# Patient Record
Sex: Male | Born: 1950
Health system: Southern US, Community
[De-identification: ages and names within clinical notes are randomized; demographics above are authoritative.]

## PROBLEM LIST (undated history)

## (undated) ENCOUNTER — Emergency Department (HOSPITAL_COMMUNITY): Admission: EM | Discharge status: Absent without leave | Payer: Medicare HMO

## (undated) DIAGNOSIS — N183 Chronic kidney disease, stage 3 unspecified: Secondary | ICD-10-CM

## (undated) DIAGNOSIS — K852 Alcohol induced acute pancreatitis without necrosis or infection: Secondary | ICD-10-CM

## (undated) DIAGNOSIS — K219 Gastro-esophageal reflux disease without esophagitis: Secondary | ICD-10-CM

## (undated) DIAGNOSIS — I10 Essential (primary) hypertension: Secondary | ICD-10-CM

## (undated) DIAGNOSIS — F17200 Nicotine dependence, unspecified, uncomplicated: Secondary | ICD-10-CM

## (undated) DIAGNOSIS — E118 Type 2 diabetes mellitus with unspecified complications: Secondary | ICD-10-CM

## (undated) DIAGNOSIS — K118 Other diseases of salivary glands: Secondary | ICD-10-CM

## (undated) DIAGNOSIS — K859 Acute pancreatitis without necrosis or infection, unspecified: Secondary | ICD-10-CM

## (undated) HISTORY — PX: LAPAROSCOPIC GASTROTOMY W/ REPAIR OF ULCER: SUR772

---

## 1999-05-31 ENCOUNTER — Ambulatory Visit (HOSPITAL_BASED_OUTPATIENT_CLINIC_OR_DEPARTMENT_OTHER): Admission: RE | Admit: 1999-05-31 | Discharge: 1999-05-31 | Payer: Self-pay | Admitting: Urology

## 2004-09-11 ENCOUNTER — Emergency Department (HOSPITAL_COMMUNITY): Admission: EM | Admit: 2004-09-11 | Discharge: 2004-09-11 | Payer: Self-pay | Admitting: Emergency Medicine

## 2004-09-29 ENCOUNTER — Ambulatory Visit (HOSPITAL_COMMUNITY): Admission: RE | Admit: 2004-09-29 | Discharge: 2004-09-29 | Payer: Self-pay | Admitting: Internal Medicine

## 2004-10-07 ENCOUNTER — Ambulatory Visit: Payer: Self-pay | Admitting: Internal Medicine

## 2004-10-11 ENCOUNTER — Ambulatory Visit: Payer: Self-pay | Admitting: Internal Medicine

## 2004-10-11 ENCOUNTER — Inpatient Hospital Stay (HOSPITAL_COMMUNITY): Admission: EM | Admit: 2004-10-11 | Discharge: 2004-10-25 | Payer: Self-pay | Admitting: Emergency Medicine

## 2004-10-12 HISTORY — PX: ESOPHAGOGASTRODUODENOSCOPY: SHX1529

## 2004-10-16 ENCOUNTER — Ambulatory Visit: Payer: Self-pay | Admitting: Internal Medicine

## 2004-10-24 ENCOUNTER — Ambulatory Visit: Payer: Self-pay | Admitting: Internal Medicine

## 2004-11-03 ENCOUNTER — Inpatient Hospital Stay (HOSPITAL_COMMUNITY): Admission: EM | Admit: 2004-11-03 | Discharge: 2004-11-05 | Payer: Self-pay | Admitting: *Deleted

## 2004-11-10 ENCOUNTER — Ambulatory Visit: Payer: Self-pay | Admitting: Internal Medicine

## 2005-01-05 ENCOUNTER — Ambulatory Visit: Payer: Self-pay | Admitting: Internal Medicine

## 2005-01-27 ENCOUNTER — Ambulatory Visit: Payer: Self-pay | Admitting: Internal Medicine

## 2005-01-27 ENCOUNTER — Ambulatory Visit (HOSPITAL_COMMUNITY): Admission: RE | Admit: 2005-01-27 | Discharge: 2005-01-27 | Payer: Self-pay | Admitting: Internal Medicine

## 2005-01-27 HISTORY — PX: COLONOSCOPY: SHX174

## 2005-02-24 ENCOUNTER — Ambulatory Visit (HOSPITAL_COMMUNITY): Admission: RE | Admit: 2005-02-24 | Discharge: 2005-02-24 | Payer: Self-pay | Admitting: Internal Medicine

## 2006-04-17 ENCOUNTER — Ambulatory Visit (HOSPITAL_COMMUNITY): Admission: RE | Admit: 2006-04-17 | Discharge: 2006-04-17 | Payer: Self-pay | Admitting: Internal Medicine

## 2007-04-01 ENCOUNTER — Emergency Department (HOSPITAL_COMMUNITY): Admission: EM | Admit: 2007-04-01 | Discharge: 2007-04-01 | Payer: Self-pay | Admitting: Emergency Medicine

## 2007-08-15 ENCOUNTER — Ambulatory Visit (HOSPITAL_COMMUNITY): Admission: RE | Admit: 2007-08-15 | Discharge: 2007-08-15 | Payer: Self-pay | Admitting: Orthopaedic Surgery

## 2011-02-17 NOTE — Consult Note (Signed)
NAMEEILEEN, GAGEN                ACCOUNT NO.:  [id number]   MEDICAL RECORD NO.:  [id number]           PATIENT TYPE:   LOCATION:                                 FACILITY:   PHYSICIAN:  R. Garfield Cornea, M.D. DATE OF BIRTH:  [birth year]   DATE OF CONSULTATION:  10/07/2004  DATE OF DISCHARGE:                                   CONSULTATION   CONSULTING PHYSICIAN:  R. Garfield Cornea, M.D.   REFERRING PHYSICIAN:  Tesfaye D. Legrand Rams, M.D.   REASON FOR CONSULTATION:  Abdominal pain, history of peptic ulcer disease.   HISTORY OF PRESENT ILLNESS:  The patient is a 60 year old black male who  presents today for further evaluation of the above stated symptoms.  He has  a long history of intermittent epigastric pain.  For the last month or so,  he has had worsening pain that has become daily.  He denies any modifying or  alleviating factors.  He recalls being on high dose Tagamet in the past  which seemed to help.  He was diagnosed with a peptic ulcer back in the  1990s, by Dr. Glory Buff, in [place], who has since retired, per the patient.  He denies any vomiting.  He does have occasional nausea.  He denies any  heartburn, indigestion, dysphagia, odynophagia.  His bowels move fairly  regularly.  The last couple of days they have been on the loose side.  His  stools were black recently when he took Pepto-Bismol.  They have since been  brown in color.  He occasionally sees bright red blood per rectum.  He has  never had a colonoscopy.  He has lost over 10 pounds in the last three  weeks.  He also complains of urinary frequency and nocturia.  He denies any  dysuria or gross hematuria.  Recently he was seen in the ED.  He had normal  CBC, LFTs, and lipase.  His urinalysis revealed trace blood.  His random  glucose was 138.  He has had an abdominal ultrasound which was negative.   CURRENT MEDICATIONS:  1.  Hyoscyamine 0.125 mg t.i.d.  2.  Prilosec OTC p.r.n.  3.  Tagamet p.r.n.  4.  Promethazine  25 mg p.r.n.  5.  Hydrocodone/APAP 5/500 p.r.n.  6.  Excedrin 2-3 times weekly p.r.n. migraine headaches.   ALLERGIES:  No known drug allergies.   PAST MEDICAL HISTORY:  1.  Remote history of peptic ulcer disease.  2.  Migraine headaches.   PAST SURGICAL HISTORY:  He states he had a cyst removed from his left  lower quadrant region.  On examination, he has a vertical incision in the  midline.   FAMILY HISTORY:  Negative for chronic GI illnesses, colorectal cancer.  Mother has diabetes mellitus.   SOCIAL HISTORY:  He is married, has four children.  He is employed with [employer].  He smokes one and a half packs of cigarettes daily.  He drinks  alcohol daily.  He is drinking 2 pints of wine each day.  He denies any  current illicit drug use but has smoked  marijuana in the past.   REVIEW OF SYSTEMS:  See HPI for GI.  CARDIOPULMONARY:  Denies any chest pain  or shortness of breath.  See HPI for GU.   PHYSICAL EXAMINATION:  VITAL SIGNS:  Weight 192.5, temp 98.1, blood pressure  138/90, pulse 84.  GENERAL:  A pleasant, well nourished, well developed, black male in no acute  distress.  SKIN:  Warm and dry, no jaundice.  HEENT:  Pupils equal, round, reactive to light.  Conjunctivae are pink.  Sclerae nonicteric.  Oropharyngeal mucosa moist and pink, no lesions,  erythema, or exudate.  He has palpable soft tissue mass in the left neck  region just underneath the jaw line, near the left ear.  He has similar  findings on the right, however, definitely enlarged on the left, is tender  to palpation, questionable lymph node versus parotid gland, or other.  CHEST:  Lungs are clear to auscultation.  CARDIAC:  Reveals a regular rate and rhythm.  Normal S1 S2.  No murmurs,  rubs or gallops.  ABDOMEN:  Positive bowel sounds.  Soft, nondistended.  He has mild  epigastric tenderness to deep palpation.  He has a well healed midline  incisional scar.  No organomegaly or masses, although liver  edge is palpable  just below the right costal margin.  The midclavicular line is smooth and  nontender.  No abdominal hernias.  EXTREMITIES:  No edema.   IMPRESSION:  1.  The patient is a 60 year old gentleman with a history of peptic ulcer      disease who presents with a 1-2 month history of worsening epigastric      pain with intermittent nausea and weight loss.  He consumes a moderate      amount of Excedrin as well as a significant amount of alcohol daily.      Would be concerned about recurrent peptic ulcer disease.  In addition,      he has intermittent hematochezia which may be due to a benign anorectal      source.  He needs to have a colonoscopy for further evaluation.  2.  He also complains of polyuria, nocturia.  It was noted that he had trace      blood on recent urinalysis as well as elevated random blood glucose of      138.  Given family history, need to consider diabetes mellitus as well.   PLAN:  1.  EGD and colonoscopy in the near future.  I discussed the risks,      alternatives, and benefits with regards to possibilities of reactions to      medications, bleeding, infection, or perforation with the patient, and      he is agreeable to proceed.  2.  Urinalysis.  3.  Fasting blood glucose.     Lesl  LL/MEDQ  D:  10/07/2004  T:  10/07/2004  Job:  [id number]   cc:   Tesfaye D. Legrand Rams, [address]  [place]  [place] [address]  Fax: [phone]

## 2011-02-17 NOTE — Group Therapy Note (Signed)
NAMESTEDMAN, TORI                ACCOUNT NO.:  [id number]   MEDICAL RECORD NO.:  [id number]          PATIENT TYPE:  INP   LOCATION:  A308                          FACILITY:  [facility]   PHYSICIAN:  Estill Bamberg. Karie Kirks, M.D.DATE OF BIRTH:  [birth year]   DATE OF PROCEDURE:  DATE OF DISCHARGE:                                   PROGRESS NOTE   SUBJECTIVE:  He really says he is doing pretty well.  He really has not had  much pain.  He did take some IV Dilaudid today, but it is really more to  prevent pain later on, because he is not having any real pain now.  He has  just started the clear liquids and is tolerating them.   OBJECTIVE:  VITAL SIGNS:  Temperature 98.2, pulse 82, respiratory rate 18,  blood pressure 132/74.  GENERAL APPEARANCE:  He is semirecumbent in bed, in no acute distress, well  developed, well nourished, oriented and alert.  HEART:  Regular rhythm without murmur, rate of 80.  LUNGS:  Clear throughout, moving air well.  ABDOMEN:  Soft without hepatosplenomegaly or mass.  Just minimal epigastric  discomfort.  EXTREMITIES:  There is no edema in the ankles.   Today the white cell count is 11,600, H&H 15.2 and 43.9.  Met-7 shows a  sodium of 129, glucose 103.  Amylase was 628, up from 578 yesterday.  Lipase  127, down from 142 yesterday.   ASSESSMENT:  Pancreatitis.   PLAN:  Reviewed the note from Dr. Melony Overly, gastroenterologist.  He is just  being started on clear liquids today and off the ice chips.  If he continues  to do well, he will be able to be advanced on his diet and discharged.  Otherwise Dr. Melony Overly makes note that he may need to be transferred to a  tertiary care center.      SDK/MEDQ  D:  10/16/2004  T:  10/16/2004  Job:  [id number]

## 2011-02-17 NOTE — Discharge Summary (Signed)
Dylan Cline, Dylan Cline                ACCOUNT NO.:  [id number]   MEDICAL RECORD NO.:  [id number]          PATIENT TYPE:  INP   LOCATION:  A321                          FACILITY:  [facility]   PHYSICIAN:  Tesfaye D. Legrand Rams, MD   DATE OF BIRTH:  04-02-1951   DATE OF ADMISSION:  11/02/2004  DATE OF DISCHARGE:  02/04/2006LH                                 DISCHARGE SUMMARY   DISCHARGE DIAGNOSES:  1.  Chronic pancreatitis.  2.  History of peptic ulcer disease.  3.  Hypertension.   DISCHARGE MEDICATIONS:  1.  Protonix 40 mg p.o. daily.  2.  Pancrease 3 tablets p.o. t.i.d. with meals.  3.  Lisinopril 10 mg p.o. daily.   DISPOSITION:  The patient was discharged to home in stable condition.   HOSPITAL COURSE:  This is a 60 year old male patient who came to the  emergency room due to recurrent abdominal pain and nausea and vomiting.  The  patient was recently seen by a gastroenterologist and was planned for a CT  scan of the abdomen.  Following the CT scan test the patient started having  more symptoms of nausea and vomiting.  He had severe abdominal pain.  His CT  scan as an outpatient showed an ill-defined pancreatic mass.  He was  admitted for further evaluation.  During the hospital stay his liver  function, amylase, and lipase was slightly elevated.  He had a persistent  pancreatic mass.  A repeat CT scan was done and it showed some increase in  the size of the mass.  He was then referred to [facility] for an  endoscopic ultrasound.  He was evaluated at [facility] and a  pancreatic cyst was aspirated.  The patient improved after the procedure.  His pain was much better.  His enzymes came into normal limits.  The patient  was then discharged home to continue his regular treatment.      TDF/MEDQ  D:  11/23/2004  T:  11/23/2004  Job:  [id number]

## 2011-02-17 NOTE — H&P (Signed)
NAMEGERMAN, CHAKRABORTY                ACCOUNT NO.:  [id number]   MEDICAL RECORD NO.:  [id number]          PATIENT TYPE:  INP   LOCATION:  A321                          FACILITY:  [facility]   PHYSICIAN:  Tesfaye D. Legrand Rams, MD   DATE OF BIRTH:  Jul 10, 1951   DATE OF ADMISSION:  11/02/2004  DATE OF DISCHARGE:  LH                                HISTORY & PHYSICAL   CHIEF COMPLAINT:  Abdominal pain, nausea and vomiting.   HISTORY OF PRESENT ILLNESS:  This is a 60 year old male patient, known case  of chronic pancreatitis and hypertension, who came to emergency room with  above complaints.  The patient was discharged from this hospital about a  week ago after he was treated for exacerbation of pancreatitis.  During the  hospital stay, the patient had recurrent nausea and vomiting and abdominal  pain.  His CT scan showed pancreatic mass.  The patient was referred to  [facility], where EUS was done and the mass was aspirated.  His  amylase and lipase improved and he was clinically better.  The patient was  discharged to home to continue his treatment on follow-up.  The patient was  advised to stop alcohol.  Since the discharge, the patient felt better for  the first three days.  He then started having intermittent abdominal pain.  Yesterday the patient had a unbearable pain the whole day.  He was vomiting  several times.  The patient was then brought back to emergency room and was  started on IV fluid and IV pain medications.  The patient was admitted for  further treatment.  The patient denies any alcohol since he was discharged  from the hospital.   PAST MEDICAL HISTORY:  1.  Pancreatitis.  2.  Hypertension.  3.  Migraine headache.  4.  History of peptic ulcer disease.   CURRENT MEDICATIONS:  1.  Lisinopril 10 mg daily.  2.  Lortab 5/500 mg one tablet p.o. q.6h. p.r.n.  3.  Levsin 0.125 mg one tablet p.o. q.6h. p.r.n.   SOCIAL HISTORY:  The patient is married.  He has a history of  alcohol and  tobacco use.  No history of substance abuse.   PHYSICAL EXAMINATION:  GENERAL:  The patient is alert, awake and sick-  looking.  VITAL SIGNS:  Blood pressure 144/86, pulse 112, respiratory rate 18,  temperature 98.4 degrees Fahrenheit.  HEENT:  Pupils are equal, reactive.  NECK:  Supple.  CHEST:  Decreased air entry, few rhonchi.  CARDIOVASCULAR:  First and second heart sound heard.  No murmur, no gallop.  ABDOMEN:  Soft and lax.  There is a right, left upper quadrant and  epigastric area tenderness.  EXTREMITIES:  No leg edema.   LABORATORY DATA:  WBC 9.7, hemoglobin 14.6, hematocrit 42.7, and platelets  423.  Sodium 135, potassium 3.0, chloride 102, carbon dioxide 27, glucose  112, creatinine 1.2, bilirubin 0.4, AST 18, ALT 18, total protein 6.4,  albumin 3.4, calcium 8.4.  Magnesium 1.8.  Amylase 26, lipase 78.  Urinalysis is essentially negative.   ASSESSMENT:  This is  a 60 year old male patient with a history of chronic  pancreatitis, who was recently discharged after he was treated for  exacerbation of pancreatitis.  Came with complaint of nausea and vomiting  and abdominal pain.  He had severe abdominal pain with recurrent vomiting.  The patient's labs, including his lipase and amylase, are within normal  limits except for low potassium.  His clinical picture seems he is having an  exacerbation of the pancreatitis.   PLAN:  Will keep the patient n.p.o.  Will continue on IV fluid.  Continue  pain medications.  We will continue to monitor his electrolytes and amylase  and lipase.  Will do GI consult and continue his regular treatment.      TDF/MEDQ  D:  11/03/2004  T:  11/03/2004  Job:  [id number]

## 2011-02-17 NOTE — Op Note (Signed)
Dylan Cline, Dylan Cline                ACCOUNT NO.:  [id number]   MEDICAL RECORD NO.:  [id number]          PATIENT TYPE:  INP   LOCATION:  A308                          FACILITY:  [facility]   PHYSICIAN:  R. Garfield Cornea, M.D. DATE OF BIRTH:  1951-09-18   DATE OF PROCEDURE:  10/12/2004  DATE OF DISCHARGE:                                 OPERATIVE REPORT   PROCEDURE:  Esophagogastroduodenoscopy, diagnostic.   INDICATION FOR PROCEDURE:  The patient is a 60 year old gentleman who  consumes alcohol daily, with recent epigastric pain and nausea and vomiting.  CT of the abdomen last evening demonstrated a cystic-appearing mass in his  pancreas.  He takes Excedrin on a regular basis.  EGD is now being done to  further evaluate his symptoms.  His amylase and lipase were normal.  This  approach has been discussed with the patient at length, the potential risks,  benefits, and alternatives have been reviewed, questions answered.  Please  see documentation in the medical record.   PROCEDURE NOTE:  O2 saturation, blood pressure, pulse, and respiration were  monitored throughout the entire procedure.   CONSCIOUS SEDATION:  Versed 2 mg IV, Demerol 50 mg IV in a single dose,  Cetacaine spray for topical oropharyngeal anesthesia.   INSTRUMENT USED:  Olympus video chip system.   FINDINGS:  Examination of the tubular esophagus revealed no mucosal  abnormality.  EG junction easily traversed.   Stomach:  The gastric cavity was empty and insufflated well with air.  A  thorough examination of the gastric mucosa, including retroflexed view of  the proximal stomach and esophagogastric junction, was undertaken.  The  patient had approximately a 3 x 3 cm area of focal intense submucosal  hemorrhage involving the fundal mucosa.  There was no erosion, ulcer, or  infiltrating process seen.  The pylorus was patent and easily traversed.  Examination of the bulb and second portion revealed no abnormalities.   Therapeutic/diagnostic maneuvers performed:  None.   The patient tolerated the procedure well, was reacted in endoscopy.   IMPRESSION:  1.  Normal esophagus.  2.  Focal area of submucosal petechial hemorrhage in the fundus (likely a      trauma-induced phenomenon secondary to vomiting), otherwise normal      stomach, normal D1, D2.   These findings do not explain his abdominal pain.   I reviewed the CT scan with Dr. Mariea Clonts.  He does indeed appear to have a  lesion in his pancreas, which may represent focal pancreatitis or  neoplasm.   RECOMMENDATIONS:  Check a CA19-9 tumor marker.  If it is elevated, would  favor proceeding with an FNA CT-guided biopsy.  If not, he will still need  further evaluation with perhaps endoscopic ultrasound as an outpatient in  the near future.  Further recommendations to follow.     RJerilynn Mages   RMR/MEDQ  D:  10/12/2004  T:  10/12/2004  Job:  [id number]   cc:   Tesfaye D. Legrand Rams, [address]  [place]  [place] [address]  Fax: [phone]

## 2011-02-17 NOTE — Op Note (Signed)
Dylan Cline, Dylan Cline                ACCOUNT NO.:  [id number]   MEDICAL RECORD NO.:  [id number]          PATIENT TYPE:  AMB   LOCATION:  DAY                           FACILITY:  [facility]   PHYSICIAN:  R. Garfield Cornea, M.D. DATE OF BIRTH:  05-Nov-1950   DATE OF PROCEDURE:  01/27/2005  DATE OF DISCHARGE:                                 OPERATIVE REPORT   PROCEDURE:  Diagnostic colonoscopy.   INDICATION FOR PROCEDURE:  The patient is a 60 year old gentleman with  intermittent blood per rectum.  He also has some blood in his semen and a  history of chronic calcific pancreatitis with pseudocyst formation.  He is  due for a CT of the pancreas next month.  Colonoscopy is now being done to  further evaluate hematochezia.  This approach has been discussed with the  patient at length, the potential risks, benefits, and alternatives have been  reviewed, questions answered.  Please see my dictation for more information.   PROCEDURE NOTE:  O2 saturation, blood pressure, pulse, and respiration were  monitored throughout the entire procedure.   CONSCIOUS SEDATION:  IV Versed and Demerol in incremental doses.   INSTRUMENT USED:  Olympus video chip system.   FINDINGS:  Digital rectal exam revealed no abnormalities.  Endoscopic  findings:  The prep was marginal.   Rectum:  Examination of the rectum including retroflexed view of the anal  verge revealed no abnormalities aside from some internal hemorrhoids.   Colon:  The colonic mucosa was surveyed from the rectosigmoid junction  through the left, transverse and right colon to the area of the appendiceal  orifice and the ileocecal valve and cecum.  These structures were well-seen  and photographed for the record.  From this level the scope was slowly  withdrawn and all previously-mentioned mucosal surfaces were again seen.  There was liquid, pasty stool lining the colonic mucosa intermittently  throughout the entirety of its length that had to be  washed away and  suctioned to get adequate visualization of colonic mucosa.  From the level  of the cecum and ileocecal valve, the scope was slowly withdrawn and all  previously-mentioned mucosal surfaces were again seen.  The colonic mucosa  appeared normal.  The patient tolerated the procedure well and was reacted  in endoscopy.   IMPRESSION:  1.  Internal hemorrhoids, otherwise normal rectum.  2.  Normal colon, marginal prep made the exam more difficult.  3.  I suspect the patient has bled from hemorrhoids.   RECOMMENDATIONS:  1.  Hemorrhoid literature provided to Dylan Cline.  2.  A 10-day course of Anusol-HC suppositories one per rectum at bedtime.      He is to let me know if he has any persisting rectal bleeding.  He has      reported also blood in his semen recently.  I have urged him to follow      up with Dr. Legrand Rams in regard to further workup.  3.  He is to return for a CT scan of the pancreas with pancreatic protocol      next month  as scheduled.      RMR/MEDQ  D:  01/27/2005  T:  01/27/2005  Job:  [id number]   cc:   Tesfaye D. Legrand Rams, [place]  [place]  [place] [address]  Fax: [phone]

## 2011-02-17 NOTE — H&P (Signed)
Cline, Dylan                ACCOUNT NO.:  [id number]   MEDICAL RECORD NO.:  [id number]          PATIENT TYPE:  INP   LOCATION:  A308                          FACILITY:  [facility]   PHYSICIAN:  Tesfaye D. Legrand Rams, MD   DATE OF BIRTH:  25-Jun-1951   DATE OF ADMISSION:  10/11/2004  DATE OF DISCHARGE:  LH                                HISTORY & PHYSICAL   CHIEF COMPLAINT:  Abdominal pain, nausea and vomiting.   HISTORY OF PRESENT ILLNESS:  This is a 60 year old male patient with a  history of peptic ulcer disease who came to the emergency room due to nausea  and vomiting of two days' duration.  The patient had worsening epigastric  pain and nausea and vomiting with weight loss for the last two months.  He  was seen in GI office recently.  He called and got an order for a CT scan of  the abdomen yesterday.  After he had the CT scan, the patient was taken to  the emergency room due to abdominal pain.  His CT scan showed ill-defined  pancreatic mass; however, his blood tests, including amylase and lipase,  were within normal limits.  The patient was admitted for his symptoms.   REVIEW OF SYSTEMS:  Patient has no fever, chills, chest pain, cough,  shortness of breath, dysuria, urgency, or frequency of urination.   PAST MEDICAL HISTORY:  1.  Peptic ulcer disease.  2.  Migraine.   CURRENT MEDICATIONS:  1.  Nexium 40 mg p.o. q.d.  2.  Levsin 0.125 mg p.o. t.i.d.  3.  Lortab 5/500 1 tablet q.6h. p.r.n.  4.  Excedrin 2-3 tablets weekly p.r.n. for migraine headache.   SOCIAL HISTORY:  The patient is married.  He has four children.  Patient is  employed full time.  He smokes about a half-pack of cigarettes per day.  The  patient drinks alcohol almost every day.   PHYSICAL EXAMINATION:  VITAL SIGNS:  Blood pressure 152/81, pulse 75,  respiratory rate 20, temperature 97.8 degrees Fahrenheit.  GENERAL:  Patient is awake and alert and sick-looking.  HEENT:  Pupils are equal and reactive.  NECK:  Supple.  CHEST:  Clear lung fields.  Good air entry.  CARDIOVASCULAR:  First and second heart sounds heard.  No murmur or gallop.  ABDOMEN:  Soft and lax.  Bowel sounds are positive.  No mass.  No  organomegaly.  EXTREMITIES:  No leg edema.   LABS:  CBC:  WBC 11.2, hemoglobin 16, hematocrit 45.5, platelets 302.  Sodium 136, potassium 3.6, chloride 101, carbon dioxide 29, glucose 115, BUN  8, creatinine 1.3, bilirubin 0.6, alkaline phosphatase 59, AST 17, ALT 20,  total protein 6.3, albumin 3.8, calcium 6.9, amylase 119, lipase 39.   CT scan of the abdomen showed 1.5 cm cystic area in the pancreatic body.   ASSESSMENT:  This is a 60 year old male patient with a history of peptic  ulcer disease and migraine admitted due to nausea, vomiting, and abdominal  pain.  He has some cystic mass on his pancreatic body.  His liver  functions  as well as amylase and lipase are within normal limits.  The cause of his  symptoms are not clear at this time.   PLAN:  Will do a GI consult.  Will continue the patient on pain management.  Will give him Phenergan as needed.  Will continue the patient on proton pump  inhibitor.      TDF/MEDQ  D:  10/12/2004  T:  10/12/2004  Job:  [id number]

## 2011-02-17 NOTE — H&P (Signed)
NAMEBRANDEN, Cline                ACCOUNT NO.:  [id number]   MEDICAL RECORD NO.:  [id number]          PATIENT TYPE:  AMB   LOCATION:  DAY                           FACILITY:  [facility]   PHYSICIAN:  R. Garfield Cornea, M.D. DATE OF BIRTH:  May 19, 1951   DATE OF ADMISSION:  DATE OF DISCHARGE:  LH                                HISTORY & PHYSICAL   CHIEF COMPLAINT:  Hematochezia.   [name] is a pleasant 60 year old gentleman who we have been seeing  recently for recurrent pancreatitis with pseudocyst formation.  He was last  seen in the office on November 10, 2004.  He was doing considerably better on  pancreatic enzyme supplements.  The pseudocyst had decreased in size but was  still present.  The plan was to get another CT in May of this year.  He has  also had intermittent low volume hematochezia and reports blood in his semen  as well.   Prior workup included an EUS at [facility], which was done by Dr. Jerene Pitch.  Cytology came back with only fat necrosis.  He has really done well since he  was last seen.  He is not having any diarrhea.  The abdominal pain has  improved significantly.  He has gained 7-1/2 pounds.  He is taking Pancrease  3 tablets with meals and 2 with snacks, but he stopped taking Prilosec some  time ago.  He has a distant history of peptic ulcer disease.  He is a  longstanding, daily consumer of alcohol.  He has cut back recently but  continues to consume alcohol.  He smokes a half pack of cigarettes per day.   PAST MEDICAL HISTORY:  Also significant for migraine headaches.  Remote  history of peptic ulcer disease, for which he has seen Dr. Glory Buff previously.   PAST SURGICAL HISTORY:  Included cyst removal from his left lower abdomen.   CURRENT MEDICATIONS:  1.  Hyoscyamine 0.125 mg daily.  2.  Tagamet p.r.n.  3.  Hydrocodone/APAP 5/500 p.r.n.  4.  Pancrease 3 tablets with meals, 2 with snacks.  5.  Excedrin 2-3 times weekly.   ALLERGIES:  No known drug  allergies.   FAMILY HISTORY:  Negative for chronic GI or liver illness.  Mother has  diabetes.   REVIEW OF SYSTEMS:  As in the history of present illness.  He has not had  any hematemesis, odynophagia, dysphagia, or early satiety, reflux symptoms,  nausea or vomiting.   PHYSICAL EXAMINATION:  VITAL SIGNS:  Today, he looks well. Weight of 187.5  pounds.  Height 5 foot 9-1/2 inches.  Temp 98.2, BP 118/84, pulse 80.  SKIN:  Warm and dry.  HEENT:  No scleral icterus.  NECK:  JVD is not prominent.  CHEST:  Lungs are clear to auscultation.  CARDIAC:  Regular rate and rhythm without murmur, rub or gallop.  ABDOMEN:  Nondistended.  Positive bowel sounds.  Soft, nontender.  No  appreciable mass or hepatosplenomegaly.  EXTREMITIES:  No edema.  RECTAL:  Deferred at the time of colonoscopy.   IMPRESSION:  [name]  is a pleasant 60 year old gentleman with  chronic calcific pancreatitis with recent complication of pseudocyst which  radiographically has been improving.  Clinically, he is doing very well on  pancreatic enzymes; however, he is not taking any acid suppression.  He has  small-volume hematochezia, which needs further evaluation.  He has reported  blood in his semen, which is a separate issue.   RECOMMENDATIONS:  1.  Will proceed with a colonoscopy in the very near future.  The potential      risks, benefits and alternatives have been reviewed with this gentleman.      We will stay on course and plan for a follow-up CT in May, 2006.  2.  I have urged him to follow up with Dr. Legrand Rams in regards to blood in his      semen.      RMR/MEDQ  D:  01/05/2005  T:  01/05/2005  Job:  [id number]   cc:   Tesfaye D. Legrand Rams, [place]  [place]  [place] [address]  Fax: [phone]

## 2011-02-17 NOTE — Group Therapy Note (Signed)
Dylan Cline, Dylan Cline                ACCOUNT NO.:  [id number]   MEDICAL RECORD NO.:  [id number]          PATIENT TYPE:  INP   LOCATION:  A308                          FACILITY:  [facility]   PHYSICIAN:  Estill Bamberg. Karie Kirks, M.D.DATE OF BIRTH:  09/08/1951   DATE OF PROCEDURE:  10/15/2004  DATE OF DISCHARGE:                                   PROGRESS NOTE   SUBJECTIVE:  The patient does feel somewhat better today compared to  yesterday.   OBJECTIVE:  Temperature 99.2, pulse 84, respiratory rate 18, blood pressure  103/59.  Supine bed, oriented, alert, in no acute distress at present, well-  developed, well-nourished.  His lungs are clear throughout.  The heart is a  regular rhythm, rate of about 80.  The abdomen is soft without  hepatosplenomegaly or mass, but there is some mild tenderness in the  epigastrium.  There is no edema of the ankles.  Skin turgor is normal.  Mucous membranes moist.   Today his amylase is 578 up from 576 yesterday and 230 the day before, his  lipase is 142 down from 201 yesterday but at that time up from 78 the day  before.  His abdominal CT did show what appeared to be either focal  pancreatitis or cystic pancreatic neoplasm.   ASSESSMENT:  Presumptive pancreatitis.   PLAN:  Continue IV fluids and just ice chips.  Talked with patient and  family at length.  Recheck tomorrow, maybe he will go on liquids at that  time perhaps with Pancrease.      SDK/MEDQ  D:  10/15/2004  T:  10/15/2004  Job:  [id number]

## 2011-10-28 ENCOUNTER — Emergency Department (HOSPITAL_COMMUNITY)
Admission: EM | Admit: 2011-10-28 | Discharge: 2011-10-28 | Disposition: A | Discharge status: Home / Self Care | Payer: PRIVATE HEALTH INSURANCE | Attending: Emergency Medicine | Admitting: Emergency Medicine

## 2011-10-28 ENCOUNTER — Emergency Department (HOSPITAL_COMMUNITY): Payer: PRIVATE HEALTH INSURANCE

## 2011-10-28 ENCOUNTER — Encounter (HOSPITAL_COMMUNITY): Payer: Self-pay

## 2011-10-28 DIAGNOSIS — J111 Influenza due to unidentified influenza virus with other respiratory manifestations: Secondary | ICD-10-CM

## 2011-10-28 DIAGNOSIS — R509 Fever, unspecified: Secondary | ICD-10-CM | POA: Insufficient documentation

## 2011-10-28 DIAGNOSIS — R05 Cough: Secondary | ICD-10-CM | POA: Insufficient documentation

## 2011-10-28 DIAGNOSIS — F17200 Nicotine dependence, unspecified, uncomplicated: Secondary | ICD-10-CM | POA: Insufficient documentation

## 2011-10-28 DIAGNOSIS — R059 Cough, unspecified: Secondary | ICD-10-CM | POA: Insufficient documentation

## 2011-10-28 DIAGNOSIS — R110 Nausea: Secondary | ICD-10-CM | POA: Insufficient documentation

## 2011-10-28 DIAGNOSIS — R070 Pain in throat: Secondary | ICD-10-CM | POA: Insufficient documentation

## 2011-10-28 DIAGNOSIS — IMO0001 Reserved for inherently not codable concepts without codable children: Secondary | ICD-10-CM | POA: Insufficient documentation

## 2011-10-28 DIAGNOSIS — M549 Dorsalgia, unspecified: Secondary | ICD-10-CM | POA: Insufficient documentation

## 2011-10-28 DIAGNOSIS — I10 Essential (primary) hypertension: Secondary | ICD-10-CM | POA: Insufficient documentation

## 2011-10-28 DIAGNOSIS — R51 Headache: Secondary | ICD-10-CM | POA: Insufficient documentation

## 2011-10-28 HISTORY — DX: Essential (primary) hypertension: I10

## 2011-10-28 LAB — BASIC METABOLIC PANEL
BUN: 8 mg/dL (ref 6–23)
CO2: 20 mEq/L (ref 19–32)
Calcium: 9.2 mg/dL (ref 8.4–10.5)
Chloride: 102 mEq/L (ref 96–112)
Creatinine, Ser: 1.14 mg/dL (ref 0.50–1.35)
GFR calc Af Amer: 78 mL/min — ABNORMAL LOW (ref >90)
GFR calc non Af Amer: 68 mL/min — ABNORMAL LOW (ref >90)
Glucose, Bld: 157 mg/dL — ABNORMAL HIGH (ref 70–99)
Potassium: 3.7 mEq/L (ref 3.5–5.1)

## 2011-10-28 LAB — URINALYSIS, ROUTINE W REFLEX MICROSCOPIC
Bilirubin Urine: NEGATIVE
Glucose, UA: NEGATIVE mg/dL
Leukocytes, UA: NEGATIVE
Nitrite: NEGATIVE
Protein, ur: 100 mg/dL — AB
Specific Gravity, Urine: 1.025 (ref 1.005–1.030)
Urobilinogen, UA: 0.2 mg/dL (ref 0.0–1.0)
pH: 6 (ref 5.0–8.0)

## 2011-10-28 LAB — URINE MICROSCOPIC-ADD ON

## 2011-10-28 LAB — CBC
HCT: 46.4 % (ref 39.0–52.0)
Hemoglobin: 16.2 g/dL (ref 13.0–17.0)
MCH: 32.8 pg (ref 26.0–34.0)
MCHC: 34.9 g/dL (ref 30.0–36.0)
MCV: 93.9 fL (ref 78.0–100.0)
Platelets: 218 10*3/uL (ref 150–400)
RBC: 4.94 MIL/uL (ref 4.22–5.81)
RDW: 13.4 % (ref 11.5–15.5)
WBC: 7.9 10*3/uL (ref 4.0–10.5)

## 2011-10-28 LAB — DIFFERENTIAL
Basophils Relative: 1 % (ref 0–1)
Eosinophils Absolute: 0 10*3/uL (ref 0.0–0.7)
Eosinophils Relative: 0 % (ref 0–5)
Lymphocytes Relative: 23 % (ref 12–46)
Lymphs Abs: 1.8 10*3/uL (ref 0.7–4.0)
Monocytes Relative: 7 % (ref 3–12)
Neutro Abs: 5.4 10*3/uL (ref 1.7–7.7)
Neutrophils Relative %: 68 % (ref 43–77)

## 2011-10-28 MED ORDER — OSELTAMIVIR PHOSPHATE 75 MG PO CAPS: 75.0000 mg | ORAL_CAPSULE | Freq: Two times a day (BID) | ORAL | Status: AC

## 2011-10-28 MED ORDER — KETOROLAC TROMETHAMINE 30 MG/ML IJ SOLN
30.0000 mg | Freq: Once | INTRAMUSCULAR | Status: AC
  Administered 2011-10-28: 30 mg via INTRAVENOUS
  Filled 2011-10-28: qty 1

## 2011-10-28 MED ORDER — IBUPROFEN 800 MG PO TABS: 800.0000 mg | ORAL_TABLET | Freq: Three times a day (TID) | ORAL | Status: AC | PRN

## 2011-10-28 MED ORDER — ACETAMINOPHEN 500 MG PO TABS
1000.0000 mg | ORAL_TABLET | Freq: Once | ORAL | Status: AC
  Administered 2011-10-28: 1000 mg via ORAL
  Filled 2011-10-28: qty 2

## 2011-10-28 MED ORDER — OSELTAMIVIR PHOSPHATE 75 MG PO CAPS
75.0000 mg | ORAL_CAPSULE | Freq: Once | ORAL | Status: AC
  Administered 2011-10-28: 75 mg via ORAL
  Filled 2011-10-28: qty 1

## 2011-10-28 MED ORDER — SODIUM CHLORIDE 0.9 % IV SOLN
INTRAVENOUS | Status: DC
  Administered 2011-10-28: 19:00:00 via INTRAVENOUS

## 2011-10-28 MED ORDER — SODIUM CHLORIDE 0.9 % IV BOLUS (SEPSIS)
1000.0000 mL | Freq: Once | INTRAVENOUS | Status: AC
  Administered 2011-10-28: 1000 mL via INTRAVENOUS

## 2011-10-28 NOTE — ED Provider Notes (Signed)
Scribed for Trisha Mangle, MD, the patient was seen in room APA03/APA03 . This chart was scribed by Glory Buff.   CSN: [id number]  Arrival date & time 10/28/11  1515   First MD Initiated Contact with Patient 10/28/11 1552      Chief Complaint  Patient presents with  . Headache  . Cough  . Sore Throat  . Fever    (Consider location/radiation/quality/duration/timing/severity/associated sxs/prior treatment) The history is provided by the patient. No language interpreter was used.   Dylan Cline is a 61 y.o. male who presents to the Emergency Department complaining of 2 days of sudden onset fever with associated sore throat, HA, dry cough, myalgias and nausea. Pt has treated with Nightquil with little improvement. Pt denies receiving flu shot. Pt denies sick contacts.  Denies cp, sob, abd pain.  States that his sx have been getting worse and that he has been unable to control fever at home with nighquil   Past Medical History  Diagnosis Date  . Hypertension     Past Surgical History  Procedure Date  . Laparoscopic gastrotomy w/ repair of ulcer     No family history on file.  History  Substance Use Topics  . Smoking status: Current Everyday Smoker -- 1.0 packs/day  . Smokeless tobacco: Not on file  . Alcohol Use: 0.6 oz/week    1 Glasses of wine per week    Review of Systems  Constitutional: Positive for fever and chills. Negative for activity change, appetite change and fatigue.  HENT: Positive for sore throat. Negative for congestion, rhinorrhea, neck pain and neck stiffness.   Respiratory: Positive for cough. Negative for wheezing.   Cardiovascular: Negative for chest pain and palpitations.  Gastrointestinal: Positive for nausea. Negative for vomiting, abdominal pain and diarrhea.  Genitourinary: Negative for dysuria, urgency, frequency and flank pain.  Musculoskeletal: Positive for myalgias and back pain.  Skin: Negative for rash.  Neurological: Positive for  headaches. Negative for dizziness, weakness, light-headedness and numbness.  All other systems reviewed and are negative.   Allergies  Review of patient's allergies indicates no known allergies.  Home Medications   Current Outpatient Rx  Name Route Sig Dispense Refill  . IBUPROFEN 800 MG PO TABS Oral Take 1 tablet (800 mg total) by mouth every 8 (eight) hours as needed for pain or fever. 30 tablet 0  . OSELTAMIVIR PHOSPHATE 75 MG PO CAPS Oral Take 1 capsule (75 mg total) by mouth every 12 (twelve) hours. 10 capsule 0    BP 166/129  Pulse 133  Temp(Src) 102.7 F (39.3 C) (Oral)  Resp 20  Ht 5\' 9"  (1.753 m)  Wt 189 lb (85.73 kg)  BMI 27.91 kg/m2  SpO2 98%  Physical Exam  Nursing note and vitals reviewed. Constitutional: He is oriented to person, place, and time. He appears well-developed and well-nourished. No distress.  HENT:  Head: Normocephalic and atraumatic.  Eyes: EOM are normal. No scleral icterus.  Neck: Neck supple.  Cardiovascular: Normal rate and regular rhythm.        tachycardic  Pulmonary/Chest: Effort normal and breath sounds normal. No respiratory distress.  Abdominal: Soft. There is no tenderness.  Neurological: He is alert and oriented to person, place, and time.  Skin: Skin is warm and dry.  Psychiatric: He has a normal mood and affect.    ED Course  Procedures (including critical care time) DIAGNOSTIC STUDIES: Oxygen Saturation is 98% on room air, normal by my interpretation.    COORDINATION OF  CARE:   Labs Reviewed  BASIC METABOLIC PANEL - Abnormal; Notable for the following:    Glucose, Bld 157 (*)    GFR calc non Af Amer 68 (*)    GFR calc Af Amer 78 (*)    All other components within normal limits  URINALYSIS, ROUTINE W REFLEX MICROSCOPIC - Abnormal; Notable for the following:    Hgb urine dipstick MODERATE (*)    Ketones, ur TRACE (*)    Protein, ur 100 (*)    All other components within normal limits  CBC  DIFFERENTIAL  URINE  MICROSCOPIC-ADD ON   Dg Chest 2 View  10/28/2011  *RADIOLOGY REPORT*  Clinical Data: Fever and congestion  CHEST - 2 VIEW  Comparison: None  Findings: The heart size and mediastinal contours are within normal limits.  Both lungs are clear.  The visualized skeletal structures are unremarkable.  IMPRESSION: Negative exam.  Original Report Authenticated By: Angelita Ingles, M.D.    ED MEDICATIONS Medications  sodium chloride 0.9 % bolus 1,000 mL (1000 mL Intravenous Given 10/28/11 1659)  oseltamivir (TAMIFLU) capsule 75 mg   acetaminophen (TYLENOL) tablet 1,000 mg (1000 mg Oral Given 10/28/11 1604)  ketorolac (TORADOL) 30 MG/ML injection 30 mg (30 mg Intravenous Given 10/28/11 1659)   7:20 PM Pt recheck. Pt reports feeling improved. Pt vital recheck shows temp improved to 100. Pt is ready to go home. Plan to discharge.   1. Influenza-like illness       MDM  Fever control with Tylenol and Toradol. He applied some ice packs to expedite his fever control. Laboratory studies and chest x-ray unremarkable. He'll be discharged home with Tamiflu where he received the first dose in the emergency department. I feel this is secondary to influenza. He is encouraged to administer Tylenol and ibuprofen every 6 hours by alternating the 2 medications. On discharge from the hospital he is with normal heart rate.   I personally performed the services described in this documentation, which was scribed in my presence. The recorded information has been reviewed and considered.       Trisha Mangle, MD 10/28/11 [phone]

## 2011-10-28 NOTE — ED Notes (Signed)
Pt presents with cough, headache, sore throat, and fever since Friday.

## 2011-10-28 NOTE — ED Notes (Signed)
Pt made a level 2 due to BP of 166/129 and HR 133. Temp of 102.7.

## 2011-10-28 NOTE — ED Notes (Signed)
Pt alert & oriented x4, stable gait. Pt given discharge instructions, paperwork & prescription(s), pt verbalized understanding. Pt left department w/ no further questions.  

## 2013-05-24 ENCOUNTER — Emergency Department (HOSPITAL_COMMUNITY): Payer: PRIVATE HEALTH INSURANCE

## 2013-05-24 ENCOUNTER — Encounter (HOSPITAL_COMMUNITY): Payer: Self-pay | Admitting: *Deleted

## 2013-05-24 ENCOUNTER — Emergency Department (HOSPITAL_COMMUNITY)
Admission: EM | Admit: 2013-05-24 | Discharge: 2013-05-25 | Disposition: A | Discharge status: Home / Self Care | Payer: PRIVATE HEALTH INSURANCE | Attending: Emergency Medicine | Admitting: Emergency Medicine

## 2013-05-24 DIAGNOSIS — R7309 Other abnormal glucose: Secondary | ICD-10-CM | POA: Insufficient documentation

## 2013-05-24 DIAGNOSIS — F17200 Nicotine dependence, unspecified, uncomplicated: Secondary | ICD-10-CM | POA: Insufficient documentation

## 2013-05-24 DIAGNOSIS — R110 Nausea: Secondary | ICD-10-CM | POA: Insufficient documentation

## 2013-05-24 DIAGNOSIS — Z79899 Other long term (current) drug therapy: Secondary | ICD-10-CM | POA: Insufficient documentation

## 2013-05-24 DIAGNOSIS — R739 Hyperglycemia, unspecified: Secondary | ICD-10-CM

## 2013-05-24 DIAGNOSIS — I10 Essential (primary) hypertension: Secondary | ICD-10-CM | POA: Insufficient documentation

## 2013-05-24 DIAGNOSIS — K5900 Constipation, unspecified: Secondary | ICD-10-CM | POA: Insufficient documentation

## 2013-05-24 DIAGNOSIS — K859 Acute pancreatitis without necrosis or infection, unspecified: Secondary | ICD-10-CM | POA: Insufficient documentation

## 2013-05-24 LAB — CBC WITH DIFFERENTIAL/PLATELET
Basophils Absolute: 0.1 10*3/uL (ref 0.0–0.1)
Basophils Relative: 1 % (ref 0–1)
Eosinophils Relative: 3 % (ref 0–5)
HCT: 45.5 % (ref 39.0–52.0)
Hemoglobin: 16.1 g/dL (ref 13.0–17.0)
Lymphocytes Relative: 52 % — ABNORMAL HIGH (ref 12–46)
MCHC: 35.4 g/dL (ref 30.0–36.0)
MCV: 95.8 fL (ref 78.0–100.0)
Monocytes Absolute: 0.7 10*3/uL (ref 0.1–1.0)
Monocytes Relative: 7 % (ref 3–12)
Neutro Abs: 3.6 10*3/uL (ref 1.7–7.7)
RDW: 12.6 % (ref 11.5–15.5)

## 2013-05-24 LAB — URINALYSIS, ROUTINE W REFLEX MICROSCOPIC
Bilirubin Urine: NEGATIVE
Ketones, ur: NEGATIVE mg/dL
Leukocytes, UA: NEGATIVE
Nitrite: NEGATIVE
Protein, ur: NEGATIVE mg/dL
Urobilinogen, UA: 0.2 mg/dL (ref 0.0–1.0)

## 2013-05-24 LAB — LIPASE, BLOOD: Lipase: 164 U/L — ABNORMAL HIGH (ref 11–59)

## 2013-05-24 LAB — COMPREHENSIVE METABOLIC PANEL
BUN: 18 mg/dL (ref 6–23)
CO2: 25 mEq/L (ref 19–32)
Calcium: 9.4 mg/dL (ref 8.4–10.5)
Chloride: 96 mEq/L (ref 96–112)
Creatinine, Ser: 1.2 mg/dL (ref 0.50–1.35)
GFR calc non Af Amer: 63 mL/min — ABNORMAL LOW (ref >90)
Total Bilirubin: 0.2 mg/dL — ABNORMAL LOW (ref 0.3–1.2)

## 2013-05-24 LAB — TROPONIN I: Troponin I: <0.3 ng/mL (ref <0.30)

## 2013-05-24 MED ORDER — SODIUM CHLORIDE 0.9 % IV BOLUS (SEPSIS)
1000.0000 mL | Freq: Once | INTRAVENOUS | Status: AC
  Administered 2013-05-24: 1000 mL via INTRAVENOUS

## 2013-05-24 MED ORDER — PANTOPRAZOLE SODIUM 40 MG IV SOLR
40.0000 mg | Freq: Once | INTRAVENOUS | Status: AC
  Administered 2013-05-24: 40 mg via INTRAVENOUS
  Filled 2013-05-24: qty 40

## 2013-05-24 MED ORDER — HYDROMORPHONE HCL PF 1 MG/ML IJ SOLN
1.0000 mg | Freq: Once | INTRAMUSCULAR | Status: AC
  Administered 2013-05-24: 1 mg via INTRAVENOUS
  Filled 2013-05-24: qty 1

## 2013-05-24 MED ORDER — FAMOTIDINE IN NACL 20-0.9 MG/50ML-% IV SOLN
20.0000 mg | Freq: Once | INTRAVENOUS | Status: AC
  Administered 2013-05-24: 20 mg via INTRAVENOUS
  Filled 2013-05-24: qty 50

## 2013-05-24 MED ORDER — ONDANSETRON HCL 4 MG/2ML IJ SOLN
4.0000 mg | Freq: Once | INTRAMUSCULAR | Status: AC
  Administered 2013-05-24: 4 mg via INTRAVENOUS
  Filled 2013-05-24: qty 2

## 2013-05-24 MED ORDER — IOHEXOL 300 MG/ML  SOLN
50.0000 mL | Freq: Once | INTRAMUSCULAR | Status: AC | PRN
  Administered 2013-05-24: 50 mL via ORAL

## 2013-05-24 MED ORDER — IOHEXOL 300 MG/ML  SOLN
100.0000 mL | Freq: Once | INTRAMUSCULAR | Status: AC | PRN
  Administered 2013-05-24: 100 mL via INTRAVENOUS

## 2013-05-24 NOTE — ED Provider Notes (Signed)
CSN: [id number]     Arrival date & time 05/24/13  2049 History  This chart was scribed for Babette Relic, MD, by Neta Ehlers, ED Scribe. This patient was seen in room APA03/APA03 and the patient's care was started at 9:01 PM.   First MD Initiated Contact with Patient 05/24/13 2058     Chief Complaint  Patient presents with  . Abdominal Pain  . Constipation   The history is provided by the patient. No language interpreter was used.   HPI Comments: Dylan Cline is a 62 y.o. male, with a h/o pancreatitis, who presents to the Emergency Department complaining of gradual-onset, constant, and diffuse upper abdominal pain which has been occurring for three weeks with associated nausea, decreased appetite, and constipation. He reports that this current pain is similar to previous episodes of pancreatitis. He reports losing approximately 20 pounds in the past few weeks due to a diminished appetite. The pt experiences small, hard stool every couple of days, and he has attempted to treat his constipation with an OTC laxative. He reports that movement, palpation, and eating increases the abdominal pain. The pt denies any emesis, diarrhea, or hematochezia. He also denies SOB, chest pain, or syncope. The pt states that he has not had abdominal pain for the previous few years until this current episode began. The pt states he still drinks alcohol.   Past Medical History  Diagnosis Date  . Hypertension    Past Surgical History  Procedure Laterality Date  . Laparoscopic gastrotomy w/ repair of ulcer     History reviewed. No pertinent family history. History  Substance Use Topics  . Smoking status: Current Every Day Smoker -- 1.00 packs/day  . Smokeless tobacco: Not on file  . Alcohol Use: No    Review of Systems  A complete 10 system review of systems was obtained, and all systems were negative except where indicated in the HPI and PE.   Allergies  Review of patient's allergies indicates no  known allergies.  Home Medications   Current Outpatient Rx  Name  Route  Sig  Dispense  Refill  . omeprazole (PRILOSEC OTC) 20 MG tablet   Oral   Take 20 mg by mouth daily.         . metoCLOPramide (REGLAN) 10 MG tablet   Oral   Take 1 tablet (10 mg total) by mouth every 6 (six) hours as needed (nausea/headache).   6 tablet   0   . ondansetron (ZOFRAN ODT) 8 MG disintegrating tablet      8mg  ODT q4 hours prn nausea   4 tablet   0   . oxyCODONE-acetaminophen (PERCOCET) 5-325 MG per tablet   Oral   Take 2 tablets by mouth every 6 (six) hours as needed for pain. Dispense to go.   6 tablet   0   . oxyCODONE-acetaminophen (PERCOCET) 5-325 MG per tablet   Oral   Take 2 tablets by mouth every 6 (six) hours as needed for pain.   15 tablet   0     Triage Vitals: BP 160/105  Pulse 83  Temp(Src) 98.8 F (37.1 C) (Oral)  Resp 20  Ht 5\' 9"  (1.753 m)  Wt 196 lb (88.905 kg)  BMI 28.93 kg/m2  SpO2 100%  Physical Exam  Nursing note and vitals reviewed. Constitutional:  Awake, alert, nontoxic appearance.  HENT:  Head: Atraumatic.  Eyes: Right eye exhibits no discharge. Left eye exhibits no discharge.  Neck: Neck supple.  Cardiovascular:  Normal rate, regular rhythm and normal heart sounds.   No murmur heard. Pulmonary/Chest: Effort normal and breath sounds normal. No respiratory distress. He exhibits no tenderness.  Abdominal: Soft. Bowel sounds are normal. There is tenderness. There is no rebound.  Entire upper abdomen mild to moderate tenderness with no rebound. No tenderness to lower abdomen. Bowel sounds present.  Genitourinary: No penile tenderness.  Testicles non-tender. No palpable hernias.  Rectal exam was non-tender. Light brown stool. No apparent fecal impaction. Heme negative.   Musculoskeletal: He exhibits no tenderness.  Baseline ROM, no obvious new focal weakness.  Neurological: He is alert.  Mental status and motor strength appears baseline for  patient and situation.  Skin: No rash noted.  Psychiatric: He has a normal mood and affect.    ED Course   DIAGNOSTIC STUDIES:  Oxygen Saturation is 100% on room air, normal by my interpretation.    COORDINATION OF CARE:  9:09 PM- Patient understands and agrees with initial ED impression and plan with expectations set for ED visit.  Pt stable in ED with no significant deterioration in condition.Patient / Family / Caregiver informed of clinical course, understand medical decision-making process, and agree with plan.  Procedures (including critical care time) ECG:  Normal sinus rhythm, ventricular rate 73, normal axis left ventricular hypertrophy, no acute ischemic changes noted, no significant change noted compared to June 2008  Labs Reviewed  CBC WITH DIFFERENTIAL - Abnormal; Notable for the following:    Neutrophils Relative % 37 (*)    Lymphocytes Relative 52 (*)    Lymphs Abs 5.1 (*)    All other components within normal limits  COMPREHENSIVE METABOLIC PANEL - Abnormal; Notable for the following:    Sodium 130 (*)    Glucose, Bld 227 (*)    AST 41 (*)    Total Bilirubin 0.2 (*)    GFR calc non Af Amer 63 (*)    GFR calc Af Amer 73 (*)    All other components within normal limits  LIPASE, BLOOD - Abnormal; Notable for the following:    Lipase 164 (*)    All other components within normal limits  URINALYSIS, ROUTINE W REFLEX MICROSCOPIC - Abnormal; Notable for the following:    Hgb urine dipstick TRACE (*)    All other components within normal limits  TROPONIN I  URINE MICROSCOPIC-ADD ON   Ct Abdomen Pelvis W Contrast  05/24/2013   CLINICAL DATA:  Upper abdominal pain. Previous pancreatitis and ulcer surgery.  EXAM: CT ABDOMEN AND PELVIS WITH CONTRAST  TECHNIQUE: Multidetector CT imaging of the abdomen and pelvis was performed using the standard protocol following bolus administration of intravenous contrast.  CONTRAST:  14mL OMNIPAQUE IOHEXOL 300 MG/ML SOLN, 16mL  OMNIPAQUE IOHEXOL 300 MG/ML SOLN  COMPARISON:  Previous examinations, the most recent dated 02/24/2005.  FINDINGS: Interval mild diffuse enlargement of the pancreatic head with mild surrounding edema. No discrete fluid collections. Further decrease in size and density of the previously demonstrated area of low density in the distal body and proximal tail of the pancreas. This previously measured 2.6 x 1.0 cm and currently measures 2.8 x 0.4 cm and corresponding dimensions on image number 23.  Mild diffuse low density of the liver relative to the spleen. Normal appearing appendix. Stable small medial segment left lobe liver cyst. Scattered colonic diverticula without evidence of diverticulitis. Bilateral proximal inguinal hernias containing fat. Retroperitoneal surgical clips. On image number 37, there are bilateral ventral hernias containing fat. There is also a  ventral hernia containing fat to the left of midline on image number 43. These are all above the umbilicus.  Unremarkable spleen, gallbladder, adrenal glands, kidneys and urinary bladder. Small coarse prostatic calcification. No enlarged lymph nodes. Clear lung bases. Lumbar and lower thoracic spine degenerative changes.  IMPRESSION: 1. Changes of acute pancreatitis involving the head of the pancreas. 2. Further decrease in size of a small post pancreatitis fluid collection in the distal body and proximal tail of the pancreas. 3. Stable ventral hernias containing fat. 4. Proximal bilateral inguinal hernias containing fat. 5. Mild diffuse hepatic steatosis. 6. Colonic diverticulosis.   Electronically Signed   By: Enrique Sack   On: 05/24/2013 23:35   1. Pancreatitis   2. Hyperglycemia     MDM  I doubt any other EMC precluding discharge at this time including, but not necessarily limited to the following:peritonitis, SBI.  I personally performed the services described in this documentation, which was scribed in my presence. The recorded information has  been reviewed and is accurate.    Babette Relic, MD 05/25/13 1249

## 2013-05-24 NOTE — ED Notes (Addendum)
Pt c/o abdominal pain x 2 wks. Denies n/v or urinary symptoms but does state that he is having a hard time having a bowel movement without a laxative. Last bowel movement was a "few hours ago."  Pt thinks he has lost 25lbs in 2 wks.

## 2013-05-25 MED ORDER — ONDANSETRON 8 MG PO TBDP: ORAL_TABLET | ORAL | Status: DC

## 2013-05-25 MED ORDER — OXYCODONE-ACETAMINOPHEN 5-325 MG PO TABS: 2.0000 | ORAL_TABLET | Freq: Four times a day (QID) | ORAL | Status: DC | PRN

## 2013-05-25 MED ORDER — METOCLOPRAMIDE HCL 10 MG PO TABS: 10.0000 mg | ORAL_TABLET | Freq: Four times a day (QID) | ORAL | Status: DC | PRN

## 2013-06-10 ENCOUNTER — Encounter: Payer: Self-pay | Admitting: Internal Medicine

## 2013-06-11 ENCOUNTER — Ambulatory Visit: Payer: PRIVATE HEALTH INSURANCE | Admitting: Gastroenterology

## 2013-06-11 ENCOUNTER — Telehealth: Payer: Self-pay | Admitting: Gastroenterology

## 2013-06-11 NOTE — Telephone Encounter (Signed)
Pt was a no show

## 2014-07-24 ENCOUNTER — Emergency Department (HOSPITAL_COMMUNITY)
Admission: EM | Admit: 2014-07-24 | Discharge: 2014-07-24 | Disposition: A | Discharge status: Home / Self Care | Payer: PRIVATE HEALTH INSURANCE | Attending: Emergency Medicine | Admitting: Emergency Medicine

## 2014-07-24 ENCOUNTER — Encounter (HOSPITAL_COMMUNITY): Payer: Self-pay | Admitting: Emergency Medicine

## 2014-07-24 DIAGNOSIS — Z79899 Other long term (current) drug therapy: Secondary | ICD-10-CM | POA: Insufficient documentation

## 2014-07-24 DIAGNOSIS — L237 Allergic contact dermatitis due to plants, except food: Secondary | ICD-10-CM | POA: Insufficient documentation

## 2014-07-24 DIAGNOSIS — Z720 Tobacco use: Secondary | ICD-10-CM | POA: Insufficient documentation

## 2014-07-24 DIAGNOSIS — I10 Essential (primary) hypertension: Secondary | ICD-10-CM | POA: Insufficient documentation

## 2014-07-24 DIAGNOSIS — L259 Unspecified contact dermatitis, unspecified cause: Secondary | ICD-10-CM

## 2014-07-24 MED ORDER — HYDROXYZINE HCL 25 MG PO TABS
25.0000 mg | ORAL_TABLET | Freq: Once | ORAL | Status: AC
  Administered 2014-07-24: 25 mg via ORAL
  Filled 2014-07-24: qty 1

## 2014-07-24 MED ORDER — HYDROXYZINE HCL 25 MG PO TABS: 25.0000 mg | ORAL_TABLET | Freq: Four times a day (QID) | ORAL | Status: DC

## 2014-07-24 MED ORDER — DEXAMETHASONE SODIUM PHOSPHATE 10 MG/ML IJ SOLN
10.0000 mg | Freq: Once | INTRAMUSCULAR | Status: AC
  Administered 2014-07-24: 10 mg via INTRAMUSCULAR
  Filled 2014-07-24: qty 1

## 2014-07-24 MED ORDER — FAMOTIDINE 20 MG PO TABS
20.0000 mg | ORAL_TABLET | Freq: Once | ORAL | Status: AC
  Administered 2014-07-24: 20 mg via ORAL
  Filled 2014-07-24: qty 1

## 2014-07-24 MED ORDER — DEXAMETHASONE 4 MG PO TABS: ORAL_TABLET | ORAL | Status: DC

## 2014-07-24 NOTE — Discharge Instructions (Signed)
He treated in the emergency department with a steroid injection, as well as Vistaril for itching. Please use the Vistaril every 6 hours if needed for itching. Vistaril may cause drowsiness, please use with caution. Please use the Decadron 2 times daily with food until all taken. Please return to the emergency department immediately if any increase in facial swelling, difficulty with swallowing, or difficulty with breathing. Contact Dermatitis Contact dermatitis is a reaction to certain substances that touch the skin. Contact dermatitis can be either irritant contact dermatitis or allergic contact dermatitis. Irritant contact dermatitis does not require previous exposure to the substance for a reaction to occur.Allergic contact dermatitis only occurs if you have been exposed to the substance before. Upon a repeat exposure, your body reacts to the substance.  CAUSES  Many substances can cause contact dermatitis. Irritant dermatitis is most commonly caused by repeated exposure to mildly irritating substances, such as:  Makeup.  Soaps.  Detergents.  Bleaches.  Acids.  Metal salts, such as nickel. Allergic contact dermatitis is most commonly caused by exposure to:  Poisonous plants.  Chemicals (deodorants, shampoos).  Jewelry.  Latex.  Neomycin in triple antibiotic cream.  Preservatives in products, including clothing. SYMPTOMS  The area of skin that is exposed may develop:  Dryness or flaking.  Redness.  Cracks.  Itching.  Pain or a burning sensation.  Blisters. With allergic contact dermatitis, there may also be swelling in areas such as the eyelids, mouth, or genitals.  DIAGNOSIS  Your caregiver can usually tell what the problem is by doing a physical exam. In cases where the cause is uncertain and an allergic contact dermatitis is suspected, a patch skin test may be performed to help determine the cause of your dermatitis. TREATMENT Treatment includes protecting the  skin from further contact with the irritating substance by avoiding that substance if possible. Barrier creams, powders, and gloves may be helpful. Your caregiver may also recommend:  Steroid creams or ointments applied 2 times daily. For best results, soak the rash area in cool water for 20 minutes. Then apply the medicine. Cover the area with a plastic wrap. You can store the steroid cream in the refrigerator for a "chilly" effect on your rash. That may decrease itching. Oral steroid medicines may be needed in more severe cases.  Antibiotics or antibacterial ointments if a skin infection is present.  Antihistamine lotion or an antihistamine taken by mouth to ease itching.  Lubricants to keep moisture in your skin.  Burow's solution to reduce redness and soreness or to dry a weeping rash. Mix one packet or tablet of solution in 2 cups cool water. Dip a clean washcloth in the mixture, wring it out a bit, and put it on the affected area. Leave the cloth in place for 30 minutes. Do this as often as possible throughout the day.  Taking several cornstarch or baking soda baths daily if the area is too large to cover with a washcloth. Harsh chemicals, such as alkalis or acids, can cause skin damage that is like a burn. You should flush your skin for 15 to 20 minutes with cold water after such an exposure. You should also seek immediate medical care after exposure. Bandages (dressings), antibiotics, and pain medicine may be needed for severely irritated skin.  HOME CARE INSTRUCTIONS  Avoid the substance that caused your reaction.  Keep the area of skin that is affected away from hot water, soap, sunlight, chemicals, acidic substances, or anything else that would irritate your skin.  Do not scratch the rash. Scratching may cause the rash to become infected.  You may take cool baths to help stop the itching.  Only take over-the-counter or prescription medicines as directed by your caregiver.  See  your caregiver for follow-up care as directed to make sure your skin is healing properly. SEEK MEDICAL CARE IF:   Your condition is not better after 3 days of treatment.  You seem to be getting worse.  You see signs of infection such as swelling, tenderness, redness, soreness, or warmth in the affected area.  You have any problems related to your medicines. Document Released: 09/15/2000 Document Revised: 12/11/2011 Document Reviewed: 02/21/2011 [facility] Patient Information 2015 [place], [place]. This information is not intended to replace advice given to you by your health care provider. Make sure you discuss any questions you have with your health care provider.

## 2014-07-24 NOTE — ED Notes (Signed)
Pt states he was exposed to poison oak last Saturday and has a rash to face and arms, also spreading to legs per pt.

## 2014-07-24 NOTE — ED Notes (Signed)
Itching to face, arms, neck  Exposed to poison ivy last week when cutting up a tree .

## 2014-07-24 NOTE — ED Provider Notes (Signed)
CSN: [id number]     Arrival date & time 07/24/14  1754 History   None    Chief Complaint  Patient presents with  . Poison Oak     (Consider location/radiation/quality/duration/timing/severity/associated sxs/prior Treatment) Patient is a 63 y.o. male presenting with rash. The history is provided by the patient.  Rash Location:  Face, shoulder/arm and leg Facial rash location:  Face Shoulder/arm rash location:  L arm and R arm Leg rash location:  L lower leg and R lower leg Quality: itchiness and redness   Quality: not draining, not painful and not weeping   Severity:  Moderate Onset quality:  Gradual Duration:  5 days Timing:  Constant Progression:  Worsening Chronicity:  New Context: plant contact   Relieved by:  Nothing Ineffective treatments:  Antihistamines Associated symptoms: no hoarse voice, no shortness of breath, no sore throat, no throat swelling, no tongue swelling and not wheezing     Past Medical History  Diagnosis Date  . Hypertension    Past Surgical History  Procedure Laterality Date  . Laparoscopic gastrotomy w/ repair of ulcer    . Colonoscopy   01/27/2005    [id number] hemorrhoids, otherwise normal rectum/Normal colon, marginal prep made the exam more difficult  . Esophagogastroduodenoscopy   10/12/2004    [id number] esophagus/Focal area of submucosal petechial hemorrhage in the fundus (likely a trauma-induced phenomenon secondary to vomiting), otherwise normal   History reviewed. No pertinent family history. History  Substance Use Topics  . Smoking status: Current Every Day Smoker -- 1.00 packs/day  . Smokeless tobacco: Not on file  . Alcohol Use: No    Review of Systems  HENT: Negative for hoarse voice and sore throat.   Respiratory: Negative for shortness of breath and wheezing.   Skin: Positive for rash.      Allergies  Review of patient's allergies indicates no known allergies.  Home Medications   Prior to Admission medications    Medication Sig Start Date End Date Taking? Authorizing Provider  metoCLOPramide (REGLAN) 10 MG tablet Take 1 tablet (10 mg total) by mouth every 6 (six) hours as needed (nausea/headache). 05/25/13   Babette Relic, MD  omeprazole (PRILOSEC OTC) 20 MG tablet Take 20 mg by mouth daily.    Historical Provider, MD  ondansetron (ZOFRAN ODT) 8 MG disintegrating tablet 8mg  ODT q4 hours prn nausea 05/25/13   Babette Relic, MD  oxyCODONE-acetaminophen (PERCOCET) 5-325 MG per tablet Take 2 tablets by mouth every 6 (six) hours as needed for pain. Dispense to go. 05/25/13   Babette Relic, MD  oxyCODONE-acetaminophen (PERCOCET) 5-325 MG per tablet Take 2 tablets by mouth every 6 (six) hours as needed for pain. 05/25/13   Babette Relic, MD   BP 180/96  Pulse 81  Temp(Src) 98.8 F (37.1 C) (Oral)  Resp 18  Ht 5\' 9"  (1.753 m)  Wt 195 lb (88.451 kg)  BMI 28.78 kg/m2  SpO2 98% Physical Exam  Nursing note and vitals reviewed. Constitutional: He is oriented to person, place, and time. He appears well-developed and well-nourished.  Non-toxic appearance.  HENT:  Head: Normocephalic.  Right Ear: Tympanic membrane and external ear normal.  Left Ear: Tympanic membrane and external ear normal.  Red macular rash on the forehead and cheek of the face. Non in the eyes.  Eyes: EOM and lids are normal. Pupils are equal, round, and reactive to light.  Conjunctiva and anterior chamber clear.  Neck: Normal range of motion. Neck supple. Carotid bruit is  not present.  Cardiovascular: Normal rate, regular rhythm, normal heart sounds, intact distal pulses and normal pulses.   Pulmonary/Chest: Breath sounds normal. No respiratory distress.  Abdominal: Soft. Bowel sounds are normal. There is no tenderness. There is no guarding.  Musculoskeletal: Normal range of motion.  Lymphadenopathy:       Head (right side): No submandibular adenopathy present.       Head (left side): No submandibular adenopathy present.    He has no  cervical adenopathy.  Neurological: He is alert and oriented to person, place, and time. He has normal strength. No cranial nerve deficit or sensory deficit.  Skin: Skin is warm and dry.  Red macular rash on the forearms and the lower legs. No red streaking noted.  Psychiatric: He has a normal mood and affect. His speech is normal.    ED Course  Procedures (including critical care time) Labs Review Labs Reviewed - No data to display  Imaging Review No results found.   EKG Interpretation None      MDM  Blood pressure is 180/96, otherwise vital signs are within normal limits. Patient treated in the emergency department with intramuscular Decadron and oral Vistaril and oral Pepcid. Patient states there is some improvement in the itching.  Explained to the  Patient that it  will take a few days for the facial swelling to improve. Given instructions to return to the emergency department immediately if the facial swelling is increasing, or there is difficulty with swallowing or breathing. Prescription for Decadron 4 mg and Vistaril 25 mg given to the patient. Patient acknowledges understanding of these discharge instructions, questions answered.    Final diagnoses:  Contact dermatitis    *I have reviewed nursing notes, vital signs, and all appropriate lab and imaging results for this patient.Lenox Ahr, PA-C 07/30/14 1019

## 2014-07-30 NOTE — ED Provider Notes (Signed)
Medical screening examination/treatment/procedure(s) were performed by non-physician practitioner and as supervising physician I was immediately available for consultation/collaboration.   EKG Interpretation None       Nat Christen, MD 07/30/14 1318

## 2014-08-10 ENCOUNTER — Encounter (HOSPITAL_COMMUNITY): Payer: Self-pay | Admitting: *Deleted

## 2014-08-10 ENCOUNTER — Inpatient Hospital Stay (HOSPITAL_COMMUNITY)
Admission: EM | Admit: 2014-08-10 | Discharge: 2014-08-12 | DRG: 637 | Disposition: A | Discharge status: Home / Self Care | Payer: PRIVATE HEALTH INSURANCE | Attending: Internal Medicine | Admitting: Internal Medicine

## 2014-08-10 DIAGNOSIS — E870 Hyperosmolality and hypernatremia: Secondary | ICD-10-CM | POA: Diagnosis present

## 2014-08-10 DIAGNOSIS — E1100 Type 2 diabetes mellitus with hyperosmolarity without nonketotic hyperglycemic-hyperosmolar coma (NKHHC): Principal | ICD-10-CM | POA: Diagnosis present

## 2014-08-10 DIAGNOSIS — E1165 Type 2 diabetes mellitus with hyperglycemia: Secondary | ICD-10-CM | POA: Diagnosis present

## 2014-08-10 DIAGNOSIS — E119 Type 2 diabetes mellitus without complications: Secondary | ICD-10-CM

## 2014-08-10 DIAGNOSIS — I10 Essential (primary) hypertension: Secondary | ICD-10-CM | POA: Diagnosis present

## 2014-08-10 DIAGNOSIS — Z79891 Long term (current) use of opiate analgesic: Secondary | ICD-10-CM

## 2014-08-10 DIAGNOSIS — E118 Type 2 diabetes mellitus with unspecified complications: Secondary | ICD-10-CM

## 2014-08-10 DIAGNOSIS — K852 Alcohol induced acute pancreatitis without necrosis or infection: Secondary | ICD-10-CM

## 2014-08-10 DIAGNOSIS — F1020 Alcohol dependence, uncomplicated: Secondary | ICD-10-CM | POA: Diagnosis present

## 2014-08-10 DIAGNOSIS — F17210 Nicotine dependence, cigarettes, uncomplicated: Secondary | ICD-10-CM | POA: Diagnosis present

## 2014-08-10 HISTORY — DX: Acute pancreatitis without necrosis or infection, unspecified: K85.90

## 2014-08-10 HISTORY — DX: Type 2 diabetes mellitus with unspecified complications: E11.8

## 2014-08-10 HISTORY — DX: Alcohol induced acute pancreatitis without necrosis or infection: K85.20

## 2014-08-10 LAB — COMPREHENSIVE METABOLIC PANEL
ALBUMIN: 3.1 g/dL — AB (ref 3.5–5.2)
ALT: 55 U/L — AB (ref 0–53)
AST: 28 U/L (ref 0–37)
Alkaline Phosphatase: 133 U/L — ABNORMAL HIGH (ref 39–117)
Anion gap: 12 (ref 5–15)
BUN: 36 mg/dL — ABNORMAL HIGH (ref 6–23)
CALCIUM: 9.2 mg/dL (ref 8.4–10.5)
CHLORIDE: 88 meq/L — AB (ref 96–112)
CO2: 25 meq/L (ref 19–32)
Creatinine, Ser: 0.97 mg/dL (ref 0.50–1.35)
GFR calc Af Amer: >90 mL/min (ref >90)
GFR, EST NON AFRICAN AMERICAN: 85 mL/min — AB (ref >90)
Glucose, Bld: 713 mg/dL (ref 70–99)
Potassium: 4.5 mEq/L (ref 3.7–5.3)
SODIUM: 125 meq/L — AB (ref 137–147)
Total Bilirubin: 0.4 mg/dL (ref 0.3–1.2)
Total Protein: 6.6 g/dL (ref 6.0–8.3)

## 2014-08-10 LAB — CBC WITH DIFFERENTIAL/PLATELET
BASOS ABS: 0 10*3/uL (ref 0.0–0.1)
BASOS PCT: 0 % (ref 0–1)
Eosinophils Absolute: 0.1 10*3/uL (ref 0.0–0.7)
Eosinophils Relative: 1 % (ref 0–5)
HCT: 44.6 % (ref 39.0–52.0)
Hemoglobin: 16.6 g/dL (ref 13.0–17.0)
LYMPHS PCT: 35 % (ref 12–46)
Lymphs Abs: 3.6 10*3/uL (ref 0.7–4.0)
MCH: 33.7 pg (ref 26.0–34.0)
MCHC: 37.2 g/dL — ABNORMAL HIGH (ref 30.0–36.0)
MCV: 90.5 fL (ref 78.0–100.0)
Monocytes Absolute: 0.5 10*3/uL (ref 0.1–1.0)
Monocytes Relative: 5 % (ref 3–12)
NEUTROS ABS: 6 10*3/uL (ref 1.7–7.7)
Neutrophils Relative %: 59 % (ref 43–77)
PLATELETS: 218 10*3/uL (ref 150–400)
RBC: 4.93 MIL/uL (ref 4.22–5.81)
RDW: 11.7 % (ref 11.5–15.5)
WBC: 10.2 10*3/uL (ref 4.0–10.5)

## 2014-08-10 LAB — CBC
HEMATOCRIT: 45 % (ref 39.0–52.0)
HEMOGLOBIN: 16.9 g/dL (ref 13.0–17.0)
MCH: 34.1 pg — ABNORMAL HIGH (ref 26.0–34.0)
MCHC: 37.6 g/dL — ABNORMAL HIGH (ref 30.0–36.0)
MCV: 90.7 fL (ref 78.0–100.0)
Platelets: 221 10*3/uL (ref 150–400)
RBC: 4.96 MIL/uL (ref 4.22–5.81)
RDW: 11.7 % (ref 11.5–15.5)
WBC: 10.2 10*3/uL (ref 4.0–10.5)

## 2014-08-10 LAB — BASIC METABOLIC PANEL
ANION GAP: 13 (ref 5–15)
BUN: 32 mg/dL — ABNORMAL HIGH (ref 6–23)
CO2: 24 mEq/L (ref 19–32)
Calcium: 9.1 mg/dL (ref 8.4–10.5)
Chloride: 91 mEq/L — ABNORMAL LOW (ref 96–112)
Creatinine, Ser: 0.87 mg/dL (ref 0.50–1.35)
GFR calc Af Amer: >90 mL/min (ref >90)
GFR calc non Af Amer: 89 mL/min — ABNORMAL LOW (ref >90)
GLUCOSE: 552 mg/dL — AB (ref 70–99)
Potassium: 4.5 mEq/L (ref 3.7–5.3)
Sodium: 128 mEq/L — ABNORMAL LOW (ref 137–147)

## 2014-08-10 LAB — LIPASE, BLOOD: Lipase: 262 U/L — ABNORMAL HIGH (ref 11–59)

## 2014-08-10 LAB — CBG MONITORING, ED: Glucose-Capillary: 541 mg/dL — ABNORMAL HIGH (ref 70–99)

## 2014-08-10 LAB — GLUCOSE, CAPILLARY: GLUCOSE-CAPILLARY: 477 mg/dL — AB (ref 70–99)

## 2014-08-10 MED ORDER — HEPARIN SODIUM (PORCINE) 5000 UNIT/ML IJ SOLN
5000.0000 [IU] | Freq: Three times a day (TID) | INTRAMUSCULAR | Status: DC
  Administered 2014-08-11 – 2014-08-12 (×6): 5000 [IU] via SUBCUTANEOUS
  Filled 2014-08-10 (×6): qty 1

## 2014-08-10 MED ORDER — SODIUM CHLORIDE 0.9 % IV SOLN
INTRAVENOUS | Status: DC
  Filled 2014-08-10: qty 2.5

## 2014-08-10 MED ORDER — DEXTROSE 50 % IV SOLN: 25.0000 mL | INTRAVENOUS | Status: DC | PRN

## 2014-08-10 MED ORDER — ONDANSETRON HCL 4 MG/2ML IJ SOLN
4.0000 mg | Freq: Four times a day (QID) | INTRAMUSCULAR | Status: DC | PRN
Start: 2014-08-10 — End: 2014-08-12

## 2014-08-10 MED ORDER — ONDANSETRON HCL 4 MG PO TABS: 4.0000 mg | ORAL_TABLET | Freq: Four times a day (QID) | ORAL | Status: DC | PRN

## 2014-08-10 MED ORDER — ONDANSETRON HCL 4 MG/2ML IJ SOLN
4.0000 mg | Freq: Once | INTRAMUSCULAR | Status: AC
  Administered 2014-08-10: 4 mg via INTRAVENOUS
  Filled 2014-08-10: qty 2

## 2014-08-10 MED ORDER — SODIUM CHLORIDE 0.9 % IV SOLN
INTRAVENOUS | Status: DC
  Administered 2014-08-10 – 2014-08-12 (×4): via INTRAVENOUS

## 2014-08-10 MED ORDER — SODIUM CHLORIDE 0.9 % IJ SOLN
3.0000 mL | Freq: Two times a day (BID) | INTRAMUSCULAR | Status: DC
  Administered 2014-08-11 – 2014-08-12 (×2): 3 mL via INTRAVENOUS

## 2014-08-10 MED ORDER — LORAZEPAM 2 MG/ML IJ SOLN: 1.0000 mg | Freq: Four times a day (QID) | INTRAMUSCULAR | Status: DC | PRN

## 2014-08-10 MED ORDER — DEXTROSE-NACL 5-0.45 % IV SOLN: INTRAVENOUS | Status: DC

## 2014-08-10 MED ORDER — SODIUM CHLORIDE 0.9 % IV SOLN
INTRAVENOUS | Status: DC
  Administered 2014-08-10: 4.8 [IU]/h via INTRAVENOUS
  Filled 2014-08-10: qty 2.5

## 2014-08-10 MED ORDER — POTASSIUM CHLORIDE 10 MEQ/100ML IV SOLN
10.0000 meq | INTRAVENOUS | Status: AC
  Administered 2014-08-11 (×4): 10 meq via INTRAVENOUS
  Filled 2014-08-10 (×2): qty 100

## 2014-08-10 MED ORDER — SODIUM CHLORIDE 0.9 % IV SOLN: INTRAVENOUS | Status: DC

## 2014-08-10 MED ORDER — SODIUM CHLORIDE 0.9 % IV BOLUS (SEPSIS)
1000.0000 mL | Freq: Once | INTRAVENOUS | Status: AC
  Administered 2014-08-10: 1000 mL via INTRAVENOUS

## 2014-08-10 MED ORDER — HEPARIN SODIUM (PORCINE) 5000 UNIT/ML IJ SOLN
5000.0000 [IU] | Freq: Three times a day (TID) | INTRAMUSCULAR | Status: DC
Start: 2014-08-10 — End: 2014-08-10

## 2014-08-10 MED ORDER — HYDROMORPHONE HCL 1 MG/ML IJ SOLN
0.5000 mg | Freq: Once | INTRAMUSCULAR | Status: AC
  Administered 2014-08-10: 0.5 mg via INTRAVENOUS
  Filled 2014-08-10: qty 1

## 2014-08-10 MED ORDER — SODIUM CHLORIDE 0.9 % IV SOLN: INTRAVENOUS | Status: AC

## 2014-08-10 NOTE — H&P (Signed)
[facility] History and Physical  Dylan Cline [id number] DOB: [birth year] DOA: 08/10/2014  Referring physician: ER PCP: Robert Bellow, MD   Chief Complaint: dizziness, weakness.  HPI: Dylan Cline is a 63 y.o. male  This is a 63 year old man, alcoholic, history of pancreatitis, who presents with weakness, polyuria, polydipsia for the last 2 weeks. He has also had abdominal pain consistent with his pancreatitis. He has had severe nausea but not significant vomiting. Evaluation in the emergency room shows him to be a newly diagnosed diabetic with also evidence of pancreatitis. He is now being admitted for further management. He tells me that he drinks 2 bottles of wine every night and has been doing so for several weeks.   Review of Systems:  Constitutional:  No weight loss, night sweats, Fevers, chills, fatigue.  HEENT:  No headaches, Difficulty swallowing,Tooth/dental problems,Sore throat,  No sneezing, itching, ear ache, nasal congestion, post nasal drip,  Cardio-vascular:  No chest pain, Orthopnea, PND, swelling in lower extremities, anasarca, dizziness, palpitations   Resp:  No shortness of breath with exertion or at rest. No excess mucus, no productive cough, No non-productive cough, No coughing up of blood.No change in color of mucus.No wheezing.No chest wall deformity  Skin:  no rash or lesions.  GU:  no dysuria, change in color of urine, no urgency or frequency. No flank pain.  Musculoskeletal:  No joint pain or swelling. No decreased range of motion. No back pain.  Psych:  No change in mood or affect. No depression or anxiety. No memory loss.   Past Medical History  Diagnosis Date  . Hypertension   . Pancreatitis    Past Surgical History  Procedure Laterality Date  . Laparoscopic gastrotomy w/ repair of ulcer    . Colonoscopy   01/27/2005    [id number] hemorrhoids, otherwise normal rectum/Normal colon, marginal prep made the exam more difficult    . Esophagogastroduodenoscopy   10/12/2004    [id number] esophagus/Focal area of submucosal petechial hemorrhage in the fundus (likely a trauma-induced phenomenon secondary to vomiting), otherwise normal   Social History:  reports that he has been smoking.  He does not have any smokeless tobacco history on file. He reports that he does not drink alcohol or use illicit drugs.  No Known Allergies  No family history on file.   Prior to Admission medications   Medication Sig Start Date End Date Taking? Authorizing Provider  hydrOXYzine (ATARAX/VISTARIL) 25 MG tablet Take 1 tablet (25 mg total) by mouth every 6 (six) hours. 07/24/14  Yes Lenox Ahr, PA-C  dexamethasone (DECADRON) 4 MG tablet 1 po bid with food 07/24/14   Lenox Ahr, PA-C  metoCLOPramide (REGLAN) 10 MG tablet Take 1 tablet (10 mg total) by mouth every 6 (six) hours as needed (nausea/headache). Patient not taking: Reported on 08/10/2014 05/25/13   Babette Relic, MD  omeprazole (PRILOSEC OTC) 20 MG tablet Take 20 mg by mouth daily.    Historical Provider, MD  ondansetron (ZOFRAN ODT) 8 MG disintegrating tablet 8mg  ODT q4 hours prn nausea Patient not taking: Reported on 08/10/2014 05/25/13   Babette Relic, MD  oxyCODONE-acetaminophen (PERCOCET) 5-325 MG per tablet Take 2 tablets by mouth every 6 (six) hours as needed for pain. Dispense to go. Patient not taking: Reported on 08/10/2014 05/25/13   Babette Relic, MD  oxyCODONE-acetaminophen (PERCOCET) 5-325 MG per tablet Take 2 tablets by mouth every 6 (six) hours as needed for pain. Patient not taking: Reported on 08/10/2014  05/25/13   Babette Relic, MD   Physical Exam: Filed Vitals:   08/10/14 2012 08/10/14 2100 08/10/14 2130 08/10/14 2200  BP: 174/110 141/97 147/95 152/98  Pulse: 92 45 87 86  Temp:      TempSrc:      Resp: 18     Height:      SpO2: 97% 100% 94% 97%    Wt Readings from Last 3 Encounters:  07/24/14 88.451 kg (195 lb)  05/24/13 88.905 kg (196 lb)   10/28/11 85.73 kg (189 lb)    General:  Appears clinically dehydrated. He is slightly drowsy. Eyes: PERRL, normal lids, irises & conjunctiva ENT: grossly normal hearing, lips & tongue Neck: no LAD, masses or thyromegaly Cardiovascular: RRR, no m/r/g. No LE edema. Telemetry: SR, no arrhythmias  Respiratory: CTA bilaterally, no w/r/r. Normal respiratory effort. Abdomen: soft, ntnd Skin: no rash or induration seen on limited exam Musculoskeletal: grossly normal tone BUE/BLE  Neurologic: grossly non-focal.          Labs on Admission:  Basic Metabolic Panel:  Recent Labs Lab 08/10/14 2050  NA 125*  K 4.5  CL 88*  CO2 25  GLUCOSE 713*  BUN 36*  CREATININE 0.97  CALCIUM 9.2   Liver Function Tests:  Recent Labs Lab 08/10/14 2050  AST 28  ALT 55*  ALKPHOS 133*  BILITOT 0.4  PROT 6.6  ALBUMIN 3.1*    Recent Labs Lab 08/10/14 2050  LIPASE 262*   No results for input(s): AMMONIA in the last 168 hours. CBC:  Recent Labs Lab 08/10/14 2050  WBC 10.2  NEUTROABS 6.0  HGB 16.6  HCT 44.6  MCV 90.5  PLT 218   Cardiac Enzymes: No results for input(s): CKTOTAL, CKMB, CKMBINDEX, TROPONINI in the last 168 hours.  BNP (last 3 results) No results for input(s): PROBNP in the last 8760 hours. CBG: No results for input(s): GLUCAP in the last 168 hours.  Radiological Exams on Admission: No results found.    Assessment/Plan   1. New diagnosed uncontrolled diabetes with probable hyperosmolar state. 2. Acute pancreatitis, alcoholic. 3. Alcoholism.  Plan: 1. Admit to stepdown unit. 2. Intravenous fluids.nothing by mouth. 3. Intravenous insulin with glucose monitor control. 4. Watch renal function and look as closely for adjustments and IV fluids and insulin. 5. Watch for alcohol withdrawal.  Further recommendations will depend on patient's hospital progress.   Code Status: full code.   DVT Prophylaxis:heparin.  Family Communication: I discussed the  plan with the patient at the bedside.   Disposition Plan: home when medically stable.   Time spent: 60 minutes.  Doree Albee [facility] Pager [phone].

## 2014-08-10 NOTE — ED Provider Notes (Signed)
CSN: [id number]     Arrival date & time 08/10/14  1737 History  This chart was scribe for Maudry Diego, MD by Judithann Sauger, ED Scribe. The patient was seen in room APA10/APA10 and the patient's care was started at 7:43 PM.    Chief Complaint  Patient presents with  . Dizziness   Patient is a 63 y.o. male presenting with dizziness. The history is provided by the patient (Pt complains of dizziness). No language interpreter was used.  Dizziness Duration:  2 weeks Timing:  Constant Relieved by:  None tried Worsened by:  Nothing tried Ineffective treatments:  None tried Associated symptoms: no chest pain, no diarrhea and no headaches    HPI Comments: Dylan Cline is a 63 y.o. male with a history of pancreatitis who presents to the Emergency Department complaining of weakness onset 2 weeks ago. He reports associated abdominal pain and mouth dryness. He denies vomiting. He states that he drinks milk when he has abdominal pain. He reports a poison oak rash which he was treated for on 07/24/14.   PCP: Dr. Karie Kirks.     Past Medical History  Diagnosis Date  . Hypertension   . Pancreatitis    Past Surgical History  Procedure Laterality Date  . Laparoscopic gastrotomy w/ repair of ulcer    . Colonoscopy   01/27/2005    [id number] hemorrhoids, otherwise normal rectum/Normal colon, marginal prep made the exam more difficult  . Esophagogastroduodenoscopy   10/12/2004    [id number] esophagus/Focal area of submucosal petechial hemorrhage in the fundus (likely a trauma-induced phenomenon secondary to vomiting), otherwise normal   No family history on file. History  Substance Use Topics  . Smoking status: Current Every Day Smoker -- 1.00 packs/day  . Smokeless tobacco: Not on file  . Alcohol Use: No    Review of Systems  Constitutional: Negative for appetite change and fatigue.  HENT: Negative for congestion, ear discharge and sinus pressure.   Eyes: Negative for discharge.   Respiratory: Negative for cough.   Cardiovascular: Negative for chest pain.  Gastrointestinal: Negative for abdominal pain and diarrhea.  Genitourinary: Negative for frequency and hematuria.  Musculoskeletal: Negative for back pain.  Skin: Negative for rash.  Neurological: Positive for dizziness. Negative for seizures and headaches.  Psychiatric/Behavioral: Negative for hallucinations.      Allergies  Review of patient's allergies indicates no known allergies.  Home Medications   Prior to Admission medications   Medication Sig Start Date End Date Taking? Authorizing Provider  dexamethasone (DECADRON) 4 MG tablet 1 po bid with food 07/24/14   Lenox Ahr, PA-C  hydrOXYzine (ATARAX/VISTARIL) 25 MG tablet Take 1 tablet (25 mg total) by mouth every 6 (six) hours. 07/24/14   Lenox Ahr, PA-C  metoCLOPramide (REGLAN) 10 MG tablet Take 1 tablet (10 mg total) by mouth every 6 (six) hours as needed (nausea/headache). 05/25/13   Babette Relic, MD  omeprazole (PRILOSEC OTC) 20 MG tablet Take 20 mg by mouth daily.    Historical Provider, MD  ondansetron (ZOFRAN ODT) 8 MG disintegrating tablet 8mg  ODT q4 hours prn nausea 05/25/13   Babette Relic, MD  oxyCODONE-acetaminophen (PERCOCET) 5-325 MG per tablet Take 2 tablets by mouth every 6 (six) hours as needed for pain. Dispense to go. Patient not taking: Reported on 08/10/2014 05/25/13   Babette Relic, MD  oxyCODONE-acetaminophen (PERCOCET) 5-325 MG per tablet Take 2 tablets by mouth every 6 (six) hours as needed for pain. 05/25/13  Babette Relic, MD   BP 171/93 mmHg  Pulse 85  Temp(Src) 98.2 F (36.8 C) (Oral)  Resp 18  Ht 5\' 9"  (1.753 m)  SpO2 97% Physical Exam  Constitutional: He is oriented to person, place, and time. He appears well-developed.  HENT:  Head: Normocephalic.  Mucous membrane dry  Eyes: Conjunctivae and EOM are normal. No scleral icterus.  Neck: Neck supple. No thyromegaly present.  Cardiovascular: Normal rate and  regular rhythm.  Exam reveals no gallop and no friction rub.   No murmur heard. Pulmonary/Chest: No stridor. He has no wheezes. He has no rales. He exhibits no tenderness.  Abdominal: He exhibits no distension. There is no tenderness. There is no rebound.  Musculoskeletal: Normal range of motion. He exhibits no edema.  Lymphadenopathy:    He has no cervical adenopathy.  Neurological: He is oriented to person, place, and time. He exhibits normal muscle tone. Coordination normal.  Skin: Skin is dry. No rash noted. No erythema.  Psychiatric: He has a normal mood and affect. His behavior is normal.  Nursing note and vitals reviewed.   ED Course  Procedures (including critical care time) DIAGNOSTIC STUDIES: Oxygen Saturation is 97% on RA, adequate by my interpretation.    COORDINATION OF CARE: 7:46 PM- Pt advised of plan for treatment and pt agrees.  Labs Review Labs Reviewed - No data to display  Imaging Review No results found.   EKG Interpretation None      MDM   Final diagnoses:  None     I personally performed the services described in this documentation, which was scribed in my presence. The recorded information has been reviewed and is accurate.     Maudry Diego, MD 08/10/14 2203

## 2014-08-10 NOTE — ED Notes (Signed)
Pt says he has been dizzy for 2 weeks and that he has rash "all over" that he says is poison oak.

## 2014-08-10 NOTE — ED Notes (Signed)
CRITICAL VALUE ALERT  Critical value received:  Glucose 713  Date of notification:  08/10/14  Time of notification:  2127  Critical value read back:Yes.    Nurse who received alert:  Matilde Haymaker, RN  MD notified (1st page):  Dr. Roderic Palau  Time of first page:  2127

## 2014-08-11 DIAGNOSIS — E1101 Type 2 diabetes mellitus with hyperosmolarity with coma: Secondary | ICD-10-CM | POA: Insufficient documentation

## 2014-08-11 LAB — GLUCOSE, CAPILLARY
GLUCOSE-CAPILLARY: 280 mg/dL — AB (ref 70–99)
GLUCOSE-CAPILLARY: 155 mg/dL — AB (ref 70–99)
GLUCOSE-CAPILLARY: 143 mg/dL — AB (ref 70–99)
GLUCOSE-CAPILLARY: 240 mg/dL — AB (ref 70–99)
Glucose-Capillary: 139 mg/dL — ABNORMAL HIGH (ref 70–99)
Glucose-Capillary: 153 mg/dL — ABNORMAL HIGH (ref 70–99)
Glucose-Capillary: 155 mg/dL — ABNORMAL HIGH (ref 70–99)
Glucose-Capillary: 162 mg/dL — ABNORMAL HIGH (ref 70–99)
Glucose-Capillary: 193 mg/dL — ABNORMAL HIGH (ref 70–99)
Glucose-Capillary: 215 mg/dL — ABNORMAL HIGH (ref 70–99)
Glucose-Capillary: 233 mg/dL — ABNORMAL HIGH (ref 70–99)
Glucose-Capillary: 262 mg/dL — ABNORMAL HIGH (ref 70–99)
Glucose-Capillary: 363 mg/dL — ABNORMAL HIGH (ref 70–99)

## 2014-08-11 LAB — HEMOGLOBIN A1C
Hgb A1c MFr Bld: 16.5 % — ABNORMAL HIGH (ref <5.7)
Mean Plasma Glucose: 427 mg/dL — ABNORMAL HIGH (ref <117)

## 2014-08-11 LAB — CBC
HCT: 44.3 % (ref 39.0–52.0)
Hemoglobin: 16.5 g/dL (ref 13.0–17.0)
MCH: 34 pg (ref 26.0–34.0)
MCHC: 37.2 g/dL — ABNORMAL HIGH (ref 30.0–36.0)
MCV: 91.2 fL (ref 78.0–100.0)
PLATELETS: 231 10*3/uL (ref 150–400)
RBC: 4.86 MIL/uL (ref 4.22–5.81)
RDW: 11.8 % (ref 11.5–15.5)
WBC: 9 10*3/uL (ref 4.0–10.5)

## 2014-08-11 LAB — BASIC METABOLIC PANEL
ANION GAP: 10 (ref 5–15)
ANION GAP: 12 (ref 5–15)
ANION GAP: 12 (ref 5–15)
BUN: 29 mg/dL — ABNORMAL HIGH (ref 6–23)
BUN: 25 mg/dL — ABNORMAL HIGH (ref 6–23)
BUN: 23 mg/dL (ref 6–23)
CHLORIDE: 101 meq/L (ref 96–112)
CHLORIDE: 96 meq/L (ref 96–112)
CHLORIDE: 99 meq/L (ref 96–112)
CO2: 24 mEq/L (ref 19–32)
CO2: 25 mEq/L (ref 19–32)
CO2: 24 meq/L (ref 19–32)
CREATININE: 0.81 mg/dL (ref 0.50–1.35)
CREATININE: 0.84 mg/dL (ref 0.50–1.35)
CREATININE: 0.89 mg/dL (ref 0.50–1.35)
Calcium: 9.3 mg/dL (ref 8.4–10.5)
Calcium: 9 mg/dL (ref 8.4–10.5)
Calcium: 8.8 mg/dL (ref 8.4–10.5)
GFR calc Af Amer: >90 mL/min (ref >90)
GFR calc non Af Amer: 88 mL/min — ABNORMAL LOW (ref >90)
GFR calc non Af Amer: >90 mL/min (ref >90)
GFR calc non Af Amer: >90 mL/min (ref >90)
Glucose, Bld: 345 mg/dL — ABNORMAL HIGH (ref 70–99)
Glucose, Bld: 225 mg/dL — ABNORMAL HIGH (ref 70–99)
Glucose, Bld: 161 mg/dL — ABNORMAL HIGH (ref 70–99)
POTASSIUM: 4.1 meq/L (ref 3.7–5.3)
Potassium: 4.2 mEq/L (ref 3.7–5.3)
Potassium: 4.2 mEq/L (ref 3.7–5.3)
SODIUM: 137 meq/L (ref 137–147)
Sodium: 132 mEq/L — ABNORMAL LOW (ref 137–147)
Sodium: 134 mEq/L — ABNORMAL LOW (ref 137–147)

## 2014-08-11 LAB — COMPREHENSIVE METABOLIC PANEL
ALT: 49 U/L (ref 0–53)
AST: 22 U/L (ref 0–37)
Albumin: 3 g/dL — ABNORMAL LOW (ref 3.5–5.2)
Alkaline Phosphatase: 120 U/L — ABNORMAL HIGH (ref 39–117)
Anion gap: 10 (ref 5–15)
BILIRUBIN TOTAL: 0.4 mg/dL (ref 0.3–1.2)
BUN: 23 mg/dL (ref 6–23)
CHLORIDE: 102 meq/L (ref 96–112)
CO2: 25 meq/L (ref 19–32)
CREATININE: 0.81 mg/dL (ref 0.50–1.35)
Calcium: 9 mg/dL (ref 8.4–10.5)
GLUCOSE: 163 mg/dL — AB (ref 70–99)
Potassium: 4.2 mEq/L (ref 3.7–5.3)
Sodium: 137 mEq/L (ref 137–147)
Total Protein: 6.4 g/dL (ref 6.0–8.3)

## 2014-08-11 LAB — MRSA PCR SCREENING: MRSA by PCR: NEGATIVE

## 2014-08-11 MED ORDER — INSULIN DETEMIR 100 UNIT/ML ~~LOC~~ SOLN
10.0000 [IU] | Freq: Every day | SUBCUTANEOUS | Status: DC
  Administered 2014-08-11: 10 [IU] via SUBCUTANEOUS
  Filled 2014-08-11 (×2): qty 0.1

## 2014-08-11 MED ORDER — POLYETHYLENE GLYCOL 3350 17 G PO PACK
17.0000 g | PACK | Freq: Every day | ORAL | Status: DC
  Administered 2014-08-11 – 2014-08-12 (×2): 17 g via ORAL
  Filled 2014-08-11 (×3): qty 1

## 2014-08-11 MED ORDER — INSULIN ASPART 100 UNIT/ML ~~LOC~~ SOLN
4.0000 [IU] | Freq: Three times a day (TID) | SUBCUTANEOUS | Status: DC
  Administered 2014-08-11 – 2014-08-12 (×3): 4 [IU] via SUBCUTANEOUS

## 2014-08-11 MED ORDER — LIVING WELL WITH DIABETES BOOK
Freq: Once | Status: DC
  Filled 2014-08-11: qty 1

## 2014-08-11 MED ORDER — INSULIN STARTER KIT- SYRINGES (ENGLISH)
1.0000 | Freq: Once | Status: DC
  Filled 2014-08-11: qty 1

## 2014-08-11 MED ORDER — INSULIN ASPART 100 UNIT/ML ~~LOC~~ SOLN
0.0000 [IU] | Freq: Three times a day (TID) | SUBCUTANEOUS | Status: DC
  Administered 2014-08-11 (×2): 5 [IU] via SUBCUTANEOUS
  Administered 2014-08-12: 8 [IU] via SUBCUTANEOUS
  Administered 2014-08-12: 5 [IU] via SUBCUTANEOUS

## 2014-08-11 MED ORDER — ACETAMINOPHEN 325 MG PO TABS
650.0000 mg | ORAL_TABLET | Freq: Four times a day (QID) | ORAL | Status: DC | PRN
  Administered 2014-08-11 (×2): 650 mg via ORAL
  Filled 2014-08-11 (×4): qty 2

## 2014-08-11 MED ORDER — OXYCODONE-ACETAMINOPHEN 5-325 MG PO TABS
1.0000 | ORAL_TABLET | Freq: Four times a day (QID) | ORAL | Status: DC | PRN
  Administered 2014-08-11: 1 via ORAL
  Filled 2014-08-11: qty 1

## 2014-08-11 MED ORDER — INSULIN DETEMIR 100 UNIT/ML ~~LOC~~ SOLN
10.0000 [IU] | Freq: Once | SUBCUTANEOUS | Status: AC
  Administered 2014-08-11: 10 [IU] via SUBCUTANEOUS
  Filled 2014-08-11: qty 0.1

## 2014-08-11 NOTE — Progress Notes (Signed)
Spoke with patient and his wife about new diabetes diagnosis. Discussed diabetes and explained what an A1C is, basic pathophysiology of DM Type 2, basic home care, importance of checking CBGs and maintaining good CBG control to prevent long-term and short-term complications. Reviewed signs and symptoms of hyperglycemia and hypoglycemia along with treatment for both. Patient reports that Dr. Karie Kirks is his PCP and wife states that patient does have some type of insurance.  Discussed impact of nutrition, exercise, stress, sickness, and medications on diabetes control.   Discussed carbohydrates, carbohydrate goals per day and meal, along with portion sizes. Informed patient about free outpatient diabetes education classes here at [facility] and provided written handouts on the class. Encouraged patient to attend one of the classes to help reinforce the information we have provided here. During our conversation patient was tearful and began to cry. In talking to the patient about his emotions he stated that he was overwhelmed with all the information. Provided emotional support and patient's wife reminded him "it will be all right, we just have to learn what to do to take care of you". Patient verbalized understanding of information discussed and he states that he has no further questions at this time related to diabetes.   Talked with Janett Billow, RN, CM and she states that she does not think the patient has insurance and the financial advisor here at the hospital was not able to get any insurance information. According to [place], RN, CM patient's wife stated to one person that he has insurance but then stated to others that he does not have any insurance. She stated to me that he does have some insurance coverage. Asked that patient's wife call his insurance company to be sure he has coverage and to see what his copays would be for Levemir and Novolog in case they are prescribed at time of discharge. If patient does  not have insurance and will discharge on insulin, MD will likely need to prescribe a more affordable insulin (such as NPH, 70/30, Regular) since Levemir is over $200 per vial and Novolog is over $150 per vial. Patient can purchase Novolin N, Novolin R, or Novolin 70/30 from [employer] for $25 per vial.  RNs to provide ongoing basic DM education at bedside with this patient and engage patient to actively check blood glucose and administer insulin injections. Bedside RN will need to review insulin starter kit and teach patient how to draw up and administer insulin injections. Also, please have patient watch diabetes education videos.  An A1C has been drawn but is still in process. Not sure of discharge plan for diabetes control (insulin versus PO medications) at this time. MD, at time of discharge patient will need prescriptions for a glucometer, testing supplies, and diabetes medications.  Thanks, Barnie Alderman, RN, MSN, CCRN Diabetes Coordinator Inpatient Diabetes Program [phone] (Team Pager) [phone] (AP office) [phone] [facility] office)

## 2014-08-11 NOTE — Plan of Care (Signed)
Problem: Phase I Progression Outcomes Goal: NPO or per MD order Outcome: Completed/Met Date Met:  08/11/14 Goal: Voiding-avoid urinary catheter unless indicated Outcome: Completed/Met Date Met:  08/11/14 Goal: Hemodynamically stable Outcome: Progressing

## 2014-08-11 NOTE — Progress Notes (Signed)
PT TRANSFERRING TO ROOM 324. PT DROWSY BUT EAISLY AROUSED. SHIN IS WARM AND DRY. HR 78 IN NSR. O2 SAT 98% ON ROOM AIR.IV INFUSING W/O ANY DIFFICULTY. TRANSFER REPORT CALLED TO KATY RN ON 300.

## 2014-08-11 NOTE — Care Management Note (Signed)
    Page 1 of 1   08/11/2014     11:31:15 AM CARE MANAGEMENT NOTE 08/11/2014  Patient:  Dylan Cline, Dylan Cline   Account Number:  [id number]  Date Initiated:  08/11/2014  Documentation initiated by:  Jolene Provost  Subjective/Objective Assessment:   Pt is from home with wife. Pt is independent with ADL's. Pt is admitted with new onset Diabetes. Wife states that pt's insurance has lapsed and he is not currently insured.     Action/Plan:   Pt will see Diabetes coordinator. Referral will be made for financial assistance through hospital. Will continue to follow for CM needs and possibility of need for medication assistance.   Anticipated DC Date:  08/13/2014   Anticipated DC Plan:  HOME/SELF CARE  In-house referral  Financial Counselor      DC Planning Services  CM consult  Medication Assistance      Choice offered to / List presented to:             Status of service:  In process, will continue to follow Medicare Important Message given?   (If response is "NO", the following Medicare IM given date fields will be blank) Date Medicare IM given:   Medicare IM given by:   Date Additional Medicare IM given:   Additional Medicare IM given by:    Discharge Disposition:  HOME/SELF CARE  Per UR Regulation:    If discussed at Long Length of Stay Meetings, dates discussed:    Comments:  08/11/2014 [place], RN, MSN, [facility]

## 2014-08-11 NOTE — Progress Notes (Signed)
INITIAL NUTRITION ASSESSMENT   INTERVENTION: Glucerna Shake po TID, each supplement provides 220 kcal and 10 grams of protein   DM education provided   NUTRITION DIAGNOSIS: Inadequate oral intake related to new onset diabetes as evidenced by HgA1C 16.5% and 8% wt loss in 30 days   Goal: Pt to meet >/= 90% of their estimated nutrition needs    Monitor:  Po intake, diet compliance, labs (glucose) and wt trends   Reason for Assessment: Malnutrition Screen   63 y.o. male  Admitting Dx: Hyperosmolar (nonketotic) coma  ASSESSMENT: pt has hx of alcoholism, pancreatitis related to ETOH. RD consulted for nutrition education regarding new onset diabetes. Significant weight loss noted likely related to new DM diagnosis.   Height: Ht Readings from Last 1 Encounters:  08/11/14 _0  (1.753 m)    Weight: Wt Readings from Last 1 Encounters:  08/11/14 177 lb 11.1 oz (80.6 kg)    Ideal Body Weight: 160# (72.7kg)  % Ideal Body Weight: 111%   Wt Readings from Last 10 Encounters:  08/11/14 177 lb 11.1 oz (80.6 kg)  07/24/14 195 lb (88.451 kg)  05/24/13 196 lb (88.905 kg)  10/28/11 189 lb (85.73 kg)    Usual Body Weight: 195#  % Usual Body Weight: 92%  BMI:  Body mass index is 26.23 kg/(m^2).  Overweight  Estimated Nutritional Needs: Kcal: 1800-2200 Protein: 97-110 gr Fluid: 1.8-2.2 liters daily  Skin: intact  Diet Order: Diet Carb Modified  EDUCATION NEEDS: -Education needs addressed   Intake/Output Summary (Last 24 hours) at 08/11/14 1643 Last data filed at 08/11/14 1400  Gross per 24 hour  Intake 2678.39 ml  Output   1450 ml  Net 1228.39 ml    Last BM: 11/8   Labs:   Recent Labs Lab 08/11/14 0041 08/11/14 0239 08/11/14 0456  NA 132* 134* 137  137  K 4.1 4.2 4.2  4.2  CL 96 99 102  101  CO2 _1 BUN 29* 25* 23  23  CREATININE 0.89 0.84 0.81  0.81  CALCIUM 9.3 9.0 9.0  8.8  GLUCOSE 345* 225* 163*  161*    CBG (last 3)    Recent Labs  08/11/14 0849 08/11/14 1013 08/11/14 1146  GLUCAP 139* 162* 240*    Scheduled Meds: . heparin  5,000 Units Subcutaneous 3 times per day  . insulin aspart  0-15 Units Subcutaneous TID WC  . insulin aspart  4 Units Subcutaneous TID WC  . insulin detemir  10 Units Subcutaneous QHS  . insulin starter kit- syringes  1 kit Other Once  . living well with diabetes book   Does not apply Once  . sodium chloride  3 mL Intravenous Q12H    Continuous Infusions: . sodium chloride 100 mL/hr at 08/11/14 1400    Past Medical History  Diagnosis Date  . Hypertension   . Pancreatitis     Past Surgical History  Procedure Laterality Date  . Laparoscopic gastrotomy w/ repair of ulcer    . Colonoscopy   01/27/2005    EVO:[id number] hemorrhoids, otherwise normal rectum/Normal colon, marginal prep made the exam more difficult  . Esophagogastroduodenoscopy   10/12/2004    WEX:HBZJIR esophagus/Focal area of submucosal petechial hemorrhage in the fundus (likely a trauma-induced phenomenon secondary to vomiting), otherwise normal    Colman Cater MS,RD,CSG,LDN Office: [phone] Pager: [phone]

## 2014-08-11 NOTE — Progress Notes (Signed)
[facility] PROGRESS NOTE  Dylan Cline [id number] DOB: [birth year] DOA: 08/10/2014 PCP: Robert Bellow, MD  Assessment/Plan: Hyperosmolar nonketotic coma -In a newly diagnosed diabetic. -Sugars are within range, will give basal insulin and start transitioning off insulin drip. -will require diabetic education. Burnis Medin request dietitian evaluation for diet education.  Acute pancreatitis -Abdominal pain has improved. -We'll start diet.   Code Status: full code Family Communication: wife and multiple other family members at bedside updated on plan of care  Disposition Plan: transfer out of the intensive care unit today.   Consultants:  none   Antibiotics:  none   Subjective: Abdominal pain improved, feels better  Objective: Filed Vitals:   08/11/14 0530 08/11/14 0600 08/11/14 0630 08/11/14 0700  BP: 130/86 133/81 136/69 125/60  Pulse: 80 77 88 81  Temp:    98.1 F (36.7 C)  TempSrc:      Resp: 19 17 22 19   Height:      Weight:      SpO2: 98% 97% 98% 97%    Intake/Output Summary (Last 24 hours) at 08/11/14 1013 Last data filed at 08/11/14 0800  Gross per 24 hour  Intake 1598.39 ml  Output    950 ml  Net 648.39 ml   Filed Weights   08/11/14 0000  Weight: 80.6 kg (177 lb 11.1 oz)    Exam:   General:  Alert, awake, oriented 3  Cardiovascular: regular rate and rhythm, no murmurs, rubs or gallops  Respiratory: clear to auscultation bilaterally  Abdomen: soft, nontender, nondistended, positive bowel sounds  Extremities: no clubbing, cyanosis or edema   Neurologic:  Intact/nonfocal  Data Reviewed: Basic Metabolic Panel:  Recent Labs Lab 08/10/14 2050 08/10/14 2255 08/11/14 0041 08/11/14 0239 08/11/14 0456  NA 125* 128* 132* 134* 137  137  K 4.5 4.5 4.1 4.2 4.2  4.2  CL 88* 91* 96 99 102  101  CO2 25 24 24 25 25  24   GLUCOSE 713* 552* 345* 225* 163*  161*  BUN 36* 32* 29* 25* 23  23  CREATININE 0.97 0.87 0.89  0.84 0.81  0.81  CALCIUM 9.2 9.1 9.3 9.0 9.0  8.8   Liver Function Tests:  Recent Labs Lab 08/10/14 2050 08/11/14 0456  AST 28 22  ALT 55* 49  ALKPHOS 133* 120*  BILITOT 0.4 0.4  PROT 6.6 6.4  ALBUMIN 3.1* 3.0*    Recent Labs Lab 08/10/14 2050  LIPASE 262*   No results for input(s): AMMONIA in the last 168 hours. CBC:  Recent Labs Lab 08/10/14 2050 08/10/14 2255 08/11/14 0456  WBC 10.2 10.2 9.0  NEUTROABS 6.0  --   --   HGB 16.6 16.9 16.5  HCT 44.6 45.0 44.3  MCV 90.5 90.7 91.2  PLT 218 221 231   Cardiac Enzymes: No results for input(s): CKTOTAL, CKMB, CKMBINDEX, TROPONINI in the last 168 hours. BNP (last 3 results) No results for input(s): PROBNP in the last 8760 hours. CBG:  Recent Labs Lab 08/11/14 0455 08/11/14 0548 08/11/14 0642 08/11/14 0744 08/11/14 0849  GLUCAP 155* 155* 143* 153* 139*    Recent Results (from the past 240 hour(s))  MRSA PCR Screening     Status: None   Collection Time: 08/10/14 11:30 PM  Result Value Ref Range Status   MRSA by PCR NEGATIVE NEGATIVE Final    Comment:        The GeneXpert MRSA Assay (FDA approved for NASAL specimens only), is one component of a comprehensive  MRSA colonization surveillance program. It is not intended to diagnose MRSA infection nor to guide or monitor treatment for MRSA infections.      Studies: No results found.  Scheduled Meds: . heparin  5,000 Units Subcutaneous 3 times per day  . insulin aspart  0-15 Units Subcutaneous TID WC  . insulin aspart  4 Units Subcutaneous TID WC  . insulin detemir  10 Units Subcutaneous QHS  . sodium chloride  3 mL Intravenous Q12H   Continuous Infusions: . sodium chloride 100 mL/hr at 08/11/14 0800  . insulin (NOVOLIN-R) infusion 1.6 Units/hr (08/11/14 0855)    Principal Problem:   Hyperosmolar (nonketotic) coma Active Problems:   Diabetes   Pancreatitis, alcoholic, acute   Alcoholism   Uncontrolled diabetes mellitus    Time spent: 45  minutes. Greater than 50% of this time was spent in direct contact with the patient coordinating care.    Lelon Frohlich  [facility] Pager [phone]  If 7PM-7AM, please contact night-coverage at [email or web address], password [facility] 08/11/2014, 10:13 AM  LOS: 1 day

## 2014-08-12 ENCOUNTER — Encounter (HOSPITAL_COMMUNITY): Payer: Self-pay | Admitting: Internal Medicine

## 2014-08-12 DIAGNOSIS — E118 Type 2 diabetes mellitus with unspecified complications: Secondary | ICD-10-CM | POA: Diagnosis present

## 2014-08-12 LAB — CBC
HCT: 41.5 % (ref 39.0–52.0)
Hemoglobin: 15.1 g/dL (ref 13.0–17.0)
MCH: 33.9 pg (ref 26.0–34.0)
MCHC: 36.4 g/dL — ABNORMAL HIGH (ref 30.0–36.0)
MCV: 93 fL (ref 78.0–100.0)
Platelets: 200 10*3/uL (ref 150–400)
RBC: 4.46 MIL/uL (ref 4.22–5.81)
RDW: 11.9 % (ref 11.5–15.5)
WBC: 8.6 10*3/uL (ref 4.0–10.5)

## 2014-08-12 LAB — BASIC METABOLIC PANEL
ANION GAP: 11 (ref 5–15)
BUN: 14 mg/dL (ref 6–23)
CO2: 23 mEq/L (ref 19–32)
Calcium: 8.1 mg/dL — ABNORMAL LOW (ref 8.4–10.5)
Chloride: 101 mEq/L (ref 96–112)
Creatinine, Ser: 0.86 mg/dL (ref 0.50–1.35)
GFR calc Af Amer: >90 mL/min (ref >90)
GFR, EST NON AFRICAN AMERICAN: 90 mL/min — AB (ref >90)
Glucose, Bld: 275 mg/dL — ABNORMAL HIGH (ref 70–99)
POTASSIUM: 4.2 meq/L (ref 3.7–5.3)
SODIUM: 135 meq/L — AB (ref 137–147)

## 2014-08-12 LAB — GLUCOSE, CAPILLARY
GLUCOSE-CAPILLARY: 261 mg/dL — AB (ref 70–99)
GLUCOSE-CAPILLARY: 210 mg/dL — AB (ref 70–99)

## 2014-08-12 MED ORDER — POLYETHYLENE GLYCOL 3350 17 G PO PACK: 17.0000 g | PACK | Freq: Every day | ORAL | Status: DC | PRN

## 2014-08-12 MED ORDER — OXYCODONE-ACETAMINOPHEN 5-325 MG PO TABS: 1.0000 | ORAL_TABLET | Freq: Four times a day (QID) | ORAL | Status: DC | PRN

## 2014-08-12 MED ORDER — INSULIN NPH ISOPHANE & REGULAR (70-30) 100 UNIT/ML ~~LOC~~ SUSP: 12.0000 [IU] | Freq: Two times a day (BID) | SUBCUTANEOUS | Status: DC

## 2014-08-12 NOTE — Plan of Care (Signed)
Problem: Phase I Progression Outcomes Goal: Hemodynamically stable Outcome: Completed/Met Date Met:  08/12/14     

## 2014-08-12 NOTE — Plan of Care (Addendum)
Problem: Phase I Progression Outcomes Goal: CBGs steadily decreasing with treatment Outcome: Completed/Met Date Met:  08/12/14    Patient states understanding of discharge.

## 2014-08-12 NOTE — Discharge Summary (Signed)
Physician Discharge Summary  Dylan Cline [id number] DOB: [birth year] DOA: 08/10/2014  PCP: unassigned; [facility].  Admit date: 08/10/2014 Discharge date: 08/12/2014  Time spent: greater than minutes 30 minutes.  Recommendations for Outpatient Follow-up:  1. Consider starting an oral antihypertensive medication if his blood pressure increases in the outpatient setting. 2. Would reevaluate the patient's blood sugars and alcohol intake or abstinence in the outpatient setting.   Discharge Diagnoses:   1. Type 2 diabetes mellitus, nonketotic, hyperosmolar. 2. Alcoholism. 3. Acute alcoholic pancreatitis. 4. Mild elevation of SGPT/mild transaminitis. 5. History of hypertension.  Discharge Condition: improved.  Diet recommendation: heart healthy.  Filed Weights   08/11/14 0000  Weight: 80.6 kg (177 lb 11.1 oz)    History of present illness:  The patient is a 63 year old man with a history of alcoholism and pancreatitis, who presented to the emergency department on 08/10/14 with a chief complaint of generalized weakness, increased thirst, increased urination, and epigastric abdominal pain.in the ED,he was afebrile and hemodynamically stable, but hypertensive. His lab data were significant for a venous glucose of 713, sodium of 124, BUN of 36,ALT of 55, and lipase of 262. His anion gap was not elevated. He had no prior history of diabetes mellitus. He was admitted for further evaluation and management.  Hospital Course:   1. Acute type 2 diabetes mellitus, nonketotic, hyperosmolar. The patient was started on an insulin drip. His capillary blood glucose and basic metabolic panel were assessed on a frequent basis. Vigorous IV fluids were given during the first 24 hours. When his blood glucose improved, therapy was transitioned to sliding scale NovoLog and Levemir.  His hemoglobin A1c was 16.5, indicating that he has had diabetes for quite a while. The  diabetes coordinator was consulted and she made recommendations. He was provided with diabetes education and instructions on self glucose monitoring and insulin injections. Because he has no insurance, he was discharged on Novolin 70/30 insulin for which he can buy for $25 a month at [employer].  Acute alcoholic pancreatitis. Reportedly, the patient drank 2 bottles of wine daily. There were no signs or symptoms consistent with alcohol withdrawal syndrome. He was treated supportively. His abdominal pain and nausea completely resolved. His diet was advanced which he tolerated well. He was strongly advised to stop drinking indefinitely.  History of hypertension. The patient was hypertensive in the ED, but his blood pressure normalized and remained on the low-normal side throughout the hospitalization. No pharmacological therapy was started. Would recommend further monitoring in the outpatient setting.  Procedures:  none  Consultations:  none  Discharge Exam: Filed Vitals:   08/12/14 0500  BP: 130/86  Pulse: 87  Temp: 98.4 F (36.9 C)  Resp: 18    General: pleasant alert 63 year old African-American man laying in bed, in no acute distress. Cardiovascular: S1, S2, with no murmurs rubs or gallops. Respiratory: clear to auscultation bilaterally. Abdomen: Positive bowel sounds, soft, nontender, nondistended. Neuropsychiatric:he is alert and oriented 3. Cranial nerves II through XII are intact. No signs of tremulousness or delirium.  Discharge Instructions You were cared for by a hospitalist during your hospital stay. If you have any questions about your discharge medications or the care you received while you were in the hospital after you are discharged, you can call the unit and asked to speak with the hospitalist on call if the hospitalist that took care of you is not available. Once you are discharged, your primary care physician will handle any  further medical issues. Please note  that NO REFILLS for any discharge medications will be authorized once you are discharged, as it is imperative that you return to your primary care physician (or establish a relationship with a primary care physician if you do not have one) for your aftercare needs so that they can reassess your need for medications and monitor your lab values.  Discharge Instructions    Diet - low sodium heart healthy    Complete by:  As directed      Diet Carb Modified    Complete by:  As directed      Discharge instructions    Complete by:  As directed   #1. Avoid drinking alcohol. #2. Check your blood sugar at home before breakfast and again in the evening. Do not take insulin if your blood sugar is below 105.     Increase activity slowly    Complete by:  As directed           Current Discharge Medication List    START taking these medications   Details  insulin NPH-regular Human (NOVOLIN 70/30) (70-30) 100 UNIT/ML injection Inject 12 Units into the skin 2 (two) times daily with a meal. Qty: 10 mL, Refills: 11    polyethylene glycol (MIRALAX / GLYCOLAX) packet Take 17 g by mouth daily as needed for moderate constipation. Qty: 14 each, Refills: 0      CONTINUE these medications which have CHANGED   Details  oxyCODONE-acetaminophen (PERCOCET) 5-325 MG per tablet Take 1-2 tablets by mouth every 6 (six) hours as needed.      CONTINUE these medications which have NOT CHANGED   Details  hydrOXYzine (ATARAX/VISTARIL) 25 MG tablet Take 1 tablet (25 mg total) by mouth every 6 (six) hours. Qty: 15 tablet, Refills: 0    omeprazole (PRILOSEC OTC) 20 MG tablet Take 20 mg by mouth daily.    ondansetron (ZOFRAN ODT) 8 MG disintegrating tablet 8mg  ODT q4 hours prn nausea Qty: 4 tablet, Refills: 0      STOP taking these medications     dexamethasone (DECADRON) 4 MG tablet      metoCLOPramide (REGLAN) 10 MG tablet        No Known Allergies Follow-up Information    Follow up with [facility]   Why:  Wed Nov 18th at [facility] information:   [address] [place] [address] [phone]        The results of significant diagnostics from this hospitalization (including imaging, microbiology, ancillary and laboratory) are listed below for reference.    Significant Diagnostic Studies: No results found.  Microbiology: Recent Results (from the past 240 hour(s))  MRSA PCR Screening     Status: None   Collection Time: 08/10/14 11:30 PM  Result Value Ref Range Status   MRSA by PCR NEGATIVE NEGATIVE Final    Comment:        The GeneXpert MRSA Assay (FDA approved for NASAL specimens only), is one component of a comprehensive MRSA colonization surveillance program. It is not intended to diagnose MRSA infection nor to guide or monitor treatment for MRSA infections.      Labs: Basic Metabolic Panel:  Recent Labs Lab 08/10/14 2255 08/11/14 0041 08/11/14 0239 08/11/14 0456 08/12/14 0610  NA 128* 132* 134* 137  137 135*  K 4.5 4.1 4.2 4.2  4.2 4.2  CL 91* 96 99 102  101 101  CO2 24 24 25  25  24 23  GLUCOSE 552* 345* 225* 163*  161* 275*  BUN 32* 29* 25* 23  23 14   CREATININE 0.87 0.89 0.84 0.81  0.81 0.86  CALCIUM 9.1 9.3 9.0 9.0  8.8 8.1*   Liver Function Tests:  Recent Labs Lab 08/10/14 2050 08/11/14 0456  AST 28 22  ALT 55* 49  ALKPHOS 133* 120*  BILITOT 0.4 0.4  PROT 6.6 6.4  ALBUMIN 3.1* 3.0*    Recent Labs Lab 08/10/14 2050  LIPASE 262*   No results for input(s): AMMONIA in the last 168 hours. CBC:  Recent Labs Lab 08/10/14 2050 08/10/14 2255 08/11/14 0456 08/12/14 0610  WBC 10.2 10.2 9.0 8.6  NEUTROABS 6.0  --   --   --   HGB 16.6 16.9 16.5 15.1  HCT 44.6 45.0 44.3 41.5  MCV 90.5 90.7 91.2 93.0  PLT 218 221 231 200   Cardiac Enzymes: No results for input(s): CKTOTAL, CKMB, CKMBINDEX, TROPONINI in the last 168 hours. BNP: BNP (last 3 results) No results for input(s): PROBNP in the last  8760 hours. CBG:  Recent Labs Lab 08/11/14 1146 08/11/14 1702 08/11/14 2016 08/12/14 0721 08/12/14 1114  GLUCAP 240* 215* 233* 261* 210*       Signed:  Sherald Balbuena  [facility] 08/12/2014, 4:01 PM

## 2014-08-12 NOTE — Plan of Care (Signed)
Problem: Consults Goal: Diabetes Mellitus Patient Education See Patient Education Module for education specifics.  Outcome: Completed/Met Date Met:  08/12/14

## 2014-08-12 NOTE — Progress Notes (Signed)
Inpatient Diabetes Program Recommendations  AACE/ADA: New Consensus Statement on Inpatient Glycemic Control (2013)  Target Ranges:  Prepandial:   less than 140 mg/dL      Peak postprandial:   less than 180 mg/dL (1-2 hours)      Critically ill patients:  140 - 180 mg/dL   Results for Dylan Cline, Dylan Cline (MRN [id number]) as of 08/12/2014 07:38  Ref. Range 08/11/2014 06:42 08/11/2014 07:44 08/11/2014 08:49 08/11/2014 10:13 08/11/2014 11:46 08/11/2014 17:02 08/11/2014 20:16 08/12/2014 07:21  Glucose-Capillary Latest Range: 70-99 mg/dL 143 (H) 153 (H) 139 (H) 162 (H) 240 (H) 215 (H) 233 (H) 261 (H)  Results for Dylan Cline, Dylan Cline (MRN [id number]) as of 08/12/2014 07:38  Ref. Range 08/10/2014 22:55  Hgb A1c MFr Bld Latest Range: <5.7 % 16.5 (H)   Diabetes history: Newly dx DM2 this admission Outpatient Diabetes medications: NA Current orders for Inpatient glycemic control: Levemir 10 units QHS, Novolog 0-15 units AC, Novolog 0-5 units HS, Novolog 4 units TID with meals  Inpatient Diabetes Program Recommendations Insulin - Basal: Patient received Levemir 10 units at 8:31 am and Levemir 10 units at 21:07 on 11/10 (for a total of Levemir 20 units on 11/10). Please consider increasing Levemir to 25 units daily to start this morning. Insulin-Meal Coverage: Please consider increasing meal coverage to Novolog 6 units TID with meals.  Note: MD, please provide information on discharge plan for diabetes management so bedside RNs can educate patient on insulin administration if patient will discharge on insulin. RNs-Please talk with patient's wife and see if she called insurance to see what coverage would be on Levemir and Novolog. If out of pocket cost is expensive, may need to change insulins to more affordable insulins (such as Novolin N, Novolin 70/30, or Novolin R). Please allow patient to practice drawing up insulin and administering insulin injections.  Thanks, Barnie Alderman, RN, MSN, CCRN, CDE Diabetes  Coordinator Inpatient Diabetes Program [phone] (Team Pager) [phone] ([facility] office) [phone] [facility] office)

## 2016-03-17 ENCOUNTER — Ambulatory Visit (INDEPENDENT_AMBULATORY_CARE_PROVIDER_SITE_OTHER): Payer: Medicare Other | Admitting: Cardiology

## 2016-03-17 ENCOUNTER — Encounter: Payer: Self-pay | Admitting: Cardiology

## 2016-03-17 VITALS — BP 184/80 | HR 74 | Ht 69.0 in | Wt 191.0 lb

## 2016-03-17 DIAGNOSIS — Z136 Encounter for screening for cardiovascular disorders: Secondary | ICD-10-CM

## 2016-03-17 DIAGNOSIS — I10 Essential (primary) hypertension: Secondary | ICD-10-CM

## 2016-03-17 DIAGNOSIS — R011 Cardiac murmur, unspecified: Secondary | ICD-10-CM

## 2016-03-17 DIAGNOSIS — R002 Palpitations: Secondary | ICD-10-CM

## 2016-03-17 MED ORDER — LISINOPRIL 20 MG PO TABS: 20.0000 mg | ORAL_TABLET | Freq: Every day | ORAL | Status: DC

## 2016-03-17 NOTE — Progress Notes (Signed)
Clinical Summary Mr. Roldan is a 65 y.o.male seen today as a new patient, he is referred by NP Wendie Simmer.                                                                                                                                                                                                                                                         1. Heart murmur - recently diagnosed by pcp - denies any significant SOB or DOE. No LE edema. No syncope, occasional dizziness with standing   2. Palpitations/PVCs - occasional palpitations. Not very often, lasts approx 10 minutes. Can occur at rest or with activity. - no other associated symptoms - occasional coffee, occasional tea, occasional sodas, no energy drinks. Drinks wine daily x 2 glasses.   3. HTN - has not taken meds yet today - does not check bp at home.  - from last pcp note mentioned increasing lisionpril to 20mg  daily, appears he is still on 10mg  daily.                                                                                           Past Medical History  Diagnosis Date  . Hypertension   . Pancreatitis   . Acute type 2 diabetes mellitus with manifestations   . Pancreatitis, alcoholic, acute [id number]     No Known Allergies   Current Outpatient Prescriptions  Medication Sig Dispense Refill  . hydrOXYzine (ATARAX/VISTARIL) 25 MG tablet Take 1 tablet (25 mg total) by mouth every 6 (six) hours. 15 tablet 0  . insulin NPH-regular Human (NOVOLIN 70/30) (70-30) 100 UNIT/ML injection Inject 12 Units into the skin 2 (two) times daily with a meal. 10 mL 11  . omeprazole (PRILOSEC OTC) 20 MG tablet Take 20 mg by mouth daily.    . ondansetron (ZOFRAN ODT) 8 MG disintegrating tablet 8mg  ODT q4 hours prn nausea (Patient not taking: Reported on 08/10/2014) 4 tablet  0  . oxyCODONE-acetaminophen (PERCOCET) 5-325 MG per tablet Take 1-2 tablets by mouth every 6 (six) hours as needed.    . polyethylene glycol  (MIRALAX / GLYCOLAX) packet Take 17 g by mouth daily as needed for moderate constipation. 14 each 0   No current facility-administered medications for this visit.     Past Surgical History  Procedure Laterality Date  . Laparoscopic gastrotomy w/ repair of ulcer    . Colonoscopy   01/27/2005    [id number] hemorrhoids, otherwise normal rectum/Normal colon, marginal prep made the exam more difficult  . Esophagogastroduodenoscopy   10/12/2004    [id number] esophagus/Focal area of submucosal petechial hemorrhage in the fundus (likely a trauma-induced phenomenon secondary to vomiting), otherwise normal     No Known Allergies    Family History  Problem Relation Age of Onset  . Diabetes Mother   . Diabetes Father   . Cancer Paternal Grandmother   . Heart attack Maternal Grandfather   . Heart attack Maternal Uncle   . Cancer Maternal Aunt   . Cancer Maternal Uncle      Social History Mr. Chirinos reports that he has been smoking.  He does not have any smokeless tobacco history on file. Mr. Varin reports that he does not drink alcohol.   Review of Systems CONSTITUTIONAL: No weight loss, fever, chills, weakness or fatigue.  HEENT: Eyes: No visual loss, blurred vision, double vision or yellow sclerae.No hearing loss, sneezing, congestion, runny nose or sore throat.  SKIN: No rash or itching.  CARDIOVASCULAR: per HPI RESPIRATORY: No shortness of breath, cough or sputum.  GASTROINTESTINAL: No anorexia, nausea, vomiting or diarrhea. No abdominal pain or blood.  GENITOURINARY: No burning on urination, no polyuria NEUROLOGICAL: No headache, dizziness, syncope, paralysis, ataxia, numbness or tingling in the extremities. No change in bowel or bladder control.  MUSCULOSKELETAL: No muscle, back pain, joint pain or stiffness.  LYMPHATICS: No enlarged nodes. No history of splenectomy.  PSYCHIATRIC: No history of depression or anxiety.  ENDOCRINOLOGIC: No reports of sweating, cold or heat  intolerance. No polyuria or polydipsia.  Marland Kitchen   Physical Examination Filed Vitals:   03/17/16 0935  BP: 184/80  Pulse: 74   Filed Vitals:   03/17/16 0935  Height: 5\' 9"  (1.753 m)  Weight: 191 lb (86.637 kg)    Gen: resting comfortably, no acute distress HEENT: no scleral icterus, pupils equal round and reactive, no palptable cervical adenopathy,  CV: RRR, 3/6 systolic murmu RUSB, no jvd Resp: Clear to auscultation bilaterally GI: abdomen is soft, non-tender, non-distended, normal bowel sounds, no hepatosplenomegaly MSK: extremities are warm, no edema.  Skin: warm, no rash Neuro:  no focal deficits Psych: appropriate affect    Assessment and Plan   1. Heart murmur - we will obtain echo  2. Palpitations - EKG in clinic shows SR isolated PVC - his chart suggest a heavier EtOH history than he reports to me. I have recommended cutting back to follow symptoms, if continue consider home monitor. - f/u echo to evaluate for structural heart disease  3. HTN - he has not increased his lisionpril yet as recommended by his pcp, reminded to do so and monitor bp's   F/u 1 month    Arnoldo Lenis, M.D.

## 2016-03-17 NOTE — Patient Instructions (Signed)
Medication Instructions:   Increase Lisinopril to 20mg  daily - new sent to [facility] today.  Continue all other medications.     Labwork:   Testing/Procedures:  Your physician has requested that you have an echocardiogram. Echocardiography is a painless test that uses sound waves to create images of your heart. It provides your doctor with information about the size and shape of your heart and how well your heart's chambers and valves are working. This procedure takes approximately one hour. There are no restrictions for this procedure.  Office will contact with results via phone or letter.     Follow-Up: 1 month  Any Other Special Instructions Will Be Listed Below (If Applicable).  If you need a refill on your cardiac medications before your next appointment, please call your pharmacy.

## 2016-03-23 ENCOUNTER — Other Ambulatory Visit: Payer: Self-pay

## 2016-03-23 ENCOUNTER — Ambulatory Visit (INDEPENDENT_AMBULATORY_CARE_PROVIDER_SITE_OTHER): Payer: Medicare Other

## 2016-03-23 DIAGNOSIS — R011 Cardiac murmur, unspecified: Secondary | ICD-10-CM | POA: Diagnosis not present

## 2016-03-23 LAB — ECHOCARDIOGRAM COMPLETE
AVPHT: 551 ms
CHL CUP DOP CALC LVOT VTI: 22.9 cm
CHL CUP MV DEC (S): 222
E/e' ratio: 12.02
EWDT: 222 ms
FS: 37 % (ref 28–44)
IV/PV OW: 0.97
LA ID, A-P, ES: 41 mm
LA diam end sys: 41 mm
LA diam index: 1.98 cm/m2
LA vol A4C: 45.1 ml
LA vol: 45.5 mL
LAVOLIN: 22 mL/m2
LV E/e' medial: 12.02
LV TDI E'LATERAL: 8.32
LV e' LATERAL: 8.32 cm/s
LVEEAVG: 12.02
LVOT SV: 79 mL
LVOT area: 3.46 cm2
LVOTD: 21 mm
LVOTPV: 108 cm/s
MV Peak grad: 4 mmHg
MV pk A vel: 110 m/s
MV pk E vel: 100 m/s
PW: 11.9 mm — AB (ref 0.6–1.1)
TAPSE: 25.8 mm
TDI e' medial: 5.13

## 2016-03-30 ENCOUNTER — Telehealth: Payer: Self-pay | Admitting: *Deleted

## 2016-03-30 NOTE — Telephone Encounter (Signed)
Pt aware, routed to pcp, confirmed 7/19 appt

## 2016-03-30 NOTE — Telephone Encounter (Signed)
-----   Message from Arnoldo Lenis, MD sent at 03/24/2016  2:01 PM EDT ----- Echo shows overall his heart function is good. One of his heart valves is a little thicker than normal which creates a murmur, but its overall function is fine and its nothing to be concerned about  Zandra Abts MD

## 2016-04-19 ENCOUNTER — Encounter: Payer: Self-pay | Admitting: Cardiology

## 2016-04-19 ENCOUNTER — Ambulatory Visit (INDEPENDENT_AMBULATORY_CARE_PROVIDER_SITE_OTHER): Payer: Medicare Other | Admitting: Cardiology

## 2018-09-17 ENCOUNTER — Institutional Professional Consult (permissible substitution): Payer: Medicare Other | Admitting: Pulmonary Disease

## 2018-10-09 ENCOUNTER — Ambulatory Visit (INDEPENDENT_AMBULATORY_CARE_PROVIDER_SITE_OTHER): Payer: Self-pay | Admitting: Pulmonary Disease

## 2018-10-09 ENCOUNTER — Encounter: Payer: Self-pay | Admitting: Pulmonary Disease

## 2018-10-09 VITALS — BP 140/76 | HR 85 | Ht 69.0 in | Wt 182.2 lb

## 2018-10-09 DIAGNOSIS — R918 Other nonspecific abnormal finding of lung field: Secondary | ICD-10-CM

## 2018-10-09 NOTE — Patient Instructions (Addendum)
Multiple lung nodules These lung nodules may represent inflammation, infection or neoplasm.  Due to your smoking history and the size of the nodules, I recommend repeat CT scan and based on the findings we will discuss the need for further testing with biopsy or continued surveillance.  --Our office will request CT chest from 06/12/2018 --Order CT scan without contrast to be scheduled in early March 2020

## 2018-10-09 NOTE — Progress Notes (Signed)
Synopsis: Referred in 10/2018 for pulmonary lung nodules   Subjective:   PATIENT ID: Dylan Cline GENDER: male DOB: 1951/09/08, MRN: [id number]   HPI  Chief Complaint  Patient presents with  . Consult    Patient unsure why he was referred.  Occ feels SOB or breathes 'loud' - not always related to activity   [name] is a 68 year old male current smoker with history of hypertension, type 2 diabetes, alcoholism, pancreatitis who presents for pulmonary nodules. He is a poor historian.  He reports that is an active smoker, smoking 1 ppd since he was a teenager. Has shortness of breath with moderate exertion but denies limitation in activity. He states that he was told to come to this appointment by his PCP but unclear why because he felt fine. On review of his records from [facility], he had a CT Chest completed on 06/02/18 that demonstrated multiple pulmonary nodules with the largest measuring 8mm. He does not recall undergoing the CT and discussing the results with anyone.   Reports poor appetite and 10lb weightloss in the last 3 months. Reports general fatigue but still able to go to work. Denies unexplained fevers, chills, night sweats, cough, hemoptysis, chest pain, nausea, abdominal pain, or changes in bowel/urinary habits.  Social History: Active smoker.  40-pack-year history  Environmental exposures: Denies any known exposure.   I have personally reviewed patient's past medical/family/social history, allergies, current medications.  Past Medical History:  Diagnosis Date  . Acute type 2 diabetes mellitus with manifestations (HCC)   . Hypertension   . Pancreatitis   . Pancreatitis, alcoholic, acute 08/10/2014     Family History  Problem Relation Age of Onset  . Diabetes Mother   . Diabetes Father   . Heart attack Maternal Grandfather   . Cancer Paternal Grandmother   . Heart attack Maternal Uncle   . Cancer Maternal Aunt   . Cancer Maternal Uncle      Social  History   Occupational History  . Not on file  Tobacco Use  . Smoking status: Current Every Day Smoker    Packs/day: 1.00    Years: 40.00    Pack years: 40.00    Types: Cigarettes    Start date: 12/01/1970  . Smokeless tobacco: Never Used  Substance and Sexual Activity  . Alcohol use: Yes    Alcohol/week: 0.0 standard drinks    Comment: wine daily one pint  . Drug use: No  . Sexual activity: Not on file    No Known Allergies   Outpatient Medications Prior to Visit  Medication Sig Dispense Refill  . atorvastatin (LIPITOR) 40 MG tablet Take 40 mg by mouth daily.    . hydrochlorothiazide (MICROZIDE) 12.5 MG capsule Take 12.5 mg by mouth daily.    . metFORMIN (GLUCOPHAGE-XR) 500 MG 24 hr tablet Take 4 tablets by mouth every evening.    Marland Kitchen omeprazole (PRILOSEC) 20 MG capsule Take 1 capsule by mouth daily.    Marland Kitchen atorvastatin (LIPITOR) 80 MG tablet Take 1 tablet by mouth daily.    Marland Kitchen lisinopril (PRINIVIL,ZESTRIL) 20 MG tablet Take 1 tablet (20 mg total) by mouth daily. 30 tablet 6   No facility-administered medications prior to visit.     Review of Systems  Constitutional: Negative for chills, diaphoresis, fever, malaise/fatigue and weight loss.  HENT: Negative for congestion and sore throat.   Respiratory: Negative for cough, hemoptysis, sputum production, shortness of breath and wheezing.   Cardiovascular: Negative for chest pain,  orthopnea, leg swelling and PND.  Gastrointestinal: Negative for abdominal pain, heartburn and nausea.  Genitourinary: Negative for frequency.  Musculoskeletal: Negative for myalgias.  Skin: Negative for rash.  Neurological: Negative for dizziness, weakness and headaches.  Endo/Heme/Allergies: Does not bruise/bleed easily.    Objective:   Vitals:   10/09/18 1434  BP: 140/76  Pulse: 85  SpO2: 99%  Weight: 182 lb 3.2 oz (82.6 kg)  Height: 5\' 9"  (1.753 m)   SpO2: 99 % O2 Device: None (Room air)  Physical Exam General: Well-appearing, no acute  distress HENT: [place], AT, OP clear, MMM Eyes: EOMI, no scleral icterus Respiratory: Clear to auscultation bilaterally.  No crackles, wheezing or rales Cardiovascular: RRR, -M/R/G, no JVD GI: BS+, soft, nontender Extremities:-Edema,-tenderness Neuro: AAO x4, CNII-XII grossly intact Skin: Intact, no rashes or bruising Psych: Normal mood, normal affect  Chest imaging: CT Chest 06/12/2018 [facility] (report only, no imaging available) FINDINGS:   - Lower Neck: Unremarkable.   - Heart and Vessels: Normal heart size. No pericardial effusion. Normal caliber thoracic aorta and main pulmonary artery. Mild coronary artery calcification.  - Mediastinum, Hila and Axilla: Mildly enlarged AP window lymph node measuring 1.4 x 1.2 cm (image 37).   - Lungs: Central airways are patent. Mild predominant paraseptal and centrilobular emphysema. There is an 8 mm right upper lobe nodule adjacent to the fissure (series 102, image 148), 6 mm anterior right upper lobe nodule (image 148), 6 mm left lower  lobe nodule (image 185), and 6 mm left lower lobe nodule (image 267).   - Pleura: No pleural effusion or pneumothorax.   - Upper Abdomen: Diverticulosis.   - Musculoskeletal: No acute or aggressive osseous abnormalities. Multilevel degenerative disc disease.  - Miscellaneous: N/A. Impression: 1. Mild emphysema. 2. Multiple pulmonary nodules, the largest measuring 8 mm. If the patient is low risk for lung cancer, recommend follow-up CT at 3-6 months and optional follow-up CT at 18-24 months. If the patient is high risk for lung cancer, recommend follow-up CT  at 3-6 months and again at 18-24 months to ensure stability. (2017 Fleischner Guidelines).  3. Nonspecific mildly enlarged AP window lymph node. This could potentially be reactive.  CT abdomen pelvis 05/24/2013-lower lobes with minimal atelectasis in the bases CXR 10/28/2011-normal chest x-ray.  No infiltrates or effusion.  PFT: None on  file  Imaging, labs and test noted above have been reviewed independently by me.    Assessment & Plan:   Multiple lung nodules  Discussion: These lung nodules may represent inflammation, infection or neoplasm.  Due to your smoking history and the size of the nodules, I recommend repeat CT scan and based on the findings we will discuss the need for further testing with biopsy or continued surveillance.  Multiple lung nodules I have reviewed the CT chest report on 06/12/2018 from [facility] with the following findings: 8 mm right upper lobe nodule adjacent to the fissure  6 mm anterior right upper lobe nodule 6 mm left lower lobe nodule  6 mm left lower lobe nodule okay  --Our office will request CT chest from 06/12/2018 --Order CT scan without contrast to be scheduled in early March 2020  Orders Placed This Encounter  Procedures  . CT Chest Wo Contrast    Standing Status:   Future    Standing Expiration Date:   12/08/2019    Scheduling Instructions:     Schedule for Early MARCH 2020    Order Specific Question:   ** REASON FOR EXAM (  FREE TEXT)    Answer:   multiple lung nodules    Order Specific Question:   Preferred imaging location?    Answer:   [place] [facility] - [facility]    Order Specific Question:   Radiology Contrast Protocol - do NOT remove file path    Answer:   [email or web address]  No orders of the defined types were placed in this encounter.   Return in about 2 months (around 12/08/2018).   Greater than 50% of this patient 60-minute office visit was spent face-to-face counseling on the above imaging, recommendations and treatment plan.    Kameryn Davern Mechele Collin, MD [facility] 10/09/2018 10:34 PM  Personal pager: [phone] If unanswered, please page CCM On-call: #[phone]

## 2018-12-11 ENCOUNTER — Encounter (HOSPITAL_COMMUNITY): Payer: Self-pay

## 2018-12-11 ENCOUNTER — Telehealth: Payer: Self-pay | Admitting: Pulmonary Disease

## 2018-12-11 ENCOUNTER — Other Ambulatory Visit: Payer: Self-pay

## 2018-12-11 ENCOUNTER — Ambulatory Visit (HOSPITAL_COMMUNITY)
Admission: RE | Admit: 2018-12-11 | Discharge: 2018-12-11 | Disposition: A | Discharge status: Home / Self Care | Payer: Medicare HMO | Source: Ambulatory Visit | Attending: Pulmonary Disease | Admitting: Pulmonary Disease

## 2018-12-11 DIAGNOSIS — R918 Other nonspecific abnormal finding of lung field: Secondary | ICD-10-CM

## 2018-12-11 NOTE — Telephone Encounter (Signed)
Routing encounter to [place] as she handles the precerts that need to be done. Libby, please advise if there is anything we need to do in regards to this. Thanks!

## 2018-12-13 NOTE — Progress Notes (Signed)
Dr. Gabriel Carina called and said they have acquired Pre-Auth for CT now scheduled 12-19-2018.

## 2018-12-13 NOTE — Sedation Documentation (Deleted)
Dr. Gabriel Carina called and said they have acquired Pre- Auth of CT that is now scheduled for 12-19-2018.

## 2018-12-13 NOTE — Telephone Encounter (Signed)
precert has been completed [id number] and [place] ct has been notified Joellen Jersey

## 2018-12-19 ENCOUNTER — Ambulatory Visit (HOSPITAL_COMMUNITY): Payer: Medicare HMO

## 2018-12-23 ENCOUNTER — Ambulatory Visit: Payer: Self-pay | Admitting: Pulmonary Disease

## 2019-01-24 ENCOUNTER — Other Ambulatory Visit: Payer: Self-pay

## 2019-01-24 ENCOUNTER — Ambulatory Visit (HOSPITAL_COMMUNITY)
Admission: RE | Admit: 2019-01-24 | Discharge: 2019-01-24 | Disposition: A | Discharge status: Home / Self Care | Payer: Medicare HMO | Source: Ambulatory Visit | Attending: Pulmonary Disease | Admitting: Pulmonary Disease

## 2019-01-24 DIAGNOSIS — R918 Other nonspecific abnormal finding of lung field: Secondary | ICD-10-CM | POA: Insufficient documentation

## 2019-01-27 NOTE — Progress Notes (Signed)
Previous pulmonary nodules have resolved. Only two tiny pulmonary nodules <34mm are present. His nodules from before likely represent inflammation that has now mostly resolved. No further studies needed at this time.  Please contact patient with results above.

## 2019-04-09 ENCOUNTER — Other Ambulatory Visit: Payer: Self-pay

## 2019-04-09 ENCOUNTER — Ambulatory Visit: Payer: Medicare HMO | Admitting: Primary Care

## 2019-04-09 ENCOUNTER — Encounter: Payer: Self-pay | Admitting: Primary Care

## 2019-04-09 DIAGNOSIS — R918 Other nonspecific abnormal finding of lung field: Secondary | ICD-10-CM

## 2019-04-09 DIAGNOSIS — Z720 Tobacco use: Secondary | ICD-10-CM

## 2019-04-09 NOTE — Addendum Note (Signed)
Addended by: Valerie Salts on: 04/09/2019 03:16 PM   Modules accepted: Orders

## 2019-04-09 NOTE — Assessment & Plan Note (Addendum)
-   Repeat CT chest in April showed previous pulmonary nodules have resolved. Tiny 62mm nodules in left lower lobe remain, nonspecific and likely benign. Mild paraseptal emphysema. No follow up needed if low risk

## 2019-04-09 NOTE — Patient Instructions (Addendum)
Repeat CT scan showed previous pulmonary nodules have resolved. Tiny 19mm nodules in left lower lobe remain, nonspecific and likely benign. Mild paraseptal emphysema  Refer to lung cancer screening program  Encourage you to quit smoking!!  Follow-up: - Dr. Loanne Drilling in 6 months with PFTs prior      Steps to Quit Smoking Smoking tobacco is the leading cause of preventable death. It can affect almost every organ in the body. Smoking puts you and people around you at risk for many serious, long-lasting (chronic) diseases. Quitting smoking can be hard, but it is one of the best things that you can do for your health. It is never too late to quit. How do I get ready to quit? When you decide to quit smoking, make a plan to help you succeed. Before you quit:  Pick a date to quit. Set a date within the next 2 weeks to give you time to prepare.  Write down the reasons why you are quitting. Keep this list in places where you will see it often.  Tell your family, friends, and co-workers that you are quitting. Their support is important.  Talk with your doctor about the choices that may help you quit.  Find out if your health insurance will pay for these treatments.  Know the people, places, things, and activities that make you want to smoke (triggers). Avoid them. What first steps can I take to quit smoking?  Throw away all cigarettes at home, at work, and in your car.  Throw away the things that you use when you smoke, such as ashtrays and lighters.  Clean your car. Make sure to empty the ashtray.  Clean your home, including curtains and carpets. What can I do to help me quit smoking? Talk with your doctor about taking medicines and seeing a counselor at the same time. You are more likely to succeed when you do both.  If you are pregnant or breastfeeding, talk with your doctor about counseling or other ways to quit smoking. Do not take medicine to help you quit smoking unless your doctor  tells you to do so. To quit smoking: Quit right away  Quit smoking totally, instead of slowly cutting back on how much you smoke over a period of time.  Go to counseling. You are more likely to quit if you go to counseling sessions regularly. Take medicine You may take medicines to help you quit. Some medicines need a prescription, and some you can buy over-the-counter. Some medicines may contain a drug called nicotine to replace the nicotine in cigarettes. Medicines may:  Help you to stop having the desire to smoke (cravings).  Help to stop the problems that come when you stop smoking (withdrawal symptoms). Your doctor may ask you to use:  Nicotine patches, gum, or lozenges.  Nicotine inhalers or sprays.  Non-nicotine medicine that is taken by mouth. Find resources Find resources and other ways to help you quit smoking and remain smoke-free after you quit. These resources are most helpful when you use them often. They include:  Online chats with a Social worker.  Phone quitlines.  Printed Furniture conservator/restorer.  Support groups or group counseling.  Text messaging programs.  Mobile phone apps. Use apps on your mobile phone or tablet that can help you stick to your quit plan. There are many free apps for mobile phones and tablets as well as websites. Examples include Quit Guide from the [employer] and smokefree.gov  What things can I do to make it  easier to quit?   Talk to your family and friends. Ask them to support and encourage you.  Call a phone quitline (1-800-QUIT-NOW), reach out to support groups, or work with a Social worker.  Ask people who smoke to not smoke around you.  Avoid places that make you want to smoke, such as: ? Bars. ? Parties. ? Smoke-break areas at work.  Spend time with people who do not smoke.  Lower the stress in your life. Stress can make you want to smoke. Try these things to help your stress: ? Getting regular exercise. ? Doing deep-breathing  exercises. ? Doing yoga. ? Meditating. ? Doing a body scan. To do this, close your eyes, focus on one area of your body at a time from head to toe. Notice which parts of your body are tense. Try to relax the muscles in those areas. How will I feel when I quit smoking? Day 1 to 3 weeks Within the first 24 hours, you may start to have some problems that come from quitting tobacco. These problems are very bad 2-3 days after you quit, but they do not often last for more than 2-3 weeks. You may get these symptoms:  Mood swings.  Feeling restless, nervous, angry, or annoyed.  Trouble concentrating.  Dizziness.  Strong desire for high-sugar foods and nicotine.  Weight gain.  Trouble pooping (constipation).  Feeling like you may vomit (nausea).  Coughing or a sore throat.  Changes in how the medicines that you take for other issues work in your body.  Depression.  Trouble sleeping (insomnia). Week 3 and afterward After the first 2-3 weeks of quitting, you may start to notice more positive results, such as:  Better sense of smell and taste.  Less coughing and sore throat.  Slower heart rate.  Lower blood pressure.  Clearer skin.  Better breathing.  Fewer sick days. Quitting smoking can be hard. Do not give up if you fail the first time. Some people need to try a few times before they succeed. Do your best to stick to your quit plan, and talk with your doctor if you have any questions or concerns. Summary  Smoking tobacco is the leading cause of preventable death. Quitting smoking can be hard, but it is one of the best things that you can do for your health.  When you decide to quit smoking, make a plan to help you succeed.  Quit smoking right away, not slowly over a period of time.  When you start quitting, seek help from your doctor, family, or friends. This information is not intended to replace advice given to you by your health care provider. Make sure you discuss  any questions you have with your health care provider. Document Released: 07/15/2009 Document Revised: 12/06/2018 Document Reviewed: 12/07/2018 Elsevier Patient Education  2020 [employer].

## 2019-04-09 NOTE — Progress Notes (Signed)
@Patient  ID: Dylan Cline, male    DOB: 31-Mar-1951, 68 y.o.   MRN: [id number]  Chief Complaint  Patient presents with  . Follow-up    5 month follow up for nodules. States his breathing has been ok since last visit. Denies any increased coughing, wheezing or SOB.     Referring provider: Wendie Simmer, MD  HPI: 68 year old male, current smoker (40 pack years). PMH significant for HTN, diabetes, alcoholism, pancreatitis, pulmonary nodules. Patient of Dr. Loanne Drilling, seen for initial consult on 10/09/18 for abnormal chest imaging/mulitple pulmonary nodules. No PFTs on file.   04/09/2019 Patient presents today for follow-up from consult. He is feeling fine, no complaints. Denies sob, wheezing or cough. Mild dyspnea depending on how far or fast he walks but does not impair his life. He had CT chest completed in September at [facility] that demonstrated multiple pulmonary nodules with the largest measuring 29mm. Repeat CT chest in April showed previous pulmonary nodules have resolved. Tiny 85mm nodules in left lower lobe remain, nonspecific and likely benign. Mild paraseptal emphysema. No further follow-up recommended.   No Known Allergies   There is no immunization history on file for this patient.  Past Medical History:  Diagnosis Date  . Acute type 2 diabetes mellitus with manifestations ([place])   . Hypertension   . Pancreatitis   . Pancreatitis, alcoholic, acute 73/04/1061    Tobacco History: Social History   Tobacco Use  Smoking Status Current Every Day Smoker  . Packs/day: 1.00  . Years: 40.00  . Pack years: 40.00  . Types: Cigarettes  . Start date: 12/01/1970  Smokeless Tobacco Never Used   Ready to quit: Not Answered Counseling given: Not Answered   Outpatient Medications Prior to Visit  Medication Sig Dispense Refill  . atorvastatin (LIPITOR) 40 MG tablet Take 40 mg by mouth daily.    . hydrochlorothiazide (HYDRODIURIL) 12.5 MG tablet TAKE 1 TABLET BY MOUTH ONCE DAILY FOR 90  DAYS    . lisinopril (ZESTRIL) 40 MG tablet Take 40 mg by mouth daily.    . metFORMIN (GLUCOPHAGE) 1000 MG tablet TAKE 2 TABLETS BY MOUTH ONCE DAILY WITH EVENING MEAL    . omeprazole (PRILOSEC) 20 MG capsule Take 1 capsule by mouth daily.    . metFORMIN (GLUCOPHAGE-XR) 500 MG 24 hr tablet Take 4 tablets by mouth every evening.    . hydrochlorothiazide (MICROZIDE) 12.5 MG capsule Take 12.5 mg by mouth daily.     No facility-administered medications prior to visit.    Review of Systems  Review of Systems  Constitutional: Negative.   HENT: Negative.   Respiratory: Negative for cough, shortness of breath and wheezing.        DOE with long distance  Cardiovascular: Negative.     Physical Exam  BP 122/82 (BP Location: Left Arm, Patient Position: Sitting, Cuff Size: Normal)   Pulse 85   Temp 98 F (36.7 C) (Oral)   Ht 5\' 9"  (1.753 m)   Wt 170 lb 12.8 oz (77.5 kg)   SpO2 100%   BMI 25.22 kg/m  Physical Exam Constitutional:      General: He is not in acute distress.    Appearance: He is well-developed and normal weight. He is not ill-appearing.  HENT:     Head: Normocephalic and atraumatic.  Eyes:     Pupils: Pupils are equal, round, and reactive to light.  Neck:     Musculoskeletal: Normal range of motion and neck supple.  Cardiovascular:  Rate and Rhythm: Normal rate and regular rhythm.     Heart sounds: Normal heart sounds.  Pulmonary:     Effort: Pulmonary effort is normal. No respiratory distress.     Breath sounds: Normal breath sounds. No wheezing.     Comments: CTA Abdominal:     Tenderness: There is no abdominal tenderness.  Musculoskeletal: Normal range of motion.  Skin:    General: Skin is warm and dry.     Findings: No erythema or rash.  Neurological:     General: No focal deficit present.     Mental Status: He is alert and oriented to person, place, and time. Mental status is at baseline.  Psychiatric:        Mood and Affect: Mood normal.         Behavior: Behavior normal.      Lab Results:  CBC    Component Value Date/Time   WBC 8.6 08/12/2014 0610   RBC 4.46 08/12/2014 0610   HGB 15.1 08/12/2014 0610   HCT 41.5 08/12/2014 0610   PLT 200 08/12/2014 0610   MCV 93.0 08/12/2014 0610   MCH 33.9 08/12/2014 0610   MCHC 36.4 (H) 08/12/2014 0610   RDW 11.9 08/12/2014 0610   LYMPHSABS 3.6 08/10/2014 2050   MONOABS 0.5 08/10/2014 2050   EOSABS 0.1 08/10/2014 2050   BASOSABS 0.0 08/10/2014 2050    BMET    Component Value Date/Time   NA 135 (L) 08/12/2014 0610   K 4.2 08/12/2014 0610   CL 101 08/12/2014 0610   CO2 23 08/12/2014 0610   GLUCOSE 275 (H) 08/12/2014 0610   BUN 14 08/12/2014 0610   CREATININE 0.86 08/12/2014 0610   CALCIUM 8.1 (L) 08/12/2014 0610   GFRNONAA 90 (L) 08/12/2014 0610   GFRAA >90 08/12/2014 0610    BNP No results found for: BNP  ProBNP No results found for: PROBNP  Imaging: No results found.   Assessment & Plan:   Multiple lung nodules - Repeat CT chest in April showed previous pulmonary nodules have resolved. Tiny 56mm nodules in left lower lobe remain, nonspecific and likely benign. Mild paraseptal emphysema. No follow up needed if low risk  Tobacco use - Current smoker, >40 pack years - Smoking cessation encouraged - Refer to lung cancer screening program  - FU in 6 months with PFTs   Martyn Ehrich, NP 04/09/2019

## 2019-04-09 NOTE — Assessment & Plan Note (Addendum)
-   Current smoker, >40 pack years - Smoking cessation encouraged - Refer to lung cancer screening program  - FU in 6 months with PFTs

## 2019-04-29 ENCOUNTER — Other Ambulatory Visit: Payer: Self-pay

## 2019-04-29 ENCOUNTER — Other Ambulatory Visit: Payer: Self-pay | Admitting: Nurse Practitioner

## 2019-04-29 ENCOUNTER — Emergency Department (HOSPITAL_COMMUNITY): Payer: Medicare HMO

## 2019-04-29 ENCOUNTER — Inpatient Hospital Stay (HOSPITAL_COMMUNITY)
Admission: EM | Admit: 2019-04-29 | Discharge: 2019-05-05 | DRG: 684 | Disposition: A | Discharge status: Home / Self Care | Payer: Medicare HMO | Source: Ambulatory Visit | Attending: Internal Medicine | Admitting: Internal Medicine

## 2019-04-29 ENCOUNTER — Ambulatory Visit (INDEPENDENT_AMBULATORY_CARE_PROVIDER_SITE_OTHER): Payer: Medicare HMO | Admitting: Nurse Practitioner

## 2019-04-29 ENCOUNTER — Telehealth: Payer: Self-pay | Admitting: *Deleted

## 2019-04-29 ENCOUNTER — Encounter (HOSPITAL_COMMUNITY): Payer: Self-pay | Admitting: Emergency Medicine

## 2019-04-29 ENCOUNTER — Encounter: Payer: Self-pay | Admitting: Nurse Practitioner

## 2019-04-29 ENCOUNTER — Other Ambulatory Visit (HOSPITAL_COMMUNITY)
Admission: RE | Admit: 2019-04-29 | Discharge: 2019-04-29 | Disposition: A | Discharge status: Home / Self Care | Payer: Medicare HMO | Source: Ambulatory Visit | Attending: Nurse Practitioner | Admitting: Nurse Practitioner

## 2019-04-29 ENCOUNTER — Ambulatory Visit (HOSPITAL_COMMUNITY): Admission: RE | Admit: 2019-04-29 | Payer: Medicare HMO | Source: Ambulatory Visit | Admitting: *Deleted

## 2019-04-29 VITALS — BP 110/70 | HR 107 | Temp 97.1°F | Ht 69.0 in | Wt 165.8 lb

## 2019-04-29 DIAGNOSIS — R634 Abnormal weight loss: Secondary | ICD-10-CM

## 2019-04-29 DIAGNOSIS — E0822 Diabetes mellitus due to underlying condition with diabetic chronic kidney disease: Secondary | ICD-10-CM

## 2019-04-29 DIAGNOSIS — E869 Volume depletion, unspecified: Secondary | ICD-10-CM | POA: Diagnosis present

## 2019-04-29 DIAGNOSIS — R1013 Epigastric pain: Secondary | ICD-10-CM | POA: Diagnosis present

## 2019-04-29 DIAGNOSIS — E875 Hyperkalemia: Secondary | ICD-10-CM | POA: Diagnosis present

## 2019-04-29 DIAGNOSIS — R109 Unspecified abdominal pain: Secondary | ICD-10-CM | POA: Insufficient documentation

## 2019-04-29 DIAGNOSIS — I2510 Atherosclerotic heart disease of native coronary artery without angina pectoris: Secondary | ICD-10-CM | POA: Diagnosis present

## 2019-04-29 DIAGNOSIS — R1033 Periumbilical pain: Secondary | ICD-10-CM | POA: Diagnosis not present

## 2019-04-29 DIAGNOSIS — N179 Acute kidney failure, unspecified: Secondary | ICD-10-CM | POA: Diagnosis not present

## 2019-04-29 DIAGNOSIS — N183 Chronic kidney disease, stage 3 (moderate): Secondary | ICD-10-CM | POA: Diagnosis present

## 2019-04-29 DIAGNOSIS — E785 Hyperlipidemia, unspecified: Secondary | ICD-10-CM | POA: Diagnosis present

## 2019-04-29 DIAGNOSIS — K219 Gastro-esophageal reflux disease without esophagitis: Secondary | ICD-10-CM | POA: Insufficient documentation

## 2019-04-29 DIAGNOSIS — Z7984 Long term (current) use of oral hypoglycemic drugs: Secondary | ICD-10-CM

## 2019-04-29 DIAGNOSIS — K852 Alcohol induced acute pancreatitis without necrosis or infection: Secondary | ICD-10-CM

## 2019-04-29 DIAGNOSIS — I129 Hypertensive chronic kidney disease with stage 1 through stage 4 chronic kidney disease, or unspecified chronic kidney disease: Secondary | ICD-10-CM | POA: Diagnosis present

## 2019-04-29 DIAGNOSIS — T464X5A Adverse effect of angiotensin-converting-enzyme inhibitors, initial encounter: Secondary | ICD-10-CM | POA: Diagnosis present

## 2019-04-29 DIAGNOSIS — D490 Neoplasm of unspecified behavior of digestive system: Secondary | ICD-10-CM | POA: Diagnosis present

## 2019-04-29 DIAGNOSIS — E1122 Type 2 diabetes mellitus with diabetic chronic kidney disease: Secondary | ICD-10-CM | POA: Diagnosis present

## 2019-04-29 DIAGNOSIS — J449 Chronic obstructive pulmonary disease, unspecified: Secondary | ICD-10-CM | POA: Diagnosis present

## 2019-04-29 DIAGNOSIS — Z20828 Contact with and (suspected) exposure to other viral communicable diseases: Secondary | ICD-10-CM | POA: Diagnosis present

## 2019-04-29 DIAGNOSIS — R1010 Upper abdominal pain, unspecified: Secondary | ICD-10-CM

## 2019-04-29 DIAGNOSIS — F1020 Alcohol dependence, uncomplicated: Secondary | ICD-10-CM | POA: Diagnosis present

## 2019-04-29 DIAGNOSIS — F17210 Nicotine dependence, cigarettes, uncomplicated: Secondary | ICD-10-CM | POA: Diagnosis present

## 2019-04-29 DIAGNOSIS — Z6825 Body mass index (BMI) 25.0-25.9, adult: Secondary | ICD-10-CM

## 2019-04-29 DIAGNOSIS — I714 Abdominal aortic aneurysm, without rupture: Secondary | ICD-10-CM | POA: Diagnosis present

## 2019-04-29 DIAGNOSIS — Z833 Family history of diabetes mellitus: Secondary | ICD-10-CM

## 2019-04-29 DIAGNOSIS — R8281 Pyuria: Secondary | ICD-10-CM | POA: Diagnosis present

## 2019-04-29 DIAGNOSIS — T39395A Adverse effect of other nonsteroidal anti-inflammatory drugs [NSAID], initial encounter: Secondary | ICD-10-CM | POA: Diagnosis present

## 2019-04-29 DIAGNOSIS — E1142 Type 2 diabetes mellitus with diabetic polyneuropathy: Secondary | ICD-10-CM | POA: Diagnosis present

## 2019-04-29 DIAGNOSIS — E119 Type 2 diabetes mellitus without complications: Secondary | ICD-10-CM

## 2019-04-29 LAB — URINALYSIS, ROUTINE W REFLEX MICROSCOPIC
Bacteria, UA: NONE SEEN
Bilirubin Urine: NEGATIVE
Glucose, UA: NEGATIVE mg/dL
Ketones, ur: NEGATIVE mg/dL
Nitrite: NEGATIVE
Protein, ur: 30 mg/dL — AB
Specific Gravity, Urine: 1.026 (ref 1.005–1.030)
pH: 5 (ref 5.0–8.0)

## 2019-04-29 LAB — COMPREHENSIVE METABOLIC PANEL
ALT: 16 U/L (ref 0–44)
ALT: 14 U/L (ref 0–44)
AST: 17 U/L (ref 15–41)
AST: 17 U/L (ref 15–41)
Albumin: 4.5 g/dL (ref 3.5–5.0)
Albumin: 4.5 g/dL (ref 3.5–5.0)
Alkaline Phosphatase: 64 U/L (ref 38–126)
Alkaline Phosphatase: 65 U/L (ref 38–126)
Anion gap: 12 (ref 5–15)
Anion gap: 10 (ref 5–15)
BUN: 53 mg/dL — ABNORMAL HIGH (ref 8–23)
BUN: 56 mg/dL — ABNORMAL HIGH (ref 8–23)
CO2: 15 mmol/L — ABNORMAL LOW (ref 22–32)
CO2: 16 mmol/L — ABNORMAL LOW (ref 22–32)
Calcium: 9.7 mg/dL (ref 8.9–10.3)
Calcium: 9.6 mg/dL (ref 8.9–10.3)
Chloride: 110 mmol/L (ref 98–111)
Chloride: 110 mmol/L (ref 98–111)
Creatinine, Ser: 5.03 mg/dL — ABNORMAL HIGH (ref 0.61–1.24)
Creatinine, Ser: 5.18 mg/dL — ABNORMAL HIGH (ref 0.61–1.24)
GFR calc Af Amer: 13 mL/min — ABNORMAL LOW (ref >60)
GFR calc Af Amer: 12 mL/min — ABNORMAL LOW (ref >60)
GFR calc non Af Amer: 11 mL/min — ABNORMAL LOW (ref >60)
GFR calc non Af Amer: 10 mL/min — ABNORMAL LOW (ref >60)
Glucose, Bld: 127 mg/dL — ABNORMAL HIGH (ref 70–99)
Glucose, Bld: 173 mg/dL — ABNORMAL HIGH (ref 70–99)
Potassium: 6.5 mmol/L (ref 3.5–5.1)
Potassium: 6 mmol/L — ABNORMAL HIGH (ref 3.5–5.1)
Sodium: 137 mmol/L (ref 135–145)
Sodium: 136 mmol/L (ref 135–145)
Total Bilirubin: 0.3 mg/dL (ref 0.3–1.2)
Total Bilirubin: 0.2 mg/dL — ABNORMAL LOW (ref 0.3–1.2)
Total Protein: 8.5 g/dL — ABNORMAL HIGH (ref 6.5–8.1)
Total Protein: 8.6 g/dL — ABNORMAL HIGH (ref 6.5–8.1)

## 2019-04-29 LAB — CBC WITH DIFFERENTIAL/PLATELET
Abs Immature Granulocytes: 0.02 10*3/uL (ref 0.00–0.07)
Basophils Absolute: 0.1 10*3/uL (ref 0.0–0.1)
Basophils Relative: 1 %
Eosinophils Absolute: 0.1 10*3/uL (ref 0.0–0.5)
Eosinophils Relative: 1 %
HCT: 51.4 % (ref 39.0–52.0)
Hemoglobin: 17.3 g/dL — ABNORMAL HIGH (ref 13.0–17.0)
Immature Granulocytes: 0 %
Lymphocytes Relative: 42 %
Lymphs Abs: 3.9 10*3/uL (ref 0.7–4.0)
MCH: 34.1 pg — ABNORMAL HIGH (ref 26.0–34.0)
MCHC: 33.7 g/dL (ref 30.0–36.0)
MCV: 101.2 fL — ABNORMAL HIGH (ref 80.0–100.0)
Monocytes Absolute: 0.9 10*3/uL (ref 0.1–1.0)
Monocytes Relative: 10 %
Neutro Abs: 4.3 10*3/uL (ref 1.7–7.7)
Neutrophils Relative %: 46 %
Platelets: 265 10*3/uL (ref 150–400)
RBC: 5.08 MIL/uL (ref 4.22–5.81)
RDW: 13.2 % (ref 11.5–15.5)
WBC: 9.3 10*3/uL (ref 4.0–10.5)
nRBC: 0 % (ref 0.0–0.2)

## 2019-04-29 LAB — CREATININE, URINE, RANDOM: Creatinine, Urine: 330.59 mg/dL

## 2019-04-29 LAB — SODIUM, URINE, RANDOM: Sodium, Ur: 29 mmol/L

## 2019-04-29 LAB — LIPASE, BLOOD
Lipase: 147 U/L — ABNORMAL HIGH (ref 11–51)
Lipase: 145 U/L — ABNORMAL HIGH (ref 11–51)

## 2019-04-29 LAB — SARS CORONAVIRUS 2 BY RT PCR (HOSPITAL ORDER, PERFORMED IN ~~LOC~~ HOSPITAL LAB): SARS Coronavirus 2: NEGATIVE

## 2019-04-29 MED ORDER — SODIUM BICARBONATE 8.4 % IV SOLN
INTRAVENOUS | Status: AC
  Administered 2019-04-29: 16:00:00 50 meq via INTRAVENOUS
  Filled 2019-04-29: qty 50

## 2019-04-29 MED ORDER — DEXTROSE 50 % IV SOLN
1.0000 | Freq: Once | INTRAVENOUS | Status: AC
  Administered 2019-04-29: 16:00:00 50 mL via INTRAVENOUS

## 2019-04-29 MED ORDER — DEXTROSE 10 % IV SOLN
Freq: Once | INTRAVENOUS | Status: AC
  Administered 2019-04-29: 16:00:00 via INTRAVENOUS

## 2019-04-29 MED ORDER — ATORVASTATIN CALCIUM 40 MG PO TABS
40.0000 mg | ORAL_TABLET | Freq: Every day | ORAL | Status: DC
  Administered 2019-04-29 – 2019-05-05 (×7): 40 mg via ORAL
  Filled 2019-04-29 (×7): qty 1

## 2019-04-29 MED ORDER — SODIUM BICARBONATE 8.4 % IV SOLN
50.0000 meq | Freq: Once | INTRAVENOUS | Status: AC
  Administered 2019-04-29: 50 meq via INTRAVENOUS

## 2019-04-29 MED ORDER — OMEPRAZOLE 40 MG PO CPDR: 40.0000 mg | DELAYED_RELEASE_CAPSULE | Freq: Every day | ORAL | 3 refills | Status: DC

## 2019-04-29 MED ORDER — SODIUM ZIRCONIUM CYCLOSILICATE 5 G PO PACK
10.0000 g | PACK | Freq: Once | ORAL | Status: AC
  Administered 2019-04-29: 16:00:00 10 g via ORAL
  Filled 2019-04-29: qty 2

## 2019-04-29 MED ORDER — INSULIN ASPART 100 UNIT/ML IV SOLN
5.0000 [IU] | Freq: Once | INTRAVENOUS | Status: AC
  Administered 2019-04-29: 16:00:00 5 [IU] via INTRAVENOUS

## 2019-04-29 MED ORDER — SODIUM CHLORIDE 0.9 % IV BOLUS
1000.0000 mL | Freq: Once | INTRAVENOUS | Status: AC
  Administered 2019-04-29: 22:00:00 1000 mL via INTRAVENOUS

## 2019-04-29 MED ORDER — DEXTROSE 50 % IV SOLN
INTRAVENOUS | Status: AC
  Administered 2019-04-29: 16:00:00 50 mL via INTRAVENOUS
  Filled 2019-04-29: qty 50

## 2019-04-29 MED ORDER — PANTOPRAZOLE SODIUM 40 MG PO TBEC
40.0000 mg | DELAYED_RELEASE_TABLET | Freq: Every day | ORAL | Status: DC
  Administered 2019-04-29 – 2019-05-01 (×3): 40 mg via ORAL
  Filled 2019-04-29 (×3): qty 1

## 2019-04-29 NOTE — ED Notes (Signed)
NP from [place] called and reports pt has history of alcoholic pancreatitis.  Reports pt drinks 1 pint/day.  C/o abd pain.  Labs showed lipase 147, K 6.5, and creatinine 5.03.  NP wanted pt to have CT but requested it be done through the ED.

## 2019-04-29 NOTE — Telephone Encounter (Signed)
Received call from [facility], [place]. Patient has critical lab results for Potassium: 6.5.

## 2019-04-29 NOTE — Progress Notes (Signed)
Referring Provider: Wendie Simmer, MD Primary Care Physician:  Wendie Simmer, MD Primary GI:  Dr. Gala Romney  Chief Complaint  Patient presents with  . Abdominal Pain    mid abd, loss of appetite; weight approx 200 lbs 4 months ago    HPI:    Dylan Cline is a 68 y.o. male who presents for "ulcer and abdominal pain."  The patient has not been seen by our office since 2006.  EGD completed 10/12/2004 for epigastric pain, nausea, vomiting and CT of the abdomen revealing cystic appearing mass in the pancreas.  Findings included normal esophagus, focal area of submucosal petechial hemorrhages in the fundus likely secondary to vomiting/trauma induced phenomenon.  Recommended CA 19-9 tumor marker and if elevated proceed with fine-needle aspiration CT-guided biopsy.  CA 19-9 tumor marker does not appear in our results section.  Colonoscopy completed 01/27/2005 which found internal hemorrhoids, normal colon however prep marginal.  Recommended 10-day course of Anusol and follow-up CT with pancreatic protocol.  History of pancreatic pseudocyst which underwent apparent FNA at [facility].  Repeat CT of the abdomen in 2006 found interval decrease in size of pancreatic pseudocyst, no new cysts or discrete solid pancreatic masses.  No new abnormalities.  Most recent CT of the abdomen and pelvis completed 05/24/2013 which found changes of acute pancreatitis involving the head of the pancreas, further decrease in size of small post pancreatitis fluid collections in the distal body and proximal tail the pancreas, stable ventral hernias, proximal bilateral inguinal hernias, mild diffuse hepatic steatosis, colonic diverticulosis.  Today he states he's doing ok overall. Has had decreased appetite and weight loss (from 200 lbs about 4 months ago). Started with abdominal pain last week. Started OTC Prilosec 20 mg and has been taking two (40 mg total) which has not helped  (started taking them about 2 days ago). Does have some GERD symptoms including bitter/sour reflux into his throat. States he was diagnosed with an ulcer by our office "a long time ago." His abdominal pain is mid-abdominal/just above the umbilicus. Pain described as nagging, sharp, dull. Pain described as 8/10. Pain is constant, unless he takes Excedrin. Having difficulty sleeping. Drinks a pint to a pint and a half a day of wine ("[place]"). Denies N/V, hematochezia, melena, fever, chills. Has lost about 35 lbs. Appetite poor, energy level is poor. Denies URI or flu-like symptoms. Denies loss of sense of taste or smell. Occasionally feels like he's going to pass out. Denies chest pain, dyspnea, dizziness, lightheadedness, syncope. Denies any other upper or lower GI symptoms.  Keeps saying "my stomach just feels hot."  Past Medical History:  Diagnosis Date  . Acute type 2 diabetes mellitus with manifestations ([place])   . Hypertension   . Pancreatitis   . Pancreatitis, alcoholic, acute 91/01/7828    Past Surgical History:  Procedure Laterality Date  . COLONOSCOPY   01/27/2005   FAO:ZHYQMVHQ hemorrhoids, otherwise normal rectum/Normal colon, marginal prep made the exam more difficult  . ESOPHAGOGASTRODUODENOSCOPY   10/12/2004   ION:GEXBMW esophagus/Focal area of submucosal petechial hemorrhage in the fundus (likely a trauma-induced phenomenon secondary to vomiting), otherwise normal  . LAPAROSCOPIC GASTROTOMY W/ REPAIR OF ULCER      Current Outpatient Medications  Medication Sig Dispense Refill  . atorvastatin (LIPITOR) 40 MG tablet Take 40 mg by mouth daily.    . hydrochlorothiazide (HYDRODIURIL) 12.5 MG tablet TAKE 1 TABLET BY MOUTH ONCE DAILY FOR 90 DAYS    .  lisinopril (ZESTRIL) 40 MG tablet Take 40 mg by mouth daily.    . metFORMIN (GLUCOPHAGE) 1000 MG tablet TAKE 2 TABLETS BY MOUTH ONCE DAILY WITH EVENING MEAL    . omeprazole (PRILOSEC) 20 MG capsule Take 2 capsules by mouth  daily.      No current facility-administered medications for this visit.     Allergies as of 04/29/2019  . (No Known Allergies)    Family History  Problem Relation Age of Onset  . Diabetes Mother   . Diabetes Father   . Heart attack Maternal Grandfather   . Cancer Paternal Grandmother   . Heart attack Maternal Uncle   . Cancer Maternal Aunt   . Cancer Maternal Uncle   . Colon cancer Neg Hx   . Pancreatic cancer Neg Hx   . Stomach cancer Neg Hx     Social History   Socioeconomic History  . Marital status: Married    Spouse name: Not on file  . Number of children: Not on file  . Years of education: Not on file  . Highest education level: Not on file  Occupational History  . Not on file  Social Needs  . Financial resource strain: Not on file  . Food insecurity    Worry: Not on file    Inability: Not on file  . Transportation needs    Medical: Not on file    Non-medical: Not on file  Tobacco Use  . Smoking status: Current Every Day Smoker    Packs/day: 1.00    Years: 40.00    Pack years: 40.00    Types: Cigarettes    Start date: 12/01/1970  . Smokeless tobacco: Never Used  Substance and Sexual Activity  . Alcohol use: Yes    Alcohol/week: 0.0 standard drinks    Comment: wine daily one pint  . Drug use: No  . Sexual activity: Not on file  Lifestyle  . Physical activity    Days per week: Not on file    Minutes per session: Not on file  . Stress: Not on file  Relationships  . Social Herbalist on phone: Not on file    Gets together: Not on file    Attends religious service: Not on file    Active member of club or organization: Not on file    Attends meetings of clubs or organizations: Not on file    Relationship status: Not on file  Other Topics Concern  . Not on file  Social History Narrative  . Not on file    Review of Systems: General: Negative for anorexia, weight loss, fever, chills. Eyes: Negative for vision changes.  ENT: Negative  for hoarseness, difficulty swallowing. CV: Negative for chest pain, angina, palpitations, peripheral edema.  Respiratory: Negative for dyspnea at rest, cough, sputum, wheezing.  GI: See history of present illness. MS: Negative for joint pain, low back pain.  Derm: Negative for rash or itching.  Endo: Negative for unusual weight change.  Heme: Negative for bruising or bleeding. Allergy: Negative for rash or hives.   Physical Exam: BP 110/70   Pulse (!) 107   Temp (!) 97.1 F (36.2 C) (Oral)   Ht 5\' 9"  (1.753 m)   Wt 165 lb 12.8 oz (75.2 kg)   BMI 24.48 kg/m  General:   Alert and oriented. Pleasant and cooperative. Well-nourished and well-developed.  Head:  Normocephalic and atraumatic. Eyes:  Without icterus, sclera clear and conjunctiva pink.  Ears:  Normal  auditory acuity. Cardiovascular:  S1, S2 present without murmurs appreciated. Extremities without clubbing or edema. Respiratory:  Clear to auscultation bilaterally. No wheezes, rales, or rhonchi. No distress.  Gastrointestinal:  +BS, soft, and non-distended. Periumbilical TTP. No HSM noted. No guarding or rebound. No masses appreciated.  Rectal:  Deferred  Musculoskalatal:  Symmetrical without gross deformities. Neurologic:  Alert and oriented x4;  grossly normal neurologically. Psych:  Alert and cooperative. Normal mood and affect. Heme/Lymph/Immune: No excessive bruising noted.    04/29/2019 9:37 AM   Disclaimer: This note was dictated with voice recognition software. Similar sounding words can inadvertently be transcribed and may not be corrected upon review.

## 2019-04-29 NOTE — Telephone Encounter (Signed)
Patient called back. Made him aware of EG recs. He will head over to the ED now.

## 2019-04-29 NOTE — Telephone Encounter (Signed)
Noted  

## 2019-04-29 NOTE — H&P (Signed)
History and Physical    [name] IRW:[id number] DOB: [birth year] DOA: 04/29/2019  PCP: Wendie Simmer, MD (Confirm with patient/family/NH records and if not entered, this has to be entered at [facility] point of entry) Patient coming from: Patient is coming from home.  I have personally briefly reviewed patient's old medical records in [place]  Chief Complaint: Upper abdominal pain and acute kidney injury by lab data  HPI: Dylan Cline is a 68 y.o. male with medical history significant of pancreatitis in the past, non-insulin-dependent diabetes, hypertension, history of alcohol abuse, GERD.  Patient has been ill for approximately 3 weeks with a 15 to 20 pound weight loss.  Has had a very poor appetite and has had minimal fluids.  He describes his pain as being epigastric in nature and rates it as moderate and similar to previous ulcer related pain.  His primary care physician has already set up an appointment with nephrology because of declining renal function.  Patient saw his gastroenterologist on the day of admission for abdominal pain.  Laboratory drawn at the GI office revealed a creatinine of 5 and a potassium of 6.5.  He was instructed to present to the emergency department for evaluation and probable admission for treatment. Previously the patient had pulmonary nodules that were seen on CT scan and also a CT [facility]  After several months these nodules resolved. He was cleared by pulmonology with no plans for further work-up except smoking cessation.   ED Course: Patient's vital signs were stable.  Lab work did confirm a creatinine of 5.3 and a potassium of 5 glucose was 137.  Potassium was 6.5.  Sodium 137.  Estimated GFR was 11 CBC revealed a hemoglobin of 17.3 g UA revealed 11-20 WBCs.  He was referred for admission  Review of Systems: As per HPI otherwise 10 point review of systems negative. Notable for weight loss, blurred vision  C/w cataracts, peripheral neuropathy  feet.   Past Medical History:  Diagnosis Date  . Acute type 2 diabetes mellitus with manifestations ([place])   . Hypertension   . Pancreatitis   . Pancreatitis, alcoholic, acute 76/10/9507    Past Surgical History:  Procedure Laterality Date  . COLONOSCOPY   01/27/2005   TOI:ZTIWPYKD hemorrhoids, otherwise normal rectum/Normal colon, marginal prep made the exam more difficult  . ESOPHAGOGASTRODUODENOSCOPY   10/12/2004   XIP:JASNKN esophagus/Focal area of submucosal petechial hemorrhage in the fundus (likely a trauma-induced phenomenon secondary to vomiting), otherwise normal  . LAPAROSCOPIC GASTROTOMY W/ REPAIR OF ULCER       reports that he has been smoking cigarettes. He started smoking about 48 years ago. He has a 40.00 pack-year smoking history. He has never used smokeless tobacco. He reports current alcohol use. He reports that he does not use drugs.  No Known Allergies  Family History  Problem Relation Age of Onset  . Diabetes Mother   . Diabetes Father   . Heart attack Maternal Grandfather   . Cancer Paternal Grandmother   . Heart attack Maternal Uncle   . Cancer Maternal Aunt   . Cancer Maternal Uncle   . Colon cancer Neg Hx   . Pancreatic cancer Neg Hx   . Stomach cancer Neg Hx    Social history -high school graduate.  Patient had 2 separate relationships prior to his first marriage.  He had a son from one relationship, a daughter from another relationship.  His first marriage lasted 6 years and he had 2  children 1 son and 1 daughter.  Patient remarried in 2001 to his current wife.  He did lose 1 daughter to illness.  He does have approximately 20 grandchildren.  Patient lives alone with his wife.  He is currently retired.  He worked mostly in Theatre manager.  Prior to Admission medications   Medication Sig Start Date End Date Taking? Authorizing Provider  atorvastatin (LIPITOR) 40 MG tablet Take 40 mg by mouth daily.   Yes [provider]  hydrochlorothiazide  (HYDRODIURIL) 12.5 MG tablet TAKE 1 TABLET BY MOUTH ONCE DAILY FOR 90 DAYS 03/11/19  Yes [provider]  lisinopril (ZESTRIL) 40 MG tablet Take 40 mg by mouth daily. 03/10/19  Yes [provider]  metFORMIN (GLUCOPHAGE) 1000 MG tablet TAKE 2 TABLETS BY MOUTH ONCE DAILY WITH EVENING MEAL 03/10/19  Yes [provider]  omeprazole (PRILOSEC) 40 MG capsule Take 1 capsule (40 mg total) by mouth daily. 04/29/19  Yes Carlis Stable, NP    Physical Exam: Vitals:   04/29/19 1530 04/29/19 1630 04/29/19 1700 04/29/19 1730  BP: 134/83 133/75 123/82 123/82  Pulse: 78 83 82 81  Resp: 11 (!) 22 (!) 22 (!) 22  Temp:      TempSrc:      SpO2: 100% 100% 100% 100%  Weight:      Height:        Constitutional: NAD, calm, comfortable Vitals:   04/29/19 1530 04/29/19 1630 04/29/19 1700 04/29/19 1730  BP: 134/83 133/75 123/82 123/82  Pulse: 78 83 82 81  Resp: 11 (!) 22 (!) 22 (!) 22  Temp:      TempSrc:      SpO2: 100% 100% 100% 100%  Weight:      Height:       General appearance  -well-nourished well-developed gentleman in no acute distress.   Eyes: PERRL, lids and conjunctivae normal ENMT: Mucous membranes are moist. Posterior pharynx clear of any exudate or lesions.Normal dentition.  Neck: normal, supple, no masses, no thyromegaly Lymph nodes -no adenopathy in the cervical supraclavicular axillary or inguinal regions Respiratory: clear to auscultation bilaterally, no wheezing, no crackles. Normal respiratory effort. No accessory muscle use.  Cardiovascular: Regular rate and rhythm, no murmurs / rubs / gallops. No extremity edema. 2+ pedal pulses. No carotid bruits.  Abdomen: Bowel sounds positive in all 4 quadrants.  Ventral surgical scar well-healed.  He has easily palpable pulse in the area of the right renal artery.  Patient has tenderness over the right renal area and in the right upper quadrant.  It is point of tenderness is in the epigastrium with no guarding or rebound.  Musculoskeletal: no clubbing / cyanosis. No joint deformity upper and lower extremities. Good ROM, no contractures. Normal muscle tone.  Skin: no rashes, lesions, ulcers. No induration Neurologic: CN 2-12 grossly intact.  Diminished sensation to light touch both feet.,  Strength 5/5 in all 4.  Psychiatric: Normal judgment and insight. Alert and oriented x 3. Normal mood.     Labs on Admission: I have personally reviewed following labs and imaging studies  CBC: Recent Labs  Lab 04/29/19 1042  WBC 9.3  NEUTROABS 4.3  HGB 17.3*  HCT 51.4  MCV 101.2*  PLT 119   Basic Metabolic Panel: Recent Labs  Lab 04/29/19 1042 04/29/19 1412  NA 137 136  K 6.5* 6.0*  CL 110 110  CO2 15* 16*  GLUCOSE 127* 173*  BUN 53* 56*  CREATININE 5.03* 5.18*  CALCIUM 9.7 9.6  GFR: Estimated Creatinine Clearance: 13 mL/min (A) (by C-G formula based on SCr of 5.18 mg/dL (H)). Liver Function Tests: Recent Labs  Lab 04/29/19 1042 04/29/19 1412  AST 17 17  ALT 16 14  ALKPHOS 64 65  BILITOT 0.3 0.2*  PROT 8.5* 8.6*  ALBUMIN 4.5 4.5   Recent Labs  Lab 04/29/19 1042 04/29/19 1412  LIPASE 147* 145*   No results for input(s): AMMONIA in the last 168 hours. Coagulation Profile: No results for input(s): INR, PROTIME in the last 168 hours. Cardiac Enzymes: No results for input(s): CKTOTAL, CKMB, CKMBINDEX, TROPONINI in the last 168 hours. BNP (last 3 results) No results for input(s): PROBNP in the last 8760 hours. HbA1C: No results for input(s): HGBA1C in the last 72 hours. CBG: No results for input(s): GLUCAP in the last 168 hours. Lipid Profile: No results for input(s): CHOL, HDL, LDLCALC, TRIG, CHOLHDL, LDLDIRECT in the last 72 hours. Thyroid Function Tests: No results for input(s): TSH, T4TOTAL, FREET4, T3FREE, THYROIDAB in the last 72 hours. Anemia Panel: No results for input(s): VITAMINB12, FOLATE, FERRITIN, TIBC, IRON, RETICCTPCT in the last 72 hours. Urine analysis:     Component Value Date/Time   COLORURINE YELLOW 04/29/2019 1412   APPEARANCEUR CLEAR 04/29/2019 1412   LABSPEC 1.026 04/29/2019 1412   PHURINE 5.0 04/29/2019 1412   GLUCOSEU NEGATIVE 04/29/2019 1412   HGBUR SMALL (A) 04/29/2019 1412   BILIRUBINUR NEGATIVE 04/29/2019 1412   KETONESUR NEGATIVE 04/29/2019 1412   PROTEINUR 30 (A) 04/29/2019 1412   UROBILINOGEN 0.2 05/24/2013 2220   NITRITE NEGATIVE 04/29/2019 1412   LEUKOCYTESUR TRACE (A) 04/29/2019 1412    Radiological Exams on Admission: No results found.  EKG: Independently reviewed.  EKG reveals a normal sinus rhythm.  Question of old posterior infarct.  Assessment/Plan Active Problems:   AKI (acute kidney injury) ([place])   Acute hyperkalemia   Diabetes (HCC)   Abdominal pain   GERD (gastroesophageal reflux disease)  (please populate well all problems here in Problem List. (For example, if patient is on BP meds at home and you resume or decide to hold them, it is a problem that needs to be her. Same for CAD, COPD, HLD and so on)   1.  Acute kidney injury-patient with a sudden rise in creatinine to 5.  Last creatinine several years ago was 0.86.  FENa = 0.32 suggesting prerenal cause, possible dehydration.  He does relate a history consistent with BPH with nocturia x4+ and diminished force of stream.  He may have some ureteral compression from his prostate. Plan hydration with half-normal saline  Follow-up metabolic panel in the morning  May need nephrology consultation.  CT scan abdomen and pelvis to follow-up previously seen hydronephrosis on the right and any prostate disease  2.  Acute hyperkalemia -patient with a potassium of 6.5.  EKG without acute changes. Plan Hydration  Follow-up metabolic panel in the morning  3.  Abdominal pain -patient with a history of pancreatitis.  Also a history of GERD.  Patient denies nausea or vomiting.  He denies hematemesis hematochezia or melena. Plan continue PPI orally  Amylase and lipase  to rule out recurrent pancreatitis  CT of the abdomen to rule out recurrent pancreatitis  APAP or tramadol for pain  4.  Diabetes -patient reports he has been adherent to his medical regimen.  Last A1c on record in 2015 was 16.5%.  Patient does have a peripheral neuropathy most likely secondary to diabetes.  May have chronic kidney disease secondary to  diabetes. Plan hold Glucophage  Recheck A1c  Managed with sliding scale insulin while inpatient  DVT prophylaxis: Lovenox (Lovenox/Heparin/SCD's/anticoagulated/None (if comfort care) Code Status: Full code (Full/Partial (specify details) Family Communication: Discussed nature of his problem and the treatment plan with both the patient and his wife.  They agree with this plan.  They had no questions.  (Specify name, relationship. Do not write "discussed with patient". Specify tel # if discussed over the phone) Disposition Plan: Home in 2 to 3 days (specify when and where you expect patient to be discharged) Consults called: None (with names) Admission status: Inpatient (inpatient / obs / tele / medical floor / SDU)   Adella Hare MD [facility] Pager [phone]  If 7PM-7AM, please contact night-coverage [email or web address] Password [facility]  04/29/2019, 5:43 PM

## 2019-04-29 NOTE — ED Provider Notes (Signed)
[facility] EMERGENCY DEPARTMENT Provider Note   CSN: [id number] Arrival date & time: 04/29/19  1321    History   Chief Complaint Chief Complaint  Patient presents with  . Abnormal Lab    HPI Dylan Cline is a 68 y.o. male.     HPI  68 year old male sent from his PCP with abnormal labs.  Patient has history of pancreatitis, diabetes, hypertension and gastritis.  He reports that over the last 2 weeks he has been feeling unwell.  His p.o. intake has gone down.  He has been having some epigastric abdominal pain that is nonradiating.  He denies any nausea, vomiting, diarrhea, blood loss.  He denies any new medications, recent fevers or chills. He went to his PCP today and was advised to come to the ER after his kidney was found to be damage.  Past Medical History:  Diagnosis Date  . Acute type 2 diabetes mellitus with manifestations ([place])   . Hypertension   . Pancreatitis   . Pancreatitis, alcoholic, acute 62/0/3559    Patient Active Problem List   Diagnosis Date Noted  . Abdominal pain 04/29/2019  . Loss of weight 04/29/2019  . GERD (gastroesophageal reflux disease) 04/29/2019  . Tobacco use 04/09/2019  . Multiple lung nodules 10/09/2018  . Acute type 2 diabetes mellitus with manifestations ([place])   . Hyperosmolar (nonketotic) coma ([place]) 08/11/2014  . Diabetes ([place]) 08/10/2014  . Pancreatitis, alcoholic, acute 74/16/3845  . Alcoholism ([place]) 08/10/2014  . Uncontrolled diabetes mellitus ([place]) 08/10/2014    Past Surgical History:  Procedure Laterality Date  . COLONOSCOPY   01/27/2005   XMI:WOEHOZYY hemorrhoids, otherwise normal rectum/Normal colon, marginal prep made the exam more difficult  . ESOPHAGOGASTRODUODENOSCOPY   10/12/2004   QMG:NOIBBC esophagus/Focal area of submucosal petechial hemorrhage in the fundus (likely a trauma-induced phenomenon secondary to vomiting), otherwise normal  . LAPAROSCOPIC GASTROTOMY W/ REPAIR OF ULCER          Home Medications     Prior to Admission medications   Medication Sig Start Date End Date Taking? Authorizing Provider  atorvastatin (LIPITOR) 40 MG tablet Take 40 mg by mouth daily.   Yes [provider]  hydrochlorothiazide (HYDRODIURIL) 12.5 MG tablet TAKE 1 TABLET BY MOUTH ONCE DAILY FOR 90 DAYS 03/11/19  Yes [provider]  lisinopril (ZESTRIL) 40 MG tablet Take 40 mg by mouth daily. 03/10/19  Yes [provider]  metFORMIN (GLUCOPHAGE) 1000 MG tablet TAKE 2 TABLETS BY MOUTH ONCE DAILY WITH EVENING MEAL 03/10/19  Yes [provider]  omeprazole (PRILOSEC) 40 MG capsule Take 1 capsule (40 mg total) by mouth daily. 04/29/19  Yes Carlis Stable, NP    Family History Family History  Problem Relation Age of Onset  . Diabetes Mother   . Diabetes Father   . Heart attack Maternal Grandfather   . Cancer Paternal Grandmother   . Heart attack Maternal Uncle   . Cancer Maternal Aunt   . Cancer Maternal Uncle   . Colon cancer Neg Hx   . Pancreatic cancer Neg Hx   . Stomach cancer Neg Hx     Social History Social History   Tobacco Use  . Smoking status: Current Every Day Smoker    Packs/day: 1.00    Years: 40.00    Pack years: 40.00    Types: Cigarettes    Start date: 12/01/1970  . Smokeless tobacco: Never Used  Substance Use Topics  . Alcohol use: Yes    Alcohol/week: 0.0  standard drinks    Comment: wine daily one pint  . Drug use: No     Allergies   Patient has no known allergies.   Review of Systems Review of Systems  Constitutional: Positive for activity change.  Respiratory: Negative for shortness of breath.   Cardiovascular: Negative for chest pain.  Gastrointestinal: Negative for abdominal pain.  Genitourinary: Positive for decreased urine volume.  All other systems reviewed and are negative.    Physical Exam Updated Vital Signs BP 134/83   Pulse 78   Temp 98 F (36.7 C) (Oral)   Resp 11   Ht 5\' 8"  (1.727 m)   Wt 74.8 kg   SpO2 100%   BMI 25.09  kg/m   Physical Exam Vitals signs and nursing note reviewed.  Constitutional:      Appearance: He is well-developed.  HENT:     Head: Atraumatic.  Neck:     Musculoskeletal: Neck supple.  Cardiovascular:     Rate and Rhythm: Normal rate.  Pulmonary:     Effort: Pulmonary effort is normal.  Musculoskeletal:     Right lower leg: No edema.     Left lower leg: No edema.  Skin:    General: Skin is warm.  Neurological:     Mental Status: He is alert and oriented to person, place, and time.      ED Treatments / Results  Labs (all labs ordered are listed, but only abnormal results are displayed) Labs Reviewed  COMPREHENSIVE METABOLIC PANEL - Abnormal; Notable for the following components:      Result Value   Potassium 6.0 (*)    CO2 16 (*)    Glucose, Bld 173 (*)    BUN 56 (*)    Creatinine, Ser 5.18 (*)    Total Protein 8.6 (*)    Total Bilirubin 0.2 (*)    GFR calc non Af Amer 10 (*)    GFR calc Af Amer 12 (*)    All other components within normal limits  URINALYSIS, ROUTINE W REFLEX MICROSCOPIC - Abnormal; Notable for the following components:   Hgb urine dipstick SMALL (*)    Protein, ur 30 (*)    Leukocytes,Ua TRACE (*)    All other components within normal limits  LIPASE, BLOOD - Abnormal; Notable for the following components:   Lipase 145 (*)    All other components within normal limits  SARS CORONAVIRUS 2 (HOSPITAL ORDER, [facility])  SODIUM, URINE, RANDOM  CREATININE, URINE, RANDOM    EKG EKG Interpretation  Date/Time:  Tuesday April 29 2019 14:01:47 EDT Ventricular Rate:  92 PR Interval:    QRS Duration: 90 QT Interval:  322 QTC Calculation: 399 R Axis:     Text Interpretation:  Sinus rhythm Probable left atrial enlargement Posterior infarct, old Confirmed by Virgel Manifold [phone]) on 04/29/2019 3:36:12 PM   Radiology No results found.  Procedures .Critical Care Performed by: Varney Biles, MD Authorized by:  Varney Biles, MD   Critical care provider statement:    Critical care time (minutes):  36   Critical care was necessary to treat or prevent imminent or life-threatening deterioration of the following conditions:  Metabolic crisis   Critical care was time spent personally by me on the following activities:  Discussions with consultants, evaluation of patient's response to treatment, examination of patient, ordering and performing treatments and interventions, ordering and review of laboratory studies, ordering and review of radiographic studies, pulse oximetry, re-evaluation of patient's  condition, obtaining history from patient or surrogate and review of old charts   (including critical care time)  Medications Ordered in ED Medications  sodium zirconium cyclosilicate (LOKELMA) packet 10 g (has no administration in time range)  sodium bicarbonate injection 50 mEq (has no administration in time range)  insulin aspart (novoLOG) injection 5 Units (has no administration in time range)    And  dextrose 50 % solution 50 mL (has no administration in time range)  dextrose 10 % infusion (has no administration in time range)  sodium chloride 0.9 % bolus 1,000 mL (has no administration in time range)     Initial Impression / Assessment and Plan / ED Course  I have reviewed the triage vital signs and the nursing notes.  Pertinent labs & imaging results that were available during my care of the patient were reviewed by me and considered in my medical decision making (see chart for details).        68 year old comes with a chief complaint of abnormal labs.  He is noted to be in acute renal failure with moderately severe acute hyperkalemia. We will give him appropriate medications for his hyper K. FENA calculation shows that patient's etiology for AKI is likely prerenal.  He reports that he has history of gastritis and pancreatitis.  He is having epigastric abdominal discomfort that is  nonradiating.  Lipase is slightly elevated.  It is possible that he is having some pancreatitis as well.  On exam he does not have any peritoneal findings and therefore no CT scan will be ordered at this time.  Patient denies any history of cancer, heavy alcohol use, gallbladder problems. Dr. Wilson Singer to admit the patient.  Nephrology has not been consulted.  Final Clinical Impressions(s) / ED Diagnoses   Final diagnoses:  Hyperkalemia  AKI (acute kidney injury) [facility])    ED Discharge Orders    None       Varney Biles, MD 04/29/19 1546

## 2019-04-29 NOTE — Patient Instructions (Signed)
Your health issues we discussed today were:   Abdominal pain, weight loss, history of pancreatitis 1. Go to the lab now and have your labs drawn 2. Our office will help schedule your CT scan of your abdomen 3. As we discussed, if you have worsening symptoms such as more severe abdominal pain, nausea, vomiting, shortness of breath, chest pain, passing out, difficulty keeping down food and fluids then proceed to the emergency department  GERD (reflux/heartburn): 1. I have sent a prescription for Prilosec 40 mg to her pharmacy. 2. Take this once a day on an empty stomach 3. Call us if you have any worsening or severe symptoms.  Overall I recommend:  1. Continue your other current medications 2. Follow-up in 4 to 6 weeks 3. Call us if you have any questions or concerns.   Because of recent events of COVID-19 ("Coronavirus"), follow CDC recommendations:  1. Wash your hand frequently 2. Avoid touching your face 3. Stay away from people who are sick 4. If you have symptoms such as fever, cough, shortness of breath then call your healthcare provider for further guidance 5. If you are sick, STAY AT HOME unless otherwise directed by your healthcare provider. 6. Follow directions from state and national officials regarding staying safe   At [facility] we value your feedback. You may receive a survey about your visit today. Please share your experience as we strive to create trusting relationships with our patients to provide genuine, compassionate, quality care.  We appreciate your understanding and patience as we review any laboratory studies, imaging, and other diagnostic tests that are ordered as we care for you. Our office policy is 5 business days for review of these results, and any emergent or urgent results are addressed in a timely manner for your best interest. If you do not hear from our office in 1 week, please contact us.   We also encourage the use of MyChart,  which contains your medical information for your review as well. If you are not enrolled in this feature, an access code is on this after visit summary for your convenience. Thank you for allowing [place] to be involved in your care.  It was great to see you today!  I hope you have a great summer!!

## 2019-04-29 NOTE — ED Notes (Addendum)
Pt requesting only information to be given to spouse, Marlon Suleiman,   password is [employer]

## 2019-04-29 NOTE — Telephone Encounter (Signed)
PA for CT was approved via [employer]. Auth# [id number] Dates 04/29/2019-10/26/2019

## 2019-04-29 NOTE — ED Triage Notes (Signed)
Pt was seen today by Walden Field, NP for abdominal pain ongoing for 1 week and had blood work drawn. Pt was called and told to come to ED due to increased potassium level of 6.5, increased creatinine of 5 and lipase level of 145.

## 2019-04-29 NOTE — Telephone Encounter (Signed)
Potassium 6.5. Cr elevated above 5. Lipase 149. Needs to go to the ER for hyperkalemia, acute kidney failure, probable pancreatitis. I called and left the patient a VM. CT is aware as well and will redirect him if he shows up before calling back. I will give the ER a heads up.  FYI: AM to try and get in touch with the patient.

## 2019-04-30 ENCOUNTER — Inpatient Hospital Stay (HOSPITAL_COMMUNITY): Payer: Medicare HMO

## 2019-04-30 DIAGNOSIS — N179 Acute kidney failure, unspecified: Secondary | ICD-10-CM | POA: Diagnosis present

## 2019-04-30 DIAGNOSIS — T464X5A Adverse effect of angiotensin-converting-enzyme inhibitors, initial encounter: Secondary | ICD-10-CM | POA: Diagnosis present

## 2019-04-30 DIAGNOSIS — K219 Gastro-esophageal reflux disease without esophagitis: Secondary | ICD-10-CM

## 2019-04-30 DIAGNOSIS — R1010 Upper abdominal pain, unspecified: Secondary | ICD-10-CM | POA: Diagnosis not present

## 2019-04-30 DIAGNOSIS — D490 Neoplasm of unspecified behavior of digestive system: Secondary | ICD-10-CM | POA: Diagnosis present

## 2019-04-30 DIAGNOSIS — R8281 Pyuria: Secondary | ICD-10-CM | POA: Diagnosis present

## 2019-04-30 DIAGNOSIS — E785 Hyperlipidemia, unspecified: Secondary | ICD-10-CM | POA: Diagnosis present

## 2019-04-30 DIAGNOSIS — Z6825 Body mass index (BMI) 25.0-25.9, adult: Secondary | ICD-10-CM | POA: Diagnosis not present

## 2019-04-30 DIAGNOSIS — E875 Hyperkalemia: Secondary | ICD-10-CM | POA: Diagnosis present

## 2019-04-30 DIAGNOSIS — E1122 Type 2 diabetes mellitus with diabetic chronic kidney disease: Secondary | ICD-10-CM | POA: Diagnosis present

## 2019-04-30 DIAGNOSIS — F1020 Alcohol dependence, uncomplicated: Secondary | ICD-10-CM | POA: Diagnosis present

## 2019-04-30 DIAGNOSIS — J449 Chronic obstructive pulmonary disease, unspecified: Secondary | ICD-10-CM | POA: Diagnosis present

## 2019-04-30 DIAGNOSIS — E0822 Diabetes mellitus due to underlying condition with diabetic chronic kidney disease: Secondary | ICD-10-CM | POA: Diagnosis not present

## 2019-04-30 DIAGNOSIS — T39395A Adverse effect of other nonsteroidal anti-inflammatory drugs [NSAID], initial encounter: Secondary | ICD-10-CM | POA: Diagnosis present

## 2019-04-30 DIAGNOSIS — E869 Volume depletion, unspecified: Secondary | ICD-10-CM | POA: Diagnosis present

## 2019-04-30 DIAGNOSIS — R634 Abnormal weight loss: Secondary | ICD-10-CM | POA: Diagnosis present

## 2019-04-30 DIAGNOSIS — R1013 Epigastric pain: Secondary | ICD-10-CM | POA: Diagnosis present

## 2019-04-30 DIAGNOSIS — I2510 Atherosclerotic heart disease of native coronary artery without angina pectoris: Secondary | ICD-10-CM | POA: Diagnosis present

## 2019-04-30 DIAGNOSIS — N183 Chronic kidney disease, stage 3 (moderate): Secondary | ICD-10-CM | POA: Diagnosis present

## 2019-04-30 DIAGNOSIS — Z833 Family history of diabetes mellitus: Secondary | ICD-10-CM | POA: Diagnosis not present

## 2019-04-30 DIAGNOSIS — Z7984 Long term (current) use of oral hypoglycemic drugs: Secondary | ICD-10-CM | POA: Diagnosis not present

## 2019-04-30 DIAGNOSIS — I714 Abdominal aortic aneurysm, without rupture: Secondary | ICD-10-CM | POA: Diagnosis present

## 2019-04-30 DIAGNOSIS — E1142 Type 2 diabetes mellitus with diabetic polyneuropathy: Secondary | ICD-10-CM | POA: Diagnosis present

## 2019-04-30 DIAGNOSIS — F17210 Nicotine dependence, cigarettes, uncomplicated: Secondary | ICD-10-CM | POA: Diagnosis present

## 2019-04-30 DIAGNOSIS — I129 Hypertensive chronic kidney disease with stage 1 through stage 4 chronic kidney disease, or unspecified chronic kidney disease: Secondary | ICD-10-CM | POA: Diagnosis present

## 2019-04-30 DIAGNOSIS — Z20828 Contact with and (suspected) exposure to other viral communicable diseases: Secondary | ICD-10-CM | POA: Diagnosis present

## 2019-04-30 LAB — CBC
HCT: 44.6 % (ref 39.0–52.0)
Hemoglobin: 15.1 g/dL (ref 13.0–17.0)
MCH: 33.8 pg (ref 26.0–34.0)
MCHC: 33.9 g/dL (ref 30.0–36.0)
MCV: 99.8 fL (ref 80.0–100.0)
Platelets: 217 10*3/uL (ref 150–400)
RBC: 4.47 MIL/uL (ref 4.22–5.81)
RDW: 12.9 % (ref 11.5–15.5)
WBC: 8.5 10*3/uL (ref 4.0–10.5)
nRBC: 0 % (ref 0.0–0.2)

## 2019-04-30 LAB — BASIC METABOLIC PANEL
Anion gap: 7 (ref 5–15)
BUN: 54 mg/dL — ABNORMAL HIGH (ref 8–23)
CO2: 16 mmol/L — ABNORMAL LOW (ref 22–32)
Calcium: 8.8 mg/dL — ABNORMAL LOW (ref 8.9–10.3)
Chloride: 111 mmol/L (ref 98–111)
Creatinine, Ser: 4.52 mg/dL — ABNORMAL HIGH (ref 0.61–1.24)
GFR calc Af Amer: 14 mL/min — ABNORMAL LOW (ref >60)
GFR calc non Af Amer: 12 mL/min — ABNORMAL LOW (ref >60)
Glucose, Bld: 133 mg/dL — ABNORMAL HIGH (ref 70–99)
Potassium: 5.3 mmol/L — ABNORMAL HIGH (ref 3.5–5.1)
Sodium: 134 mmol/L — ABNORMAL LOW (ref 135–145)

## 2019-04-30 LAB — GLUCOSE, CAPILLARY
Glucose-Capillary: 99 mg/dL (ref 70–99)
Glucose-Capillary: 106 mg/dL — ABNORMAL HIGH (ref 70–99)
Glucose-Capillary: 146 mg/dL — ABNORMAL HIGH (ref 70–99)
Glucose-Capillary: 147 mg/dL — ABNORMAL HIGH (ref 70–99)
Glucose-Capillary: 141 mg/dL — ABNORMAL HIGH (ref 70–99)

## 2019-04-30 LAB — HEMOGLOBIN A1C
Hgb A1c MFr Bld: 7.3 % — ABNORMAL HIGH (ref 4.8–5.6)
Mean Plasma Glucose: 162.81 mg/dL

## 2019-04-30 MED ORDER — HYDROCORTISONE (PERIANAL) 2.5 % EX CREA
1.0000 "application " | TOPICAL_CREAM | Freq: Four times a day (QID) | CUTANEOUS | Status: DC | PRN
  Filled 2019-04-30: qty 28.35

## 2019-04-30 MED ORDER — SALINE SPRAY 0.65 % NA SOLN: 1.0000 | NASAL | Status: DC | PRN

## 2019-04-30 MED ORDER — ALUM & MAG HYDROXIDE-SIMETH 200-200-20 MG/5ML PO SUSP: 30.0000 mL | ORAL | Status: DC | PRN

## 2019-04-30 MED ORDER — SODIUM CHLORIDE 0.45 % IV SOLN
INTRAVENOUS | Status: DC
  Administered 2019-04-30 – 2019-05-03 (×7): via INTRAVENOUS

## 2019-04-30 MED ORDER — ZOLPIDEM TARTRATE 5 MG PO TABS
5.0000 mg | ORAL_TABLET | Freq: Every evening | ORAL | Status: DC | PRN
  Administered 2019-04-30 – 2019-05-04 (×5): 5 mg via ORAL
  Filled 2019-04-30 (×5): qty 1

## 2019-04-30 MED ORDER — HYDRALAZINE HCL 20 MG/ML IJ SOLN: 10.0000 mg | INTRAMUSCULAR | Status: DC | PRN

## 2019-04-30 MED ORDER — ACETAMINOPHEN 325 MG PO TABS
650.0000 mg | ORAL_TABLET | Freq: Four times a day (QID) | ORAL | Status: DC | PRN
  Administered 2019-04-30 – 2019-05-03 (×3): 650 mg via ORAL
  Filled 2019-04-30 (×3): qty 2

## 2019-04-30 MED ORDER — INSULIN ASPART 100 UNIT/ML ~~LOC~~ SOLN
0.0000 [IU] | Freq: Three times a day (TID) | SUBCUTANEOUS | Status: DC
  Administered 2019-04-30 – 2019-05-01 (×4): 1 [IU] via SUBCUTANEOUS
  Administered 2019-05-02 (×2): 2 [IU] via SUBCUTANEOUS
  Administered 2019-05-03: 1 [IU] via SUBCUTANEOUS
  Administered 2019-05-03 (×2): 2 [IU] via SUBCUTANEOUS
  Administered 2019-05-04: 1 [IU] via SUBCUTANEOUS
  Administered 2019-05-04: 3 [IU] via SUBCUTANEOUS
  Administered 2019-05-05 (×2): 1 [IU] via SUBCUTANEOUS

## 2019-04-30 MED ORDER — HYDROCODONE-ACETAMINOPHEN 5-325 MG PO TABS
1.0000 | ORAL_TABLET | ORAL | Status: AC | PRN
  Administered 2019-04-30 – 2019-05-01 (×3): 2 via ORAL
  Filled 2019-04-30 (×3): qty 2

## 2019-04-30 MED ORDER — POLYETHYLENE GLYCOL 3350 17 G PO PACK: 17.0000 g | PACK | Freq: Every day | ORAL | Status: DC | PRN

## 2019-04-30 MED ORDER — MUSCLE RUB 10-15 % EX CREA
1.0000 "application " | TOPICAL_CREAM | CUTANEOUS | Status: DC | PRN
  Filled 2019-04-30: qty 85

## 2019-04-30 MED ORDER — LIP MEDEX EX OINT
1.0000 "application " | TOPICAL_OINTMENT | CUTANEOUS | Status: DC | PRN
  Filled 2019-04-30: qty 7

## 2019-04-30 MED ORDER — SENNOSIDES-DOCUSATE SODIUM 8.6-50 MG PO TABS
2.0000 | ORAL_TABLET | Freq: Every evening | ORAL | Status: DC | PRN
  Filled 2019-04-30: qty 2

## 2019-04-30 MED ORDER — POLYVINYL ALCOHOL 1.4 % OP SOLN: 1.0000 [drp] | OPHTHALMIC | Status: DC | PRN

## 2019-04-30 MED ORDER — PHENOL 1.4 % MT LIQD: 1.0000 | OROMUCOSAL | Status: DC | PRN

## 2019-04-30 MED ORDER — LORATADINE 10 MG PO TABS: 10.0000 mg | ORAL_TABLET | Freq: Every day | ORAL | Status: DC | PRN

## 2019-04-30 MED ORDER — HYDROCORTISONE 1 % EX CREA
1.0000 "application " | TOPICAL_CREAM | Freq: Three times a day (TID) | CUTANEOUS | Status: DC | PRN
  Filled 2019-04-30: qty 28

## 2019-04-30 MED ORDER — ENOXAPARIN SODIUM 30 MG/0.3ML ~~LOC~~ SOLN
30.0000 mg | SUBCUTANEOUS | Status: DC
  Administered 2019-04-30 – 2019-05-05 (×5): 30 mg via SUBCUTANEOUS
  Filled 2019-04-30 (×7): qty 0.3

## 2019-04-30 MED ORDER — GUAIFENESIN-DM 100-10 MG/5ML PO SYRP: 5.0000 mL | ORAL_SOLUTION | ORAL | Status: DC | PRN

## 2019-04-30 NOTE — Progress Notes (Signed)
Initial Nutrition Assessment   RD working remotely.   DOCUMENTATION CODES:     INTERVENTION:  Renal diet   Snack qhs   NUTRITION DIAGNOSIS:   Inadequate oral intake related to poor appetite(abdominal pain/ AKI) as evidenced by per patient/family report, percent weight loss.   GOAL:  Patient will meet greater than or equal to 90% of their needs   MONITOR:   PO intake, Labs, Weight trends   REASON FOR ASSESSMENT:   Malnutrition Screening Tool   ASSESSMENT: Patient is a 68 yo male with history of pancreatitis, ETOH abuse (drinks pint of wine daily), hypertension and DM. He presents to ED with complaint of abdominal pain, acute Kidney injury and hypokalemia on admission. CT of abdomen / pelvis- no acute findings.  Meal intake: good this morning 75% of breakfast consumed. Talked with patient and his spouse via telephone. Prior to acute onset of symptoms patient usually consumes breakfast and dinner full meals- meat, veggie, starch and eggs, grits and toast at breakfast and a sandwich mid-day. Patient complains that he has lost his taste for food the past 1-2 weeks and had significantly decrease his intake.     Weight history shows weight between 75-77 kg this month. Overall a loss of ~7% or 5.6 kg. In January pt weighed 82.6 kg.  Suspect malnutrition but unable to confirm with nutrition focused exam today. Will f/u with pt tomorrow when RD is onsite.  Medications reviewed and include: SSI, Protonix, Lipitor   Labs: BMP Latest Ref Rng & Units 04/30/2019 04/29/2019 04/29/2019  Glucose 70 - 99 mg/dL 133(H) 173(H) 127(H)  BUN 8 - 23 mg/dL 54(H) 56(H) 53(H)  Creatinine 0.61 - 1.24 mg/dL 4.52(H) 5.18(H) 5.03(H)  Sodium 135 - 145 mmol/L 134(L) 136 137  Potassium 3.5 - 5.1 mmol/L 5.3(H) 6.0(H) 6.5(HH)  Chloride 98 - 111 mmol/L 111 110 110  CO2 22 - 32 mmol/L 16(L) 16(L) 15(L)  Calcium 8.9 - 10.3 mg/dL 8.8(L) 9.6 9.7     NUTRITION - FOCUSED PHYSICAL EXAM:  Unable to complete  Nutrition-Focused physical exam at this time.    Diet Order:   Diet Order            Diet renal with fluid restriction Fluid restriction: Other (see comments); Room service appropriate? Yes; Fluid consistency: Thin  Diet effective now              EDUCATION NEEDS:   No education needs have been identified at this time Skin:  Skin Assessment: Reviewed RN Assessment  Last BM:  7/28  Height:   Ht Readings from Last 1 Encounters:  04/30/19 5\' 9"  (1.753 m)    Weight:   Wt Readings from Last 1 Encounters:  04/30/19 77.4 kg    Ideal Body Weight:  73 kg  BMI:  Body mass index is 25.2 kg/m.  Estimated Nutritional Needs:   Kcal:  2156-2310  Protein:  54-62 gr  Fluid:  Renal diet has automatic 1200 ml fluid restriction unless otherwise liberalized per MD   Colman Cater MS,RD,CSG,LDN Office: [phone] Pager: [phone]

## 2019-04-30 NOTE — Progress Notes (Signed)
PROGRESS NOTE    Dylan Cline  UEA:[id number] DOB: [birth year] DOA: 04/29/2019 PCP: Augustine Radar, MD   Brief Narrative:  68 year old with history of non-insulin-dependent diabetes mellitus type 2, essential hypertension, alcohol use, GERD came to the hospital complains of some weight loss, poor appetite and nonspecific epigastric issues.  He was seen by outpatient GI in the labs showed creatinine of 5 therefore sent to the ER.  In the hospital he was noted to have creatinine of 5.3, potassium of 5.   Assessment & Plan:   Active Problems:   Diabetes (HCC)   Abdominal pain   GERD (gastroesophageal reflux disease)   AKI (acute kidney injury) (HCC)   Acute hyperkalemia   Hyperkalemia   Acute kidney injury, likely prerenal from dehydration -Baseline creatinine several years ago was 0.86, admission was 5.  Improved to 4.5 with IV fluids -We will continue IV fluids at this time. -Continue to provide supportive care, avoid nephrotoxic drugs -CT of the abdomen pelvis showed infrarenal 3.2 cm abdominal aortic aneurysm.  Otherwise no acute findings. -Renal ultrasound - Consult nephro if necessary.  Bicarb is down to 16.  Nonspecific abdominal pain, improved -Continue PPI. -CT of the abdomen pelvis is negative for acute pathology. - Lipase is very minimally elevated.  Does have history of chronic pancreatitis  Diabetes mellitus type 2 -Hemoglobin A1c 7.3.  Continues with sliding scale and Accu-Chek.  Sterile pyuria -No bacteria seen.  Continue to monitor symptoms.  Hyperlipidemia -Lipitor 40 mg daily  GERD -PPI daily  DVT prophylaxis: Lovenox Code Status: Full code Family Communication: None at bedside Disposition Plan: Maintain hospital stay for IV fluids until renal function is stabilized and improved  Consultants:   None  Procedures:   None  Antimicrobials:   None   Subjective: Feels okay except dry mouth.  Tells me his urine has been slightly dark.   Review of Systems Otherwise negative except as per HPI, including: General: Denies fever, chills, night sweats or unintended weight loss. Resp: Denies cough, wheezing, shortness of breath. Cardiac: Denies chest pain, palpitations, orthopnea, paroxysmal nocturnal dyspnea. GI: Denies abdominal pain, nausea, vomiting, diarrhea or constipation GU: Denies dysuria, frequency, hesitancy or incontinence MS: Denies muscle aches, joint pain or swelling Neuro: Denies headache, neurologic deficits (focal weakness, numbness, tingling), abnormal gait Psych: Denies anxiety, depression, SI/HI/AVH Skin: Denies new rashes or lesions ID: Denies sick contacts, exotic exposures, travel  Objective: Vitals:   04/30/19 0100 04/30/19 0143 04/30/19 0547 04/30/19 1500  BP: (!) 106/59 138/77 (!) 109/58 116/64  Pulse: 77 72 68 75  Resp: (!) 21 17 16 19   Temp:  97.8 F (36.6 C) 98.3 F (36.8 C) 97.9 F (36.6 C)  TempSrc:  Oral Oral Oral  SpO2: 99% 100% 99% 100%  Weight:  74.5 kg 77.4 kg   Height:  5\' 9"  (1.753 m)      Intake/Output Summary (Last 24 hours) at 04/30/2019 1553 Last data filed at 04/30/2019 0900 Gross per 24 hour  Intake 1424.84 ml  Output 1134 ml  Net 290.84 ml   Filed Weights   04/29/19 1351 04/30/19 0143 04/30/19 0547  Weight: 74.8 kg 74.5 kg 77.4 kg    Examination:  General exam: Appears calm and comfortable  Respiratory system: Clear to auscultation. Respiratory effort normal. Cardiovascular system: S1 & S2 heard, RRR. No JVD, murmurs, rubs, gallops or clicks. No pedal edema. Gastrointestinal system: Abdomen is nondistended, soft and nontender. No organomegaly or masses felt. Normal bowel sounds heard. Central nervous system:  Alert and oriented. No focal neurological deficits. Extremities: Symmetric 5 x 5 power. Skin: No rashes, lesions or ulcers Psychiatry: Judgement and insight appear normal. Mood & affect appropriate.     Data Reviewed:   CBC: Recent Labs  Lab 04/29/19  1042 04/30/19 0426  WBC 9.3 8.5  NEUTROABS 4.3  --   HGB 17.3* 15.1  HCT 51.4 44.6  MCV 101.2* 99.8  PLT 265 217   Basic Metabolic Panel: Recent Labs  Lab 04/29/19 1042 04/29/19 1412 04/30/19 0426  NA 137 136 134*  K 6.5* 6.0* 5.3*  CL 110 110 111  CO2 15* 16* 16*  GLUCOSE 127* 173* 133*  BUN 53* 56* 54*  CREATININE 5.03* 5.18* 4.52*  CALCIUM 9.7 9.6 8.8*   GFR: Estimated Creatinine Clearance: 15.4 mL/min (A) (by C-G formula based on SCr of 4.52 mg/dL (H)). Liver Function Tests: Recent Labs  Lab 04/29/19 1042 04/29/19 1412  AST 17 17  ALT 16 14  ALKPHOS 64 65  BILITOT 0.3 0.2*  PROT 8.5* 8.6*  ALBUMIN 4.5 4.5   Recent Labs  Lab 04/29/19 1042 04/29/19 1412  LIPASE 147* 145*   No results for input(s): AMMONIA in the last 168 hours. Coagulation Profile: No results for input(s): INR, PROTIME in the last 168 hours. Cardiac Enzymes: No results for input(s): CKTOTAL, CKMB, CKMBINDEX, TROPONINI in the last 168 hours. BNP (last 3 results) No results for input(s): PROBNP in the last 8760 hours. HbA1C: Recent Labs    04/29/19 1412  HGBA1C 7.3*   CBG: Recent Labs  Lab 04/30/19 0146 04/30/19 0756 04/30/19 1140  GLUCAP 99 106* 146*   Lipid Profile: No results for input(s): CHOL, HDL, LDLCALC, TRIG, CHOLHDL, LDLDIRECT in the last 72 hours. Thyroid Function Tests: No results for input(s): TSH, T4TOTAL, FREET4, T3FREE, THYROIDAB in the last 72 hours. Anemia Panel: No results for input(s): VITAMINB12, FOLATE, FERRITIN, TIBC, IRON, RETICCTPCT in the last 72 hours. Sepsis Labs: No results for input(s): PROCALCITON, LATICACIDVEN in the last 168 hours.  Recent Results (from the past 240 hour(s))  SARS Coronavirus 2 (CEPHEID - Performed in [facility] hospital lab), Hosp Order     Status: None   Collection Time: 04/29/19  2:13 PM   Specimen: Nasopharyngeal Swab  Result Value Ref Range Status   SARS Coronavirus 2 NEGATIVE NEGATIVE Final    Comment: (NOTE) If  result is NEGATIVE SARS-CoV-2 target nucleic acids are NOT DETECTED. The SARS-CoV-2 RNA is generally detectable in upper and lower  respiratory specimens during the acute phase of infection. The lowest  concentration of SARS-CoV-2 viral copies this assay can detect is 250  copies / mL. A negative result does not preclude SARS-CoV-2 infection  and should not be used as the sole basis for treatment or other  patient management decisions.  A negative result may occur with  improper specimen collection / handling, submission of specimen other  than nasopharyngeal swab, presence of viral mutation(s) within the  areas targeted by this assay, and inadequate number of viral copies  (<250 copies / mL). A negative result must be combined with clinical  observations, patient history, and epidemiological information. If result is POSITIVE SARS-CoV-2 target nucleic acids are DETECTED. The SARS-CoV-2 RNA is generally detectable in upper and lower  respiratory specimens dur ing the acute phase of infection.  Positive  results are indicative of active infection with SARS-CoV-2.  Clinical  correlation with patient history and other diagnostic information is  necessary to determine patient infection status.  Positive results do  not rule out bacterial infection or co-infection with other viruses. If result is PRESUMPTIVE POSTIVE SARS-CoV-2 nucleic acids MAY BE PRESENT.   A presumptive positive result was obtained on the submitted specimen  and confirmed on repeat testing.  While 2019 novel coronavirus  (SARS-CoV-2) nucleic acids may be present in the submitted sample  additional confirmatory testing may be necessary for epidemiological  and / or clinical management purposes  to differentiate between  SARS-CoV-2 and other Sarbecovirus currently known to infect humans.  If clinically indicated additional testing with an alternate test  methodology [phone]) is advised. The SARS-CoV-2 RNA is generally   detectable in upper and lower respiratory sp ecimens during the acute  phase of infection. The expected result is Negative. Fact Sheet for Patients:  BoilerBrush.com.cy Fact Sheet for Healthcare Providers: https://pope.com/ This test is not yet approved or cleared by the [place] FDA and has been authorized for detection and/or diagnosis of SARS-CoV-2 by FDA under an Emergency Use Authorization (EUA).  This EUA will remain in effect (meaning this test can be used) for the duration of the COVID-19 declaration under Section 564(b)(1) of the Act, 21 U.S.C. section 360bbb-3(b)(1), unless the authorization is terminated or revoked sooner. Performed at [facility], [address] [place], [place] [address]          Radiology Studies: Ct Abdomen Pelvis Wo Contrast  Result Date: 04/29/2019 CLINICAL DATA:  sent from his PCP with abnormal labs. Patient has history of pancreatitis, diabetes, hypertension and gastritis. He reports that over the last 2 weeks he has been feeling unwell. His p.o. intake has gone down. He has been having some epigastric abdominal pain that is nonradiating. He denies any nausea, vomiting, diarrhea, blood loss. He denies any new medications, recent fevers or chills.He went to his PCP today and was advised to come to the ER after his kidney was found to be damage. EXAM: CT ABDOMEN AND PELVIS WITHOUT CONTRAST TECHNIQUE: Multidetector CT imaging of the abdomen and pelvis was performed following the standard protocol without IV contrast. COMPARISON:  05/24/2013 FINDINGS: Lower chest: No acute findings. Hepatobiliary: No focal liver abnormality is seen. No gallstones, gallbladder wall thickening, or biliary dilatation. Pancreas: Unremarkable. No pancreatic ductal dilatation or surrounding inflammatory changes. Spleen: Normal in size without focal abnormality. Adrenals/Urinary Tract: No adrenal masses. Mild right renal cortical  thinning. Kidneys normal in position and orientation. No renal masses or stones. No hydronephrosis. Ureters are normal in course and in caliber.  No ureteral stones. Bladder is unremarkable. Stomach/Bowel: Stomach is unremarkable. Small bowel and colon are normal in caliber. No wall thickening or inflammation. There are scattered colonic diverticula without diverticulitis. What appears to be the appendix lies in the right upper quadrant. It is dilated, to 1.1 cm, but has a thin wall and contains air and a small amount of contrast. This is also stable from the prior CT. Vascular/Lymphatic: Diffuse aortic ectasia atherosclerosis. Aorta measures 3.3 cm anterior-posterior at its hiatus. The infrarenal aorta measures 3.2 cm anterior-posterior. There is some ill-defined soft tissue surrounding the wall of the infrarenal abdominal aorta extending to the proximal iliac arteries. This soft tissue is new from the prior CT and the infrarenal aorta has increased in diameter 2.6 cm in this location on the prior CT. No enlarged lymph nodes. Reproductive: Prominent prostate measuring 4.8 x 3.8 cm transversely. Other: Small adjacent fat contained midline anterior abdominal wall hernias. No bowel enters these. These are also stable from the prior CT.  No ascites. Musculoskeletal: No fracture or acute finding. No osteoblastic or osteolytic lesions. IMPRESSION: 1. No acute findings. 2. Abdominal aortic aneurysm, increased in size from the prior CT, 3.2 cm is infrarenal portion. There is soft tissue attenuation surrounding the infrarenal abdominal aortic aneurysm extending to the proximal portion of the common iliac arteries. This suggest fibrosis. Consider follow-up contrast enhanced CT angiography to assess aorta and its branch vessels, if the patient can tolerate iodinated contrast. Electronically Signed   By: Amie Portland M.D.   On: 04/29/2019 19:02        Scheduled Meds: . atorvastatin  40 mg Oral Daily  . enoxaparin  (LOVENOX) injection  30 mg Subcutaneous Q24H  . insulin aspart  0-9 Units Subcutaneous TID WC  . pantoprazole  40 mg Oral Daily   Continuous Infusions: . sodium chloride 75 mL/hr at 04/30/19 0208     LOS: 0 days   Time spent= 35 mins    Khori Rosevear Joline Maxcy, MD [facility]  If 7PM-7AM, please contact night-coverage [email or web address] 04/30/2019, 3:53 PM

## 2019-04-30 NOTE — Assessment & Plan Note (Signed)
History of acute alcoholic pancreatitis, alcoholism, still drinking "a pint a day."  He is having acute onset abdominal pain around the periumbilical region.  PPI has not helped his symptoms.  Pain is quite significant at an 8 on a scale of 0-10.  His pain is constant.  Also noted 35 pound weight loss.  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  Given his significant and acute onset symptoms I am concerned he may be developing acute pancreatitis.  I will check stat labs including CBC, CMP, lipase.  I will also order a stat CT of the abdomen and pelvis to further evaluate.  Follow-up in 4 to 6 weeks.

## 2019-04-30 NOTE — Plan of Care (Signed)

## 2019-04-30 NOTE — Assessment & Plan Note (Signed)
As an aside, he is having some worsening GERD symptoms as well.  He is taking omeprazole 20 mg which is not helping much.  I will have him increase this to 40 mg daily.  Follow-up in 4 to 6 weeks.

## 2019-04-30 NOTE — Progress Notes (Signed)
Received critical lab results and abnormal labs including elevated lipase at 147.  Critical potassium is 6.5.  Elevated creatinine at 5 (previously 0.8).  CT called around the same time to indicate they could not do a contrast CT study.  I have asked him to prefer the patient to the emergency department if he arrives for CT scan and it can be done through the ER without contrast.  I called his phone and left a message to call us back and requested staff informed him to proceed immediately to the emergency department.  Unfortunately, in addition to possible pancreatitis appears he has acute kidney failure resulting in dangerously high potassium.  He will likely need to be admitted for further treatment and evaluation.

## 2019-05-01 LAB — CBC
HCT: 47.3 % (ref 39.0–52.0)
Hemoglobin: 15.6 g/dL (ref 13.0–17.0)
MCH: 33.5 pg (ref 26.0–34.0)
MCHC: 33 g/dL (ref 30.0–36.0)
MCV: 101.7 fL — ABNORMAL HIGH (ref 80.0–100.0)
Platelets: 228 10*3/uL (ref 150–400)
RBC: 4.65 MIL/uL (ref 4.22–5.81)
RDW: 12.7 % (ref 11.5–15.5)
WBC: 7.8 10*3/uL (ref 4.0–10.5)
nRBC: 0 % (ref 0.0–0.2)

## 2019-05-01 LAB — GLUCOSE, CAPILLARY
Glucose-Capillary: 131 mg/dL — ABNORMAL HIGH (ref 70–99)
Glucose-Capillary: 147 mg/dL — ABNORMAL HIGH (ref 70–99)
Glucose-Capillary: 104 mg/dL — ABNORMAL HIGH (ref 70–99)
Glucose-Capillary: 159 mg/dL — ABNORMAL HIGH (ref 70–99)

## 2019-05-01 LAB — BASIC METABOLIC PANEL
Anion gap: 8 (ref 5–15)
BUN: 48 mg/dL — ABNORMAL HIGH (ref 8–23)
CO2: 18 mmol/L — ABNORMAL LOW (ref 22–32)
Calcium: 9.2 mg/dL (ref 8.9–10.3)
Chloride: 110 mmol/L (ref 98–111)
Creatinine, Ser: 3.52 mg/dL — ABNORMAL HIGH (ref 0.61–1.24)
GFR calc Af Amer: 19 mL/min — ABNORMAL LOW (ref >60)
GFR calc non Af Amer: 17 mL/min — ABNORMAL LOW (ref >60)
Glucose, Bld: 124 mg/dL — ABNORMAL HIGH (ref 70–99)
Potassium: 4.9 mmol/L (ref 3.5–5.1)
Sodium: 136 mmol/L (ref 135–145)

## 2019-05-01 LAB — MAGNESIUM: Magnesium: 2 mg/dL (ref 1.7–2.4)

## 2019-05-01 MED ORDER — PANTOPRAZOLE SODIUM 40 MG PO TBEC
40.0000 mg | DELAYED_RELEASE_TABLET | Freq: Two times a day (BID) | ORAL | Status: DC
  Administered 2019-05-01 – 2019-05-05 (×8): 40 mg via ORAL
  Filled 2019-05-01 (×9): qty 1

## 2019-05-01 NOTE — Progress Notes (Signed)
PROGRESS NOTE    Dylan Cline  VWU:[id number] DOB: [birth year] DOA: 04/29/2019 PCP: Augustine Radar, MD   Brief Narrative:  68 year old with history of non-insulin-dependent diabetes mellitus type 2, essential hypertension, alcohol use, GERD came to the hospital complains of some weight loss, poor appetite and nonspecific epigastric issues.  He was seen by outpatient GI in the labs showed creatinine of 5 therefore sent to the ER.  In the hospital he was noted to have creatinine of 5.3, potassium of 5.   Assessment & Plan:   Active Problems:   Diabetes (HCC)   Abdominal pain   GERD (gastroesophageal reflux disease)   AKI (acute kidney injury) (HCC)   Acute hyperkalemia   Hyperkalemia   Acute kidney injury, likely prerenal from dehydration -Baseline creatinine several years ago was 0.86, admission was 5.  Improving with IV fluids.  Today's 3.5. -CT of the abdomen pelvis showed infrarenal 3.2 cm abdominal aortic aneurysm.  Otherwise no acute findings. -Renal ultrasound - Consult nephro if necessary.  Bicarb is 18  Nonspecific abdominal pain, improved -Continue PPI-increase to twice daily before meals.  Will get GI recommendations if necessary.  The abdomen pelvis is negative. -CT of the abdomen pelvis is negative for acute pathology. - Lipase is very minimally elevated.  Does have history of chronic pancreatitis  Diabetes mellitus type 2 -Hemoglobin A1c 7.3.  Continues with sliding scale and Accu-Chek.  Sterile pyuria -No bacteria seen.  Continue to monitor symptoms.  Hyperlipidemia -Lipitor 40 mg daily  GERD -PPI daily  DVT prophylaxis: Lovenox Code Status: Full code Family Communication: None at bedside Disposition Plan: Maintain hospital stay for IV fluids until renal function is stabilized and improved  Consultants:   None  Procedures:   None  Antimicrobials:   None   Subjective: Feels a little better.  Making urine.  Tells me that he is having still  abdominal slight discomfort and is concerned about possible ulcer as he has had this in the past.  Review of Systems Otherwise negative except as per HPI, including: General = no fevers, chills, dizziness, malaise, fatigue HEENT/EYES = negative for pain, redness, loss of vision, double vision, blurred vision, loss of hearing, sore throat, hoarseness, dysphagia Cardiovascular= negative for chest pain, palpitation, murmurs, lower extremity swelling Respiratory/lungs= negative for shortness of breath, cough, hemoptysis, wheezing, mucus production Gastrointestinal= negative formelena, hematemesis Genitourinary= negative for Dysuria, Hematuria, Change in Urinary Frequency MSK = Negative for arthralgia, myalgias, Back Pain, Joint swelling  Neurology= Negative for headache, seizures, numbness, tingling  Psychiatry= Negative for anxiety, depression, suicidal and homocidal ideation Allergy/Immunology= Medication/Food allergy as listed  Skin= Negative for Rash, lesions, ulcers, itching   Objective: Vitals:   04/30/19 1500 04/30/19 1953 04/30/19 2127 05/01/19 0514  BP: 116/64  135/72 137/85  Pulse: 75  72 65  Resp: 19  17 17   Temp: 97.9 F (36.6 C)  98.2 F (36.8 C) (!) 97.5 F (36.4 C)  TempSrc: Oral  Oral Oral  SpO2: 100% 98% 100% 99%  Weight:      Height:        Intake/Output Summary (Last 24 hours) at 05/01/2019 1144 Last data filed at 05/01/2019 0840 Gross per 24 hour  Intake 600 ml  Output 1900 ml  Net -1300 ml   Filed Weights   04/29/19 1351 04/30/19 0143 04/30/19 0547  Weight: 74.8 kg 74.5 kg 77.4 kg    Examination:  Constitutional: NAD, calm, comfortable Eyes: PERRL, lids and conjunctivae normal ENMT: Mucous membranes are moist.  Posterior pharynx clear of any exudate or lesions.Normal dentition.  Neck: normal, supple, no masses, no thyromegaly Respiratory: clear to auscultation bilaterally, no wheezing, no crackles. Normal respiratory effort. No accessory muscle use.    Cardiovascular: Regular rate and rhythm, no murmurs / rubs / gallops. No extremity edema. 2+ pedal pulses. No carotid bruits.  Abdomen: no tenderness, no masses palpated. No hepatosplenomegaly. Bowel sounds positive.  Musculoskeletal: no clubbing / cyanosis. No joint deformity upper and lower extremities. Good ROM, no contractures. Normal muscle tone.  Skin: no rashes, lesions, ulcers. No induration Neurologic: CN 2-12 grossly intact. Sensation intact, DTR normal. Strength 5/5 in all 4.  Psychiatric: Normal judgment and insight. Alert and oriented x 3. Normal mood.    Data Reviewed:   CBC: Recent Labs  Lab 04/29/19 1042 04/30/19 0426 05/01/19 0546  WBC 9.3 8.5 7.8  NEUTROABS 4.3  --   --   HGB 17.3* 15.1 15.6  HCT 51.4 44.6 47.3  MCV 101.2* 99.8 101.7*  PLT 265 217 228   Basic Metabolic Panel: Recent Labs  Lab 04/29/19 1042 04/29/19 1412 04/30/19 0426 05/01/19 0546  NA 137 136 134* 136  K 6.5* 6.0* 5.3* 4.9  CL 110 110 111 110  CO2 15* 16* 16* 18*  GLUCOSE 127* 173* 133* 124*  BUN 53* 56* 54* 48*  CREATININE 5.03* 5.18* 4.52* 3.52*  CALCIUM 9.7 9.6 8.8* 9.2  MG  --   --   --  2.0   GFR: Estimated Creatinine Clearance: 19.8 mL/min (A) (by C-G formula based on SCr of 3.52 mg/dL (H)). Liver Function Tests: Recent Labs  Lab 04/29/19 1042 04/29/19 1412  AST 17 17  ALT 16 14  ALKPHOS 64 65  BILITOT 0.3 0.2*  PROT 8.5* 8.6*  ALBUMIN 4.5 4.5   Recent Labs  Lab 04/29/19 1042 04/29/19 1412  LIPASE 147* 145*   No results for input(s): AMMONIA in the last 168 hours. Coagulation Profile: No results for input(s): INR, PROTIME in the last 168 hours. Cardiac Enzymes: No results for input(s): CKTOTAL, CKMB, CKMBINDEX, TROPONINI in the last 168 hours. BNP (last 3 results) No results for input(s): PROBNP in the last 8760 hours. HbA1C: Recent Labs    04/29/19 1412  HGBA1C 7.3*   CBG: Recent Labs  Lab 04/30/19 1140 04/30/19 1632 04/30/19 2129 05/01/19 0758  05/01/19 1118  GLUCAP 146* 147* 141* 131* 147*   Lipid Profile: No results for input(s): CHOL, HDL, LDLCALC, TRIG, CHOLHDL, LDLDIRECT in the last 72 hours. Thyroid Function Tests: No results for input(s): TSH, T4TOTAL, FREET4, T3FREE, THYROIDAB in the last 72 hours. Anemia Panel: No results for input(s): VITAMINB12, FOLATE, FERRITIN, TIBC, IRON, RETICCTPCT in the last 72 hours. Sepsis Labs: No results for input(s): PROCALCITON, LATICACIDVEN in the last 168 hours.  Recent Results (from the past 240 hour(s))  SARS Coronavirus 2 (CEPHEID - Performed in [facility] hospital lab), Hosp Order     Status: None   Collection Time: 04/29/19  2:13 PM   Specimen: Nasopharyngeal Swab  Result Value Ref Range Status   SARS Coronavirus 2 NEGATIVE NEGATIVE Final    Comment: (NOTE) If result is NEGATIVE SARS-CoV-2 target nucleic acids are NOT DETECTED. The SARS-CoV-2 RNA is generally detectable in upper and lower  respiratory specimens during the acute phase of infection. The lowest  concentration of SARS-CoV-2 viral copies this assay can detect is 250  copies / mL. A negative result does not preclude SARS-CoV-2 infection  and should not be used as the  sole basis for treatment or other  patient management decisions.  A negative result may occur with  improper specimen collection / handling, submission of specimen other  than nasopharyngeal swab, presence of viral mutation(s) within the  areas targeted by this assay, and inadequate number of viral copies  (<250 copies / mL). A negative result must be combined with clinical  observations, patient history, and epidemiological information. If result is POSITIVE SARS-CoV-2 target nucleic acids are DETECTED. The SARS-CoV-2 RNA is generally detectable in upper and lower  respiratory specimens dur ing the acute phase of infection.  Positive  results are indicative of active infection with SARS-CoV-2.  Clinical  correlation with patient history and other  diagnostic information is  necessary to determine patient infection status.  Positive results do  not rule out bacterial infection or co-infection with other viruses. If result is PRESUMPTIVE POSTIVE SARS-CoV-2 nucleic acids MAY BE PRESENT.   A presumptive positive result was obtained on the submitted specimen  and confirmed on repeat testing.  While 2019 novel coronavirus  (SARS-CoV-2) nucleic acids may be present in the submitted sample  additional confirmatory testing may be necessary for epidemiological  and / or clinical management purposes  to differentiate between  SARS-CoV-2 and other Sarbecovirus currently known to infect humans.  If clinically indicated additional testing with an alternate test  methodology [phone]) is advised. The SARS-CoV-2 RNA is generally  detectable in upper and lower respiratory sp ecimens during the acute  phase of infection. The expected result is Negative. Fact Sheet for Patients:  BoilerBrush.com.cy Fact Sheet for Healthcare Providers: https://pope.com/ This test is not yet approved or cleared by the [place] FDA and has been authorized for detection and/or diagnosis of SARS-CoV-2 by FDA under an Emergency Use Authorization (EUA).  This EUA will remain in effect (meaning this test can be used) for the duration of the COVID-19 declaration under Section 564(b)(1) of the Act, 21 U.S.C. section 360bbb-3(b)(1), unless the authorization is terminated or revoked sooner. Performed at [facility], [address] [place], [place] [address]          Radiology Studies: Ct Abdomen Pelvis Wo Contrast  Result Date: 04/29/2019 CLINICAL DATA:  sent from his PCP with abnormal labs. Patient has history of pancreatitis, diabetes, hypertension and gastritis. He reports that over the last 2 weeks he has been feeling unwell. His p.o. intake has gone down. He has been having some epigastric abdominal pain that is  nonradiating. He denies any nausea, vomiting, diarrhea, blood loss. He denies any new medications, recent fevers or chills.He went to his PCP today and was advised to come to the ER after his kidney was found to be damage. EXAM: CT ABDOMEN AND PELVIS WITHOUT CONTRAST TECHNIQUE: Multidetector CT imaging of the abdomen and pelvis was performed following the standard protocol without IV contrast. COMPARISON:  05/24/2013 FINDINGS: Lower chest: No acute findings. Hepatobiliary: No focal liver abnormality is seen. No gallstones, gallbladder wall thickening, or biliary dilatation. Pancreas: Unremarkable. No pancreatic ductal dilatation or surrounding inflammatory changes. Spleen: Normal in size without focal abnormality. Adrenals/Urinary Tract: No adrenal masses. Mild right renal cortical thinning. Kidneys normal in position and orientation. No renal masses or stones. No hydronephrosis. Ureters are normal in course and in caliber.  No ureteral stones. Bladder is unremarkable. Stomach/Bowel: Stomach is unremarkable. Small bowel and colon are normal in caliber. No wall thickening or inflammation. There are scattered colonic diverticula without diverticulitis. What appears to be the appendix lies in the right upper  quadrant. It is dilated, to 1.1 cm, but has a thin wall and contains air and a small amount of contrast. This is also stable from the prior CT. Vascular/Lymphatic: Diffuse aortic ectasia atherosclerosis. Aorta measures 3.3 cm anterior-posterior at its hiatus. The infrarenal aorta measures 3.2 cm anterior-posterior. There is some ill-defined soft tissue surrounding the wall of the infrarenal abdominal aorta extending to the proximal iliac arteries. This soft tissue is new from the prior CT and the infrarenal aorta has increased in diameter 2.6 cm in this location on the prior CT. No enlarged lymph nodes. Reproductive: Prominent prostate measuring 4.8 x 3.8 cm transversely. Other: Small adjacent fat contained midline  anterior abdominal wall hernias. No bowel enters these. These are also stable from the prior CT. No ascites. Musculoskeletal: No fracture or acute finding. No osteoblastic or osteolytic lesions. IMPRESSION: 1. No acute findings. 2. Abdominal aortic aneurysm, increased in size from the prior CT, 3.2 cm is infrarenal portion. There is soft tissue attenuation surrounding the infrarenal abdominal aortic aneurysm extending to the proximal portion of the common iliac arteries. This suggest fibrosis. Consider follow-up contrast enhanced CT angiography to assess aorta and its branch vessels, if the patient can tolerate iodinated contrast. Electronically Signed   By: Amie Portland M.D.   On: 04/29/2019 19:02   US Renal  Result Date: 04/30/2019 CLINICAL DATA:  Acute kidney injury.  Worsening renal function. EXAM: RENAL / URINARY TRACT ULTRASOUND COMPLETE COMPARISON:  CT 04/29/2019 FINDINGS: Right Kidney: Renal measurements: 9.8 x 4.5 x 4.1 cm = volume: 94 mL. Normal echogenicity. Mild fullness of the renal collecting system. Left Kidney: Renal measurements: 11.1 x 5.4 x 5.2 cm = volume: 162 mL. Normal echogenicity. No hydronephrosis. Bladder: Appears normal for degree of bladder distention. IMPRESSION: Right kidney is smaller than the left and shows mild fullness of the renal collecting system without frank hydronephrosis. Left kidney appears sonographically normal. No bladder abnormality seen. Electronically Signed   By: Paulina Fusi M.D.   On: 04/30/2019 19:59        Scheduled Meds:  atorvastatin  40 mg Oral Daily   enoxaparin (LOVENOX) injection  30 mg Subcutaneous Q24H   insulin aspart  0-9 Units Subcutaneous TID WC   pantoprazole  40 mg Oral BID AC   Continuous Infusions:  sodium chloride 75 mL/hr at 05/01/19 0333     LOS: 1 day   Time spent= 25 mins    Mistina Coatney Joline Maxcy, MD [facility]  If 7PM-7AM, please contact night-coverage [email or web address] 05/01/2019, 11:44 AM

## 2019-05-02 LAB — CBC
HCT: 43.6 % (ref 39.0–52.0)
Hemoglobin: 14.8 g/dL (ref 13.0–17.0)
MCH: 33.7 pg (ref 26.0–34.0)
MCHC: 33.9 g/dL (ref 30.0–36.0)
MCV: 99.3 fL (ref 80.0–100.0)
Platelets: 227 10*3/uL (ref 150–400)
RBC: 4.39 MIL/uL (ref 4.22–5.81)
RDW: 12.8 % (ref 11.5–15.5)
WBC: 6.9 10*3/uL (ref 4.0–10.5)
nRBC: 0 % (ref 0.0–0.2)

## 2019-05-02 LAB — GLUCOSE, CAPILLARY
Glucose-Capillary: 174 mg/dL — ABNORMAL HIGH (ref 70–99)
Glucose-Capillary: 169 mg/dL — ABNORMAL HIGH (ref 70–99)
Glucose-Capillary: 89 mg/dL (ref 70–99)
Glucose-Capillary: 203 mg/dL — ABNORMAL HIGH (ref 70–99)

## 2019-05-02 LAB — BASIC METABOLIC PANEL
Anion gap: 7 (ref 5–15)
BUN: 41 mg/dL — ABNORMAL HIGH (ref 8–23)
CO2: 17 mmol/L — ABNORMAL LOW (ref 22–32)
Calcium: 9 mg/dL (ref 8.9–10.3)
Chloride: 111 mmol/L (ref 98–111)
Creatinine, Ser: 3.05 mg/dL — ABNORMAL HIGH (ref 0.61–1.24)
GFR calc Af Amer: 23 mL/min — ABNORMAL LOW (ref >60)
GFR calc non Af Amer: 20 mL/min — ABNORMAL LOW (ref >60)
Glucose, Bld: 138 mg/dL — ABNORMAL HIGH (ref 70–99)
Potassium: 4.9 mmol/L (ref 3.5–5.1)
Sodium: 135 mmol/L (ref 135–145)

## 2019-05-02 LAB — MAGNESIUM: Magnesium: 2 mg/dL (ref 1.7–2.4)

## 2019-05-02 MED ORDER — ALUM & MAG HYDROXIDE-SIMETH 200-200-20 MG/5ML PO SUSP
30.0000 mL | Freq: Once | ORAL | Status: AC
  Administered 2019-05-02: 30 mL via ORAL
  Filled 2019-05-02: qty 30

## 2019-05-02 MED ORDER — DICYCLOMINE HCL 10 MG/5ML PO SOLN
10.0000 mg | Freq: Once | ORAL | Status: DC
  Filled 2019-05-02: qty 5

## 2019-05-02 MED ORDER — DICYCLOMINE HCL 10 MG PO CAPS
10.0000 mg | ORAL_CAPSULE | Freq: Once | ORAL | Status: AC
  Administered 2019-05-02: 10 mg via ORAL
  Filled 2019-05-02: qty 1

## 2019-05-02 MED ORDER — LIDOCAINE VISCOUS HCL 2 % MT SOLN
15.0000 mL | Freq: Once | OROMUCOSAL | Status: AC
  Administered 2019-05-02: 02:00:00 15 mL via ORAL
  Filled 2019-05-02: qty 15

## 2019-05-02 NOTE — Progress Notes (Signed)
Telemetry reported HR 140, ST. Assessed pt. Up ad lib and changing gown. No c/o pain or discomfort. HR 76 and reg at present time.

## 2019-05-02 NOTE — Progress Notes (Signed)
PROGRESS NOTE    Dylan Cline  QIO:[id number] DOB: 1951/07/26 DOA: 04/29/2019 PCP: Augustine Radar, MD   Brief Narrative:  68 year old with history of non-insulin-dependent diabetes mellitus type 2, essential hypertension, alcohol use, GERD came to the hospital complains of some weight loss, poor appetite and nonspecific epigastric issues.  He was seen by outpatient GI in the labs showed creatinine of 5 therefore sent to the ER.  In the hospital he was noted to have creatinine of 5.3, potassium of 5.  Renal function seems to be improving with IV fluids.   Assessment & Plan:   Active Problems:   Diabetes (HCC)   Abdominal pain   GERD (gastroesophageal reflux disease)   AKI (acute kidney injury) (HCC)   Acute hyperkalemia   Hyperkalemia   Acute kidney injury, likely prerenal from dehydration -Baseline creatinine several years ago was 0.86, admission was 5.  Improving with IV fluids-increase rate to 100 cc/h today.  Today's 3.05 -CT of the abdomen pelvis showed infrarenal 3.2 cm abdominal aortic aneurysm.  Otherwise no acute findings. -Renal ultrasound-negative for any acute pathology - Consult nephro if necessary.    Nonspecific abdominal pain, improved -Improved with PPI twice daily, advised to continue this for at least 30 days but beyond this if it persists, may benefit from EGD.  Sooner if necessary. -CT of the abdomen pelvis is negative for acute pathology. - Lipase is very minimally elevated.  Does have history of chronic pancreatitis  Diabetes mellitus type 2 -Hemoglobin A1c 7.3.  Continues with sliding scale and Accu-Chek.  Sterile pyuria -No bacteria seen.  Continue to monitor symptoms.  Hyperlipidemia -Lipitor 40 mg daily  GERD -PPI daily  DVT prophylaxis: Lovenox Code Status: Full code Family Communication: None at bedside Disposition Plan: Maintain hospital stay for IV fluid until renal function has improved.  Hopefully we can discharge in next 24-48 hours   Consultants:   None  Procedures:   None  Antimicrobials:   None   Subjective: Feels better making urine.  Walking around in the room.  Review of Systems Otherwise negative except as per HPI, including: General = no fevers, chills, dizziness, malaise, fatigue HEENT/EYES = negative for pain, redness, loss of vision, double vision, blurred vision, loss of hearing, sore throat, hoarseness, dysphagia Cardiovascular= negative for chest pain, palpitation, murmurs, lower extremity swelling Respiratory/lungs= negative for shortness of breath, cough, hemoptysis, wheezing, mucus production Gastrointestinal= negative for nausea, vomiting,, abdominal pain, melena, hematemesis Genitourinary= negative for Dysuria, Hematuria, Change in Urinary Frequency MSK = Negative for arthralgia, myalgias, Back Pain, Joint swelling  Neurology= Negative for headache, seizures, numbness, tingling  Psychiatry= Negative for anxiety, depression, suicidal and homocidal ideation Allergy/Immunology= Medication/Food allergy as listed  Skin= Negative for Rash, lesions, ulcers, itching   Objective: Vitals:   05/01/19 2001 05/01/19 2113 05/02/19 0401 05/02/19 0800  BP:  130/68 133/77   Pulse:  67 (!) 54 76  Resp:  17 18   Temp:  98.2 F (36.8 C) 97.6 F (36.4 C)   TempSrc:   Oral   SpO2: 98% 100% 99%   Weight:      Height:        Intake/Output Summary (Last 24 hours) at 05/02/2019 1053 Last data filed at 05/02/2019 0500 Gross per 24 hour  Intake 1542.55 ml  Output 1300 ml  Net 242.55 ml   Filed Weights   04/29/19 1351 04/30/19 0143 04/30/19 0547  Weight: 74.8 kg 74.5 kg 77.4 kg    Examination: Constitutional: NAD, calm, comfortable  Eyes: PERRL, lids and conjunctivae normal ENMT: Mucous membranes are moist. Posterior pharynx clear of any exudate or lesions.Normal dentition.  Neck: normal, supple, no masses, no thyromegaly Respiratory: clear to auscultation bilaterally, no wheezing, no crackles.  Normal respiratory effort. No accessory muscle use.  Cardiovascular: Regular rate and rhythm, no murmurs / rubs / gallops. No extremity edema. 2+ pedal pulses. No carotid bruits.  Abdomen: no tenderness, no masses palpated. No hepatosplenomegaly. Bowel sounds positive.  Musculoskeletal: no clubbing / cyanosis. No joint deformity upper and lower extremities. Good ROM, no contractures. Normal muscle tone.  Skin: no rashes, lesions, ulcers. No induration Neurologic: CN 2-12 grossly intact. Sensation intact, DTR normal. Strength 5/5 in all 4.  Psychiatric: Normal judgment and insight. Alert and oriented x 3. Normal mood.    Data Reviewed:   CBC: Recent Labs  Lab 04/29/19 1042 04/30/19 0426 05/01/19 0546 05/02/19 0610  WBC 9.3 8.5 7.8 6.9  NEUTROABS 4.3  --   --   --   HGB 17.3* 15.1 15.6 14.8  HCT 51.4 44.6 47.3 43.6  MCV 101.2* 99.8 101.7* 99.3  PLT 265 217 228 227   Basic Metabolic Panel: Recent Labs  Lab 04/29/19 1042 04/29/19 1412 04/30/19 0426 05/01/19 0546 05/02/19 0610  NA 137 136 134* 136 135  K 6.5* 6.0* 5.3* 4.9 4.9  CL 110 110 111 110 111  CO2 15* 16* 16* 18* 17*  GLUCOSE 127* 173* 133* 124* 138*  BUN 53* 56* 54* 48* 41*  CREATININE 5.03* 5.18* 4.52* 3.52* 3.05*  CALCIUM 9.7 9.6 8.8* 9.2 9.0  MG  --   --   --  2.0 2.0   GFR: Estimated Creatinine Clearance: 22.9 mL/min (A) (by C-G formula based on SCr of 3.05 mg/dL (H)). Liver Function Tests: Recent Labs  Lab 04/29/19 1042 04/29/19 1412  AST 17 17  ALT 16 14  ALKPHOS 64 65  BILITOT 0.3 0.2*  PROT 8.5* 8.6*  ALBUMIN 4.5 4.5   Recent Labs  Lab 04/29/19 1042 04/29/19 1412  LIPASE 147* 145*   No results for input(s): AMMONIA in the last 168 hours. Coagulation Profile: No results for input(s): INR, PROTIME in the last 168 hours. Cardiac Enzymes: No results for input(s): CKTOTAL, CKMB, CKMBINDEX, TROPONINI in the last 168 hours. BNP (last 3 results) No results for input(s): PROBNP in the last 8760  hours. HbA1C: Recent Labs    04/29/19 1412  HGBA1C 7.3*   CBG: Recent Labs  Lab 05/01/19 0758 05/01/19 1118 05/01/19 1718 05/01/19 2031 05/02/19 0936  GLUCAP 131* 147* 104* 159* 174*   Lipid Profile: No results for input(s): CHOL, HDL, LDLCALC, TRIG, CHOLHDL, LDLDIRECT in the last 72 hours. Thyroid Function Tests: No results for input(s): TSH, T4TOTAL, FREET4, T3FREE, THYROIDAB in the last 72 hours. Anemia Panel: No results for input(s): VITAMINB12, FOLATE, FERRITIN, TIBC, IRON, RETICCTPCT in the last 72 hours. Sepsis Labs: No results for input(s): PROCALCITON, LATICACIDVEN in the last 168 hours.  Recent Results (from the past 240 hour(s))  SARS Coronavirus 2 (CEPHEID - Performed in [facility] hospital lab), Hosp Order     Status: None   Collection Time: 04/29/19  2:13 PM   Specimen: Nasopharyngeal Swab  Result Value Ref Range Status   SARS Coronavirus 2 NEGATIVE NEGATIVE Final    Comment: (NOTE) If result is NEGATIVE SARS-CoV-2 target nucleic acids are NOT DETECTED. The SARS-CoV-2 RNA is generally detectable in upper and lower  respiratory specimens during the acute phase of infection. The lowest  concentration of SARS-CoV-2 viral copies this assay can detect is 250  copies / mL. A negative result does not preclude SARS-CoV-2 infection  and should not be used as the sole basis for treatment or other  patient management decisions.  A negative result may occur with  improper specimen collection / handling, submission of specimen other  than nasopharyngeal swab, presence of viral mutation(s) within the  areas targeted by this assay, and inadequate number of viral copies  (<250 copies / mL). A negative result must be combined with clinical  observations, patient history, and epidemiological information. If result is POSITIVE SARS-CoV-2 target nucleic acids are DETECTED. The SARS-CoV-2 RNA is generally detectable in upper and lower  respiratory specimens dur ing the  acute phase of infection.  Positive  results are indicative of active infection with SARS-CoV-2.  Clinical  correlation with patient history and other diagnostic information is  necessary to determine patient infection status.  Positive results do  not rule out bacterial infection or co-infection with other viruses. If result is PRESUMPTIVE POSTIVE SARS-CoV-2 nucleic acids MAY BE PRESENT.   A presumptive positive result was obtained on the submitted specimen  and confirmed on repeat testing.  While 2019 novel coronavirus  (SARS-CoV-2) nucleic acids may be present in the submitted sample  additional confirmatory testing may be necessary for epidemiological  and / or clinical management purposes  to differentiate between  SARS-CoV-2 and other Sarbecovirus currently known to infect humans.  If clinically indicated additional testing with an alternate test  methodology [phone]) is advised. The SARS-CoV-2 RNA is generally  detectable in upper and lower respiratory sp ecimens during the acute  phase of infection. The expected result is Negative. Fact Sheet for Patients:  BoilerBrush.com.cy Fact Sheet for Healthcare Providers: https://pope.com/ This test is not yet approved or cleared by the [place] FDA and has been authorized for detection and/or diagnosis of SARS-CoV-2 by FDA under an Emergency Use Authorization (EUA).  This EUA will remain in effect (meaning this test can be used) for the duration of the COVID-19 declaration under Section 564(b)(1) of the Act, 21 U.S.C. section 360bbb-3(b)(1), unless the authorization is terminated or revoked sooner. Performed at [facility], [address], [place], [place] [address]          Radiology Studies: US Renal  Result Date: 04/30/2019 CLINICAL DATA:  Acute kidney injury.  Worsening renal function. EXAM: RENAL / URINARY TRACT ULTRASOUND COMPLETE COMPARISON:  CT 04/29/2019 FINDINGS:  Right Kidney: Renal measurements: 9.8 x 4.5 x 4.1 cm = volume: 94 mL. Normal echogenicity. Mild fullness of the renal collecting system. Left Kidney: Renal measurements: 11.1 x 5.4 x 5.2 cm = volume: 162 mL. Normal echogenicity. No hydronephrosis. Bladder: Appears normal for degree of bladder distention. IMPRESSION: Right kidney is smaller than the left and shows mild fullness of the renal collecting system without frank hydronephrosis. Left kidney appears sonographically normal. No bladder abnormality seen. Electronically Signed   By: Paulina Fusi M.D.   On: 04/30/2019 19:59        Scheduled Meds: . atorvastatin  40 mg Oral Daily  . enoxaparin (LOVENOX) injection  30 mg Subcutaneous Q24H  . insulin aspart  0-9 Units Subcutaneous TID WC  . pantoprazole  40 mg Oral BID AC   Continuous Infusions: . sodium chloride 75 mL/hr at 05/02/19 0340     LOS: 2 days   Time spent= 25 mins    Heaven Meeker Joline Maxcy, MD [facility]  If 7PM-7AM, please contact  night-coverage [email or web address] 05/02/2019, 10:53 AM

## 2019-05-02 NOTE — Progress Notes (Signed)
Telemetry reported HR in the 50's. Pt resting with eyes closed, resp even and unlabored. No distress.

## 2019-05-03 LAB — BASIC METABOLIC PANEL
Anion gap: 9 (ref 5–15)
BUN: 39 mg/dL — ABNORMAL HIGH (ref 8–23)
CO2: 15 mmol/L — ABNORMAL LOW (ref 22–32)
Calcium: 8.9 mg/dL (ref 8.9–10.3)
Chloride: 113 mmol/L — ABNORMAL HIGH (ref 98–111)
Creatinine, Ser: 2.74 mg/dL — ABNORMAL HIGH (ref 0.61–1.24)
GFR calc Af Amer: 26 mL/min — ABNORMAL LOW (ref >60)
GFR calc non Af Amer: 23 mL/min — ABNORMAL LOW (ref >60)
Glucose, Bld: 119 mg/dL — ABNORMAL HIGH (ref 70–99)
Potassium: 5.1 mmol/L (ref 3.5–5.1)
Sodium: 137 mmol/L (ref 135–145)

## 2019-05-03 LAB — CBC
HCT: 43.4 % (ref 39.0–52.0)
Hemoglobin: 14.7 g/dL (ref 13.0–17.0)
MCH: 33.8 pg (ref 26.0–34.0)
MCHC: 33.9 g/dL (ref 30.0–36.0)
MCV: 99.8 fL (ref 80.0–100.0)
Platelets: 210 10*3/uL (ref 150–400)
RBC: 4.35 MIL/uL (ref 4.22–5.81)
RDW: 12.4 % (ref 11.5–15.5)
WBC: 6.5 10*3/uL (ref 4.0–10.5)
nRBC: 0 % (ref 0.0–0.2)

## 2019-05-03 LAB — GLUCOSE, CAPILLARY
Glucose-Capillary: 126 mg/dL — ABNORMAL HIGH (ref 70–99)
Glucose-Capillary: 158 mg/dL — ABNORMAL HIGH (ref 70–99)
Glucose-Capillary: 169 mg/dL — ABNORMAL HIGH (ref 70–99)
Glucose-Capillary: 124 mg/dL — ABNORMAL HIGH (ref 70–99)

## 2019-05-03 LAB — MAGNESIUM: Magnesium: 1.8 mg/dL (ref 1.7–2.4)

## 2019-05-03 MED ORDER — VITAMIN B-1 100 MG PO TABS
100.0000 mg | ORAL_TABLET | Freq: Every day | ORAL | Status: DC
  Administered 2019-05-03 – 2019-05-05 (×3): 100 mg via ORAL
  Filled 2019-05-03 (×3): qty 1

## 2019-05-03 MED ORDER — THIAMINE HCL 100 MG/ML IJ SOLN: 100.0000 mg | Freq: Every day | INTRAMUSCULAR | Status: DC

## 2019-05-03 MED ORDER — ADULT MULTIVITAMIN W/MINERALS CH
1.0000 | ORAL_TABLET | Freq: Every day | ORAL | Status: DC
  Administered 2019-05-03 – 2019-05-05 (×3): 1 via ORAL
  Filled 2019-05-03 (×3): qty 1

## 2019-05-03 MED ORDER — FOLIC ACID 1 MG PO TABS
1.0000 mg | ORAL_TABLET | Freq: Every day | ORAL | Status: DC
  Administered 2019-05-03 – 2019-05-05 (×3): 1 mg via ORAL
  Filled 2019-05-03 (×3): qty 1

## 2019-05-03 MED ORDER — SODIUM CHLORIDE 0.9 % IV SOLN
INTRAVENOUS | Status: DC
  Administered 2019-05-03 – 2019-05-04 (×3): via INTRAVENOUS

## 2019-05-03 MED ORDER — LORAZEPAM 1 MG PO TABS: 1.0000 mg | ORAL_TABLET | Freq: Four times a day (QID) | ORAL | Status: DC | PRN

## 2019-05-03 MED ORDER — LORAZEPAM 2 MG/ML IJ SOLN: 1.0000 mg | Freq: Four times a day (QID) | INTRAMUSCULAR | Status: DC | PRN

## 2019-05-03 NOTE — Progress Notes (Signed)
PROGRESS NOTE  Dylan KOBLE WUJ:[id number] DOB: May 19, 1951 DOA: 04/29/2019 PCP: Wendie Simmer, MD  Brief History:  68 y.o. male with medical history significant of pancreatitis in the past, non-insulin-dependent diabetes, hypertension, history of alcohol abuse, GERD.  Patient has been ill for approximately 3 weeks with a 15 to 20 pound weight loss.  Has had a very poor appetite and has had minimal fluids.  He describes his pain as being epigastric in nature and rates it as moderate. His primary care physician has already set up an appointment with nephrology because of declining renal function.  Patient saw his gastroenterologist on the day of admission for abdominal pain.  Laboratory drawn at the GI office revealed a creatinine of 5 and a potassium of 6.5.  He was instructed to present to the emergency department for evaluation and probable admission for treatment.  Pt states he takes 10 "Excedrin" per week and intermittent ibuprofen.  Assessment/Plan: Acute kidney injury -due to volume depletion and NSAIDS in setting of lisinopril -pt likely has underlying CKD given clinical hx -CT of the abdomen pelvis showed infrarenal 3.2 cm abdominal aortic aneurysm.  Otherwise no acute findings. -Renal ultrasound-negative for any acute pathology - Continue IVF - serum creatinine peaked 5.18  Epigastric pain -Improved with PPI twice daily, advised to continue this for at least 30 days but beyond this if it persists, may benefit from EGD.  -CT of the abdomen pelvis is negative for acute pathology. - Lipase is very minimally elevated.  - pt now tolerating diet -pain improved with PPI  Diabetes mellitus type 2 -Hemoglobin A1c 7.3.  Continues with sliding scale and Accu-Chek.  Sterile pyuria -No bacteria seen.  Continue to monitor symptoms.  Hyperlipidemia -Lipitor 40 mg daily  GERD -PPI bid  Alcohol dependence -start CIWA       Disposition Plan:   Home in 1-2 days   Family Communication:  Spouse updated on phone  Consultants:  none  Code Status:  FULL  DVT Prophylaxis:   [place] Lovenox   Procedures: As Listed in Progress Note Above  Antibiotics: None       Subjective: Patient denies fevers, chills, headache, chest pain, dyspnea, nausea, vomiting, diarrhea, abdominal pain, dysuria, hematuria, hematochezia, and melena.   Objective: Vitals:   05/02/19 0800 05/02/19 2106 05/03/19 0500 05/03/19 1306  BP:  (!) 143/76 (!) 142/78 119/69  Pulse: 76 61 (!) 53 75  Resp:  17 18 16   Temp:  98.4 F (36.9 C) 98.7 F (37.1 C) 98.4 F (36.9 C)  TempSrc:   Oral Oral  SpO2:  100% 100% 99%  Weight:      Height:        Intake/Output Summary (Last 24 hours) at 05/03/2019 1657 Last data filed at 05/03/2019 1522 Gross per 24 hour  Intake 3328.44 ml  Output 700 ml  Net 2628.44 ml   Weight change:  Exam:   General:  Pt is alert, follows commands appropriately, not in acute distress  HEENT: No icterus, No thrush, No neck mass, [place]/AT  Cardiovascular: RRR, S1/S2, no rubs, no gallops  Respiratory: CTA bilaterally, no wheezing, no crackles, no rhonchi  Abdomen: Soft/+BS, non tender, non distended, no guarding  Extremities: No edema, No lymphangitis, No petechiae, No rashes, no synovitis   Data Reviewed: I have personally reviewed following labs and imaging studies Basic Metabolic Panel: Recent Labs  Lab 04/29/19 1412 04/30/19 0426 05/01/19 0546 05/02/19 0610 05/03/19 9562  NA 136 134* 136 135 137  K 6.0* 5.3* 4.9 4.9 5.1  CL 110 111 110 111 113*  CO2 16* 16* 18* 17* 15*  GLUCOSE 173* 133* 124* 138* 119*  BUN 56* 54* 48* 41* 39*  CREATININE 5.18* 4.52* 3.52* 3.05* 2.74*  CALCIUM 9.6 8.8* 9.2 9.0 8.9  MG  --   --  2.0 2.0 1.8   Liver Function Tests: Recent Labs  Lab 04/29/19 1042 04/29/19 1412  AST 17 17  ALT 16 14  ALKPHOS 64 65  BILITOT 0.3 0.2*  PROT 8.5* 8.6*  ALBUMIN 4.5 4.5   Recent Labs  Lab 04/29/19 1042 04/29/19  1412  LIPASE 147* 145*   No results for input(s): AMMONIA in the last 168 hours. Coagulation Profile: No results for input(s): INR, PROTIME in the last 168 hours. CBC: Recent Labs  Lab 04/29/19 1042 04/30/19 0426 05/01/19 0546 05/02/19 0610 05/03/19 0517  WBC 9.3 8.5 7.8 6.9 6.5  NEUTROABS 4.3  --   --   --   --   HGB 17.3* 15.1 15.6 14.8 14.7  HCT 51.4 44.6 47.3 43.6 43.4  MCV 101.2* 99.8 101.7* 99.3 99.8  PLT 265 217 228 227 210   Cardiac Enzymes: No results for input(s): CKTOTAL, CKMB, CKMBINDEX, TROPONINI in the last 168 hours. BNP: Invalid input(s): POCBNP CBG: Recent Labs  Lab 05/02/19 1722 05/02/19 2107 05/03/19 0737 05/03/19 1109 05/03/19 1606  GLUCAP 89 203* 126* 158* 169*   HbA1C: No results for input(s): HGBA1C in the last 72 hours. Urine analysis:    Component Value Date/Time   COLORURINE YELLOW 04/29/2019 1412   APPEARANCEUR CLEAR 04/29/2019 1412   LABSPEC 1.026 04/29/2019 1412   PHURINE 5.0 04/29/2019 1412   GLUCOSEU NEGATIVE 04/29/2019 1412   HGBUR SMALL (A) 04/29/2019 1412   BILIRUBINUR NEGATIVE 04/29/2019 1412   KETONESUR NEGATIVE 04/29/2019 1412   PROTEINUR 30 (A) 04/29/2019 1412   UROBILINOGEN 0.2 05/24/2013 2220   NITRITE NEGATIVE 04/29/2019 1412   LEUKOCYTESUR TRACE (A) 04/29/2019 1412   Sepsis Labs: @LABRCNTIP (procalcitonin:4,lacticidven:4) ) Recent Results (from the past 240 hour(s))  SARS Coronavirus 2 (CEPHEID - Performed in [place] hospital lab), Hosp Order     Status: None   Collection Time: 04/29/19  2:13 PM   Specimen: Nasopharyngeal Swab  Result Value Ref Range Status   SARS Coronavirus 2 NEGATIVE NEGATIVE Final    Comment: (NOTE) If result is NEGATIVE SARS-CoV-2 target nucleic acids are NOT DETECTED. The SARS-CoV-2 RNA is generally detectable in upper and lower  respiratory specimens during the acute phase of infection. The lowest  concentration of SARS-CoV-2 viral copies this assay can detect is 250  copies / mL.  A negative result does not preclude SARS-CoV-2 infection  and should not be used as the sole basis for treatment or other  patient management decisions.  A negative result may occur with  improper specimen collection / handling, submission of specimen other  than nasopharyngeal swab, presence of viral mutation(s) within the  areas targeted by this assay, and inadequate number of viral copies  (<250 copies / mL). A negative result must be combined with clinical  observations, patient history, and epidemiological information. If result is POSITIVE SARS-CoV-2 target nucleic acids are DETECTED. The SARS-CoV-2 RNA is generally detectable in upper and lower  respiratory specimens dur ing the acute phase of infection.  Positive  results are indicative of active infection with SARS-CoV-2.  Clinical  correlation with patient history and other diagnostic information is  necessary to determine patient infection status.  Positive results do  not rule out bacterial infection or co-infection with other viruses. If result is PRESUMPTIVE POSTIVE SARS-CoV-2 nucleic acids MAY BE PRESENT.   A presumptive positive result was obtained on the submitted specimen  and confirmed on repeat testing.  While 2019 novel coronavirus  (SARS-CoV-2) nucleic acids may be present in the submitted sample  additional confirmatory testing may be necessary for epidemiological  and / or clinical management purposes  to differentiate between  SARS-CoV-2 and other Sarbecovirus currently known to infect humans.  If clinically indicated additional testing with an alternate test  methodology [phone]) is advised. The SARS-CoV-2 RNA is generally  detectable in upper and lower respiratory sp ecimens during the acute  phase of infection. The expected result is Negative. Fact Sheet for Patients:  StrictlyIdeas.no Fact Sheet for Healthcare Providers: BankingDealers.co.za This test is not  yet approved or cleared by the [place] FDA and has been authorized for detection and/or diagnosis of SARS-CoV-2 by FDA under an Emergency Use Authorization (EUA).  This EUA will remain in effect (meaning this test can be used) for the duration of the COVID-19 declaration under Section 564(b)(1) of the Act, 21 U.S.C. section 360bbb-3(b)(1), unless the authorization is terminated or revoked sooner. Performed at [facility], [address], [place], [place] [address]      Scheduled Meds: . atorvastatin  40 mg Oral Daily  . enoxaparin (LOVENOX) injection  30 mg Subcutaneous Q24H  . insulin aspart  0-9 Units Subcutaneous TID WC  . pantoprazole  40 mg Oral BID AC   Continuous Infusions: . sodium chloride 100 mL/hr at 05/03/19 1522    Procedures/Studies: Ct Abdomen Pelvis Wo Contrast  Result Date: 04/29/2019 CLINICAL DATA:  sent from his PCP with abnormal labs. Patient has history of pancreatitis, diabetes, hypertension and gastritis. He reports that over the last 2 weeks he has been feeling unwell. His p.o. intake has gone down. He has been having some epigastric abdominal pain that is nonradiating. He denies any nausea, vomiting, diarrhea, blood loss. He denies any new medications, recent fevers or chills.He went to his PCP today and was advised to come to the ER after his kidney was found to be damage. EXAM: CT ABDOMEN AND PELVIS WITHOUT CONTRAST TECHNIQUE: Multidetector CT imaging of the abdomen and pelvis was performed following the standard protocol without IV contrast. COMPARISON:  05/24/2013 FINDINGS: Lower chest: No acute findings. Hepatobiliary: No focal liver abnormality is seen. No gallstones, gallbladder wall thickening, or biliary dilatation. Pancreas: Unremarkable. No pancreatic ductal dilatation or surrounding inflammatory changes. Spleen: Normal in size without focal abnormality. Adrenals/Urinary Tract: No adrenal masses. Mild right renal cortical thinning. Kidneys normal in  position and orientation. No renal masses or stones. No hydronephrosis. Ureters are normal in course and in caliber.  No ureteral stones. Bladder is unremarkable. Stomach/Bowel: Stomach is unremarkable. Small bowel and colon are normal in caliber. No wall thickening or inflammation. There are scattered colonic diverticula without diverticulitis. What appears to be the appendix lies in the right upper quadrant. It is dilated, to 1.1 cm, but has a thin wall and contains air and a small amount of contrast. This is also stable from the prior CT. Vascular/Lymphatic: Diffuse aortic ectasia atherosclerosis. Aorta measures 3.3 cm anterior-posterior at its hiatus. The infrarenal aorta measures 3.2 cm anterior-posterior. There is some ill-defined soft tissue surrounding the wall of the infrarenal abdominal aorta extending to the proximal iliac arteries. This soft tissue is new from  the prior CT and the infrarenal aorta has increased in diameter 2.6 cm in this location on the prior CT. No enlarged lymph nodes. Reproductive: Prominent prostate measuring 4.8 x 3.8 cm transversely. Other: Small adjacent fat contained midline anterior abdominal wall hernias. No bowel enters these. These are also stable from the prior CT. No ascites. Musculoskeletal: No fracture or acute finding. No osteoblastic or osteolytic lesions. IMPRESSION: 1. No acute findings. 2. Abdominal aortic aneurysm, increased in size from the prior CT, 3.2 cm is infrarenal portion. There is soft tissue attenuation surrounding the infrarenal abdominal aortic aneurysm extending to the proximal portion of the common iliac arteries. This suggest fibrosis. Consider follow-up contrast enhanced CT angiography to assess aorta and its branch vessels, if the patient can tolerate iodinated contrast. Electronically Signed   By: Lajean Manes M.D.   On: 04/29/2019 19:02   US Renal  Result Date: 04/30/2019 CLINICAL DATA:  Acute kidney injury.  Worsening renal function. EXAM:  RENAL / URINARY TRACT ULTRASOUND COMPLETE COMPARISON:  CT 04/29/2019 FINDINGS: Right Kidney: Renal measurements: 9.8 x 4.5 x 4.1 cm = volume: 94 mL. Normal echogenicity. Mild fullness of the renal collecting system. Left Kidney: Renal measurements: 11.1 x 5.4 x 5.2 cm = volume: 162 mL. Normal echogenicity. No hydronephrosis. Bladder: Appears normal for degree of bladder distention. IMPRESSION: Right kidney is smaller than the left and shows mild fullness of the renal collecting system without frank hydronephrosis. Left kidney appears sonographically normal. No bladder abnormality seen. Electronically Signed   By: Nelson Chimes M.D.   On: 04/30/2019 19:59    Orson Eva, DO  [facility] Pager [phone]  If 7PM-7AM, please contact night-coverage [email or web address] Password [email or web address] 05/03/2019, 4:57 PM   LOS: 3 days

## 2019-05-04 ENCOUNTER — Inpatient Hospital Stay (HOSPITAL_COMMUNITY): Payer: Medicare HMO

## 2019-05-04 LAB — GLUCOSE, CAPILLARY
Glucose-Capillary: 129 mg/dL — ABNORMAL HIGH (ref 70–99)
Glucose-Capillary: 201 mg/dL — ABNORMAL HIGH (ref 70–99)
Glucose-Capillary: 94 mg/dL (ref 70–99)
Glucose-Capillary: 172 mg/dL — ABNORMAL HIGH (ref 70–99)

## 2019-05-04 LAB — CBC
HCT: 41.4 % (ref 39.0–52.0)
Hemoglobin: 14.3 g/dL (ref 13.0–17.0)
MCH: 34.2 pg — ABNORMAL HIGH (ref 26.0–34.0)
MCHC: 34.5 g/dL (ref 30.0–36.0)
MCV: 99 fL (ref 80.0–100.0)
Platelets: 213 10*3/uL (ref 150–400)
RBC: 4.18 MIL/uL — ABNORMAL LOW (ref 4.22–5.81)
RDW: 12.5 % (ref 11.5–15.5)
WBC: 6.2 10*3/uL (ref 4.0–10.5)
nRBC: 0 % (ref 0.0–0.2)

## 2019-05-04 LAB — BASIC METABOLIC PANEL
Anion gap: 5 (ref 5–15)
BUN: 33 mg/dL — ABNORMAL HIGH (ref 8–23)
CO2: 17 mmol/L — ABNORMAL LOW (ref 22–32)
Calcium: 8.8 mg/dL — ABNORMAL LOW (ref 8.9–10.3)
Chloride: 116 mmol/L — ABNORMAL HIGH (ref 98–111)
Creatinine, Ser: 2.43 mg/dL — ABNORMAL HIGH (ref 0.61–1.24)
GFR calc Af Amer: 30 mL/min — ABNORMAL LOW (ref >60)
GFR calc non Af Amer: 26 mL/min — ABNORMAL LOW (ref >60)
Glucose, Bld: 123 mg/dL — ABNORMAL HIGH (ref 70–99)
Potassium: 4.7 mmol/L (ref 3.5–5.1)
Sodium: 138 mmol/L (ref 135–145)

## 2019-05-04 LAB — MAGNESIUM: Magnesium: 1.7 mg/dL (ref 1.7–2.4)

## 2019-05-04 MED ORDER — MAGNESIUM SULFATE 2 GM/50ML IV SOLN
2.0000 g | Freq: Once | INTRAVENOUS | Status: AC
  Administered 2019-05-04: 2 g via INTRAVENOUS
  Filled 2019-05-04: qty 50

## 2019-05-04 MED ORDER — SODIUM CHLORIDE 0.9 % IV SOLN
INTRAVENOUS | Status: AC
  Administered 2019-05-05: 13:00:00 via INTRAVENOUS
  Administered 2019-05-05: 100 mL/h via INTRAVENOUS

## 2019-05-04 NOTE — Plan of Care (Signed)

## 2019-05-04 NOTE — Progress Notes (Signed)
PROGRESS NOTE  DONEVAN BILLER ZOX:[id number] DOB: 12/22/1950 DOA: 04/29/2019 PCP: Wendie Simmer, MD  Brief History:  68 y.o.malewith medical history significant ofpancreatitis in the past, non-insulin-dependent diabetes, hypertension, history of alcohol abuse, GERD. Patient has been ill for approximately 3 weeks with a 15 to 20 pound weight loss. Has had a very poor appetite and has had minimal fluids. He describes his pain as being epigastric in nature and rates it as moderate. His primary care physician has already set up an appointment with nephrology because of declining renal function. Patient saw his gastroenterologist on the day of admission for abdominal pain. Laboratory drawn at the GI office revealed a creatinine of 5and a potassium of 6.5.He was instructed to present to the emergency department for evaluation and probable admission for treatment.  Pt states he takes 10 "Excedrin" per week and intermittent ibuprofen.  Assessment/Plan: Acute on chronic renal failure--CKD 3 -due to volume depletion and NSAIDS in setting of lisinopril -pt likely has underlying CKD given clinical hx -CT of the abdomen pelvis showed infrarenal 3.2 cm abdominal aortic aneurysm. Otherwise no acute findings. -Renal ultrasound-negative for any acute pathology -Continue IVF - serum creatinine peaked 5.18  Epigastric pain -Improved with PPI twice daily, advised to continue this for at least 30 days but beyond this if it persists, may benefit from EGD.  -CT of the abdomen pelvis is negative for acute pathology. -Lipase is very minimally elevated.  - pt now tolerating diet -pain improved with PPI  Diabetes mellitus type 2 -Hemoglobin A1c 7.3. Continues with sliding scale and Accu-Chek.  Left submandibular mass -pt states it's been there x 6 years without any change in size in last 6-12 months -never had it imaged -CT neck soft tissues  Hyperlipidemia -Lipitor 40 mg  daily  GERD -PPI bid  Alcohol dependence -start CIWA     Disposition Plan:   Home 05/05/19 if stable Family Communication:  Spouse updated on phone 8/2  Consultants:  none  Code Status:  FULL  DVT Prophylaxis:   [place] Lovenox   Procedures: As Listed in Progress Note Above  Antibiotics: None     Subjective: Patient denies fevers, chills, headache, chest pain, dyspnea, nausea, vomiting, diarrhea, abdominal pain, dysuria, hematuria, hematochezia, and melena.   Objective: Vitals:   05/03/19 1759 05/04/19 0000 05/04/19 0600 05/04/19 1223  BP: 139/64 136/77 (!) 150/68 (!) 132/91  Pulse: (!) 58 (!) 58 (!) 56 74  Resp: 18 16  18   Temp: 98.3 F (36.8 C) 97.9 F (36.6 C) 97.8 F (36.6 C) 97.9 F (36.6 C)  TempSrc: Oral Oral Oral Oral  SpO2: 100% 100% 100% 100%  Weight:      Height:        Intake/Output Summary (Last 24 hours) at 05/04/2019 1401 Last data filed at 05/04/2019 1244 Gross per 24 hour  Intake 3277.94 ml  Output 650 ml  Net 2627.94 ml   Weight change:  Exam:   General:  Pt is alert, follows commands appropriately, not in acute distress  HEENT: No icterus, No thrush, 6x8 cm L-submandibular mass, [place]/AT  Cardiovascular: RRR, S1/S2, no rubs, no gallops  Respiratory: CTA bilaterally, no wheezing, no crackles, no rhonchi  Abdomen: Soft/+BS, non tender, non distended, no guarding  Extremities: No edema, No lymphangitis, No petechiae, No rashes, no synovitis   Data Reviewed: I have personally reviewed following labs and imaging studies Basic Metabolic Panel: Recent Labs  Lab 04/30/19 0426 05/01/19  5465 05/02/19 0610 05/03/19 0517 05/04/19 0644  NA 134* 136 135 137 138  K 5.3* 4.9 4.9 5.1 4.7  CL 111 110 111 113* 116*  CO2 16* 18* 17* 15* 17*  GLUCOSE 133* 124* 138* 119* 123*  BUN 54* 48* 41* 39* 33*  CREATININE 4.52* 3.52* 3.05* 2.74* 2.43*  CALCIUM 8.8* 9.2 9.0 8.9 8.8*  MG  --  2.0 2.0 1.8 1.7   Liver Function Tests:  Recent Labs  Lab 04/29/19 1042 04/29/19 1412  AST 17 17  ALT 16 14  ALKPHOS 64 65  BILITOT 0.3 0.2*  PROT 8.5* 8.6*  ALBUMIN 4.5 4.5   Recent Labs  Lab 04/29/19 1042 04/29/19 1412  LIPASE 147* 145*   No results for input(s): AMMONIA in the last 168 hours. Coagulation Profile: No results for input(s): INR, PROTIME in the last 168 hours. CBC: Recent Labs  Lab 04/29/19 1042 04/30/19 0426 05/01/19 0546 05/02/19 0610 05/03/19 0517 05/04/19 0644  WBC 9.3 8.5 7.8 6.9 6.5 6.2  NEUTROABS 4.3  --   --   --   --   --   HGB 17.3* 15.1 15.6 14.8 14.7 14.3  HCT 51.4 44.6 47.3 43.6 43.4 41.4  MCV 101.2* 99.8 101.7* 99.3 99.8 99.0  PLT 265 217 228 227 210 213   Cardiac Enzymes: No results for input(s): CKTOTAL, CKMB, CKMBINDEX, TROPONINI in the last 168 hours. BNP: Invalid input(s): POCBNP CBG: Recent Labs  Lab 05/03/19 1109 05/03/19 1606 05/03/19 2105 05/04/19 0736 05/04/19 1140  GLUCAP 158* 169* 124* 129* 201*   HbA1C: No results for input(s): HGBA1C in the last 72 hours. Urine analysis:    Component Value Date/Time   COLORURINE YELLOW 04/29/2019 1412   APPEARANCEUR CLEAR 04/29/2019 1412   LABSPEC 1.026 04/29/2019 1412   PHURINE 5.0 04/29/2019 1412   GLUCOSEU NEGATIVE 04/29/2019 1412   HGBUR SMALL (A) 04/29/2019 1412   BILIRUBINUR NEGATIVE 04/29/2019 1412   KETONESUR NEGATIVE 04/29/2019 1412   PROTEINUR 30 (A) 04/29/2019 1412   UROBILINOGEN 0.2 05/24/2013 2220   NITRITE NEGATIVE 04/29/2019 1412   LEUKOCYTESUR TRACE (A) 04/29/2019 1412   Sepsis Labs: @LABRCNTIP (procalcitonin:4,lacticidven:4) ) Recent Results (from the past 240 hour(s))  SARS Coronavirus 2 (CEPHEID - Performed in [facility] hospital lab), Hosp Order     Status: None   Collection Time: 04/29/19  2:13 PM   Specimen: Nasopharyngeal Swab  Result Value Ref Range Status   SARS Coronavirus 2 NEGATIVE NEGATIVE Final    Comment: (NOTE) If result is NEGATIVE SARS-CoV-2 target nucleic acids are  NOT DETECTED. The SARS-CoV-2 RNA is generally detectable in upper and lower  respiratory specimens during the acute phase of infection. The lowest  concentration of SARS-CoV-2 viral copies this assay can detect is 250  copies / mL. A negative result does not preclude SARS-CoV-2 infection  and should not be used as the sole basis for treatment or other  patient management decisions.  A negative result may occur with  improper specimen collection / handling, submission of specimen other  than nasopharyngeal swab, presence of viral mutation(s) within the  areas targeted by this assay, and inadequate number of viral copies  (<250 copies / mL). A negative result must be combined with clinical  observations, patient history, and epidemiological information. If result is POSITIVE SARS-CoV-2 target nucleic acids are DETECTED. The SARS-CoV-2 RNA is generally detectable in upper and lower  respiratory specimens dur ing the acute phase of infection.  Positive  results are indicative of active  infection with SARS-CoV-2.  Clinical  correlation with patient history and other diagnostic information is  necessary to determine patient infection status.  Positive results do  not rule out bacterial infection or co-infection with other viruses. If result is PRESUMPTIVE POSTIVE SARS-CoV-2 nucleic acids MAY BE PRESENT.   A presumptive positive result was obtained on the submitted specimen  and confirmed on repeat testing.  While 2019 novel coronavirus  (SARS-CoV-2) nucleic acids may be present in the submitted sample  additional confirmatory testing may be necessary for epidemiological  and / or clinical management purposes  to differentiate between  SARS-CoV-2 and other Sarbecovirus currently known to infect humans.  If clinically indicated additional testing with an alternate test  methodology [phone]) is advised. The SARS-CoV-2 RNA is generally  detectable in upper and lower respiratory sp ecimens  during the acute  phase of infection. The expected result is Negative. Fact Sheet for Patients:  StrictlyIdeas.no Fact Sheet for Healthcare Providers: BankingDealers.co.za This test is not yet approved or cleared by the [place] FDA and has been authorized for detection and/or diagnosis of SARS-CoV-2 by FDA under an Emergency Use Authorization (EUA).  This EUA will remain in effect (meaning this test can be used) for the duration of the COVID-19 declaration under Section 564(b)(1) of the Act, 21 U.S.C. section 360bbb-3(b)(1), unless the authorization is terminated or revoked sooner. Performed at [facility], [address], [place], [place] [address]      Scheduled Meds: . atorvastatin  40 mg Oral Daily  . enoxaparin (LOVENOX) injection  30 mg Subcutaneous Q24H  . folic acid  1 mg Oral Daily  . insulin aspart  0-9 Units Subcutaneous TID WC  . multivitamin with minerals  1 tablet Oral Daily  . pantoprazole  40 mg Oral BID AC  . thiamine  100 mg Oral Daily   Or  . thiamine  100 mg Intravenous Daily   Continuous Infusions: . sodium chloride 100 mL/hr at 05/04/19 1359  . magnesium sulfate bolus IVPB 2 g (05/04/19 1400)    Procedures/Studies: Ct Abdomen Pelvis Wo Contrast  Result Date: 04/29/2019 CLINICAL DATA:  sent from his PCP with abnormal labs. Patient has history of pancreatitis, diabetes, hypertension and gastritis. He reports that over the last 2 weeks he has been feeling unwell. His p.o. intake has gone down. He has been having some epigastric abdominal pain that is nonradiating. He denies any nausea, vomiting, diarrhea, blood loss. He denies any new medications, recent fevers or chills.He went to his PCP today and was advised to come to the ER after his kidney was found to be damage. EXAM: CT ABDOMEN AND PELVIS WITHOUT CONTRAST TECHNIQUE: Multidetector CT imaging of the abdomen and pelvis was performed following the standard  protocol without IV contrast. COMPARISON:  05/24/2013 FINDINGS: Lower chest: No acute findings. Hepatobiliary: No focal liver abnormality is seen. No gallstones, gallbladder wall thickening, or biliary dilatation. Pancreas: Unremarkable. No pancreatic ductal dilatation or surrounding inflammatory changes. Spleen: Normal in size without focal abnormality. Adrenals/Urinary Tract: No adrenal masses. Mild right renal cortical thinning. Kidneys normal in position and orientation. No renal masses or stones. No hydronephrosis. Ureters are normal in course and in caliber.  No ureteral stones. Bladder is unremarkable. Stomach/Bowel: Stomach is unremarkable. Small bowel and colon are normal in caliber. No wall thickening or inflammation. There are scattered colonic diverticula without diverticulitis. What appears to be the appendix lies in the right upper quadrant. It is dilated, to 1.1 cm, but has a thin  wall and contains air and a small amount of contrast. This is also stable from the prior CT. Vascular/Lymphatic: Diffuse aortic ectasia atherosclerosis. Aorta measures 3.3 cm anterior-posterior at its hiatus. The infrarenal aorta measures 3.2 cm anterior-posterior. There is some ill-defined soft tissue surrounding the wall of the infrarenal abdominal aorta extending to the proximal iliac arteries. This soft tissue is new from the prior CT and the infrarenal aorta has increased in diameter 2.6 cm in this location on the prior CT. No enlarged lymph nodes. Reproductive: Prominent prostate measuring 4.8 x 3.8 cm transversely. Other: Small adjacent fat contained midline anterior abdominal wall hernias. No bowel enters these. These are also stable from the prior CT. No ascites. Musculoskeletal: No fracture or acute finding. No osteoblastic or osteolytic lesions. IMPRESSION: 1. No acute findings. 2. Abdominal aortic aneurysm, increased in size from the prior CT, 3.2 cm is infrarenal portion. There is soft tissue attenuation  surrounding the infrarenal abdominal aortic aneurysm extending to the proximal portion of the common iliac arteries. This suggest fibrosis. Consider follow-up contrast enhanced CT angiography to assess aorta and its branch vessels, if the patient can tolerate iodinated contrast. Electronically Signed   By: Lajean Manes M.D.   On: 04/29/2019 19:02   US Renal  Result Date: 04/30/2019 CLINICAL DATA:  Acute kidney injury.  Worsening renal function. EXAM: RENAL / URINARY TRACT ULTRASOUND COMPLETE COMPARISON:  CT 04/29/2019 FINDINGS: Right Kidney: Renal measurements: 9.8 x 4.5 x 4.1 cm = volume: 94 mL. Normal echogenicity. Mild fullness of the renal collecting system. Left Kidney: Renal measurements: 11.1 x 5.4 x 5.2 cm = volume: 162 mL. Normal echogenicity. No hydronephrosis. Bladder: Appears normal for degree of bladder distention. IMPRESSION: Right kidney is smaller than the left and shows mild fullness of the renal collecting system without frank hydronephrosis. Left kidney appears sonographically normal. No bladder abnormality seen. Electronically Signed   By: Nelson Chimes M.D.   On: 04/30/2019 19:59    Orson Eva, DO  [facility] Pager [phone]  If 7PM-7AM, please contact night-coverage [email or web address] Password [email or web address] 05/04/2019, 2:01 PM   LOS: 4 days

## 2019-05-05 LAB — BASIC METABOLIC PANEL
Anion gap: 6 (ref 5–15)
BUN: 30 mg/dL — ABNORMAL HIGH (ref 8–23)
CO2: 18 mmol/L — ABNORMAL LOW (ref 22–32)
Calcium: 8.8 mg/dL — ABNORMAL LOW (ref 8.9–10.3)
Chloride: 117 mmol/L — ABNORMAL HIGH (ref 98–111)
Creatinine, Ser: 2.39 mg/dL — ABNORMAL HIGH (ref 0.61–1.24)
GFR calc Af Amer: 31 mL/min — ABNORMAL LOW (ref >60)
GFR calc non Af Amer: 27 mL/min — ABNORMAL LOW (ref >60)
Glucose, Bld: 119 mg/dL — ABNORMAL HIGH (ref 70–99)
Potassium: 4.9 mmol/L (ref 3.5–5.1)
Sodium: 141 mmol/L (ref 135–145)

## 2019-05-05 LAB — GLUCOSE, CAPILLARY
Glucose-Capillary: 144 mg/dL — ABNORMAL HIGH (ref 70–99)
Glucose-Capillary: 129 mg/dL — ABNORMAL HIGH (ref 70–99)

## 2019-05-05 LAB — MAGNESIUM: Magnesium: 2 mg/dL (ref 1.7–2.4)

## 2019-05-05 MED ORDER — OMEPRAZOLE 40 MG PO CPDR: 40.0000 mg | DELAYED_RELEASE_CAPSULE | Freq: Two times a day (BID) | ORAL | 1 refills | Status: DC

## 2019-05-05 MED ORDER — AMLODIPINE BESYLATE 2.5 MG PO TABS: 2.5000 mg | ORAL_TABLET | Freq: Every day | ORAL | 1 refills | Status: DC

## 2019-05-05 MED ORDER — AMLODIPINE BESYLATE 5 MG PO TABS
2.5000 mg | ORAL_TABLET | Freq: Every day | ORAL | Status: DC
  Administered 2019-05-05: 15:00:00 via ORAL
  Filled 2019-05-05: qty 1

## 2019-05-05 NOTE — Progress Notes (Signed)
Discharged-Declined w/c. Stable.

## 2019-05-05 NOTE — Discharge Summary (Signed)
Physician Discharge Summary  Dylan Cline LEX:[id number] DOB: 03/21/1951 DOA: 04/29/2019  PCP: Wendie Simmer, MD  Admit date: 04/29/2019 Discharge date: 05/05/2019  Admitted From: Home Disposition:  Home  Recommendations for Outpatient Follow-up:  1. Follow up with PCP in 1-2 weeks 2. Please obtain BMP/CBC in one week 3. Please follow up with ENT, Dr. Benjamine Mola for possible parotid mass biopsy on 06/05/19 at 2:30 pm   Discharge Condition: Stable CODE STATUS: FULL Diet recommendation: Heart Healthy / Carb Modified   Brief/Interim Summary: 68 y.o.malewith medical history significant ofpancreatitis in the past, non-insulin-dependent diabetes, hypertension, history of alcohol abuse, GERD. Patient has been ill for approximately 3 weeks with a 15 to 20 pound weight loss. Has had a very poor appetite and has had minimal fluids. He describes his pain as being epigastric in nature and rates it as moderate.His primary care physician has already set up an appointment with nephrology because of declining renal function. Patient saw his gastroenterologist on the day of admission for abdominal pain. Laboratory drawn at the GI office revealed a creatinine of 5and a potassium of 6.5.He was instructed to present to the emergency department for evaluation and probable admission for treatment.Pt states he takes 10 "Excedrin" per week and intermittent ibuprofen.  Discharge Diagnoses:   Acute on chronic renal failure--CKD 3 -due to volume depletion and NSAIDS in setting of lisinopril -pt likely has underlying CKD given clinical hx -CT of the abdomen pelvis showed infrarenal 3.2 cm abdominal aortic aneurysm. Otherwise no acute findings. -Renal ultrasound-negative for any acute pathology -Continue IVF - serum creatinine peaked 5.18 -serum creatinine 2.39 on day of d/c -baseline creatinine likely 2.3-2.4 -pt has outpt renal appointment already with Dr. Carolin Sicks at [facility] -d/c  lisinopril and HCTZ  Epigastric pain -Improved with PPI twice daily, advised to continue this for at least 30 days but beyond this if it persists, may benefit from EGD.  -CT of the abdomen pelvis is negative for acute pathology. -Lipase is very minimally elevated. - pt now tolerating diet -pain improved with PPI -d/c home with omeprazole bid x 1 month, then q day  Diabetes mellitus type 2 -Hemoglobin A1c 7.3. Continues with sliding scale and Accu-Chek. -d/c metformin -follow up with PCP for further recs  Left submandibular mass -pt states it's been there x 6 years without any change in size in last 6-12 months -never had it imaged -CT neck soft tissues--26 x 27 x 42 mm. LEFT parotid neoplasm is suspected -pt and spouse informed -follow up with ENT for outpt biopsy--06/05/19 at 2:30 pm  Hyperlipidemia -Lipitor 40 mg daily  GERD -PPIbid x 1 month, then q day  Alcohol dependence -start CIWA -no signs of withdrawal  Hypertension -start low dose amlodipine  Discharge Instructions   Allergies as of 05/05/2019   No Known Allergies     Medication List    STOP taking these medications   hydrochlorothiazide 12.5 MG tablet Commonly known as: HYDRODIURIL   lisinopril 40 MG tablet Commonly known as: ZESTRIL   metFORMIN 1000 MG tablet Commonly known as: GLUCOPHAGE     TAKE these medications   amLODipine 2.5 MG tablet Commonly known as: NORVASC Take 1 tablet (2.5 mg total) by mouth daily.   atorvastatin 40 MG tablet Commonly known as: LIPITOR Take 40 mg by mouth daily.   omeprazole 40 MG capsule Commonly known as: PRILOSEC Take 1 capsule (40 mg total) by mouth 2 (two) times daily. What changed: when to take this  Follow-up Information    Leta Baptist, MD On 06/05/2019.   Specialty: Otolaryngology Why: at 2:30 pm  [facility] office) Contact information: [address] [place] [address] [phone]          No Known  Allergies  Consultations:  none   Procedures/Studies: Ct Abdomen Pelvis Wo Contrast  Result Date: 04/29/2019 CLINICAL DATA:  sent from his PCP with abnormal labs. Patient has history of pancreatitis, diabetes, hypertension and gastritis. He reports that over the last 2 weeks he has been feeling unwell. His p.o. intake has gone down. He has been having some epigastric abdominal pain that is nonradiating. He denies any nausea, vomiting, diarrhea, blood loss. He denies any new medications, recent fevers or chills.He went to his PCP today and was advised to come to the ER after his kidney was found to be damage. EXAM: CT ABDOMEN AND PELVIS WITHOUT CONTRAST TECHNIQUE: Multidetector CT imaging of the abdomen and pelvis was performed following the standard protocol without IV contrast. COMPARISON:  05/24/2013 FINDINGS: Lower chest: No acute findings. Hepatobiliary: No focal liver abnormality is seen. No gallstones, gallbladder wall thickening, or biliary dilatation. Pancreas: Unremarkable. No pancreatic ductal dilatation or surrounding inflammatory changes. Spleen: Normal in size without focal abnormality. Adrenals/Urinary Tract: No adrenal masses. Mild right renal cortical thinning. Kidneys normal in position and orientation. No renal masses or stones. No hydronephrosis. Ureters are normal in course and in caliber.  No ureteral stones. Bladder is unremarkable. Stomach/Bowel: Stomach is unremarkable. Small bowel and colon are normal in caliber. No wall thickening or inflammation. There are scattered colonic diverticula without diverticulitis. What appears to be the appendix lies in the right upper quadrant. It is dilated, to 1.1 cm, but has a thin wall and contains air and a small amount of contrast. This is also stable from the prior CT. Vascular/Lymphatic: Diffuse aortic ectasia atherosclerosis. Aorta measures 3.3 cm anterior-posterior at its hiatus. The infrarenal aorta measures 3.2 cm anterior-posterior.  There is some ill-defined soft tissue surrounding the wall of the infrarenal abdominal aorta extending to the proximal iliac arteries. This soft tissue is new from the prior CT and the infrarenal aorta has increased in diameter 2.6 cm in this location on the prior CT. No enlarged lymph nodes. Reproductive: Prominent prostate measuring 4.8 x 3.8 cm transversely. Other: Small adjacent fat contained midline anterior abdominal wall hernias. No bowel enters these. These are also stable from the prior CT. No ascites. Musculoskeletal: No fracture or acute finding. No osteoblastic or osteolytic lesions. IMPRESSION: 1. No acute findings. 2. Abdominal aortic aneurysm, increased in size from the prior CT, 3.2 cm is infrarenal portion. There is soft tissue attenuation surrounding the infrarenal abdominal aortic aneurysm extending to the proximal portion of the common iliac arteries. This suggest fibrosis. Consider follow-up contrast enhanced CT angiography to assess aorta and its branch vessels, if the patient can tolerate iodinated contrast. Electronically Signed   By: Lajean Manes M.D.   On: 04/29/2019 19:02   Ct Soft Tissue Neck Wo Contrast  Result Date: 05/04/2019 CLINICAL DATA:  Area of swelling behind and below LEFT ear, present for years, nonpainful. EXAM: CT NECK WITHOUT CONTRAST TECHNIQUE: Multidetector CT imaging of the neck was performed following the standard protocol without intravenous contrast. COMPARISON:  None. FINDINGS: Pharynx and larynx: Normal. No mass or swelling. Salivary glands: LEFT-sided Infra-auricular mass, suspected LEFT parotid origin. Difficult to definitively assess with noncontrast technique. Estimated cross-section 26 x 27 x 42 mm. See for instance series 3,  image 28. The lesion has mostly homogeneous attenuation in the 40-50 Hounsfield unit range, but there are some areas which are slightly more dense superiorly and inferiorly. RIGHT parotid is atrophic/fatty replaced. No submandibular  gland masses. Thyroid: Unremarkable. Lymph nodes: None enlarged or abnormal density. Vascular: Negative. Limited intracranial: Negative. Visualized orbits: Negative. Mastoids and visualized paranasal sinuses: Clear. Skeleton: No acute or aggressive features. Spondylosis. Upper chest: Aortic atherosclerosis. Other: None IMPRESSION: 1. Infra-auricular mass, suspected LEFT parotid origin. Estimated cross-section 26 x 27 x 42 mm. LEFT parotid neoplasm is suspected, although limited assessment on this noncontrast exam. Elective ENT consultation for tissue sampling is warranted. Aortic Atherosclerosis (ICD10-I70.0). Electronically Signed   By: Staci Righter M.D.   On: 05/04/2019 15:48   US Renal  Result Date: 04/30/2019 CLINICAL DATA:  Acute kidney injury.  Worsening renal function. EXAM: RENAL / URINARY TRACT ULTRASOUND COMPLETE COMPARISON:  CT 04/29/2019 FINDINGS: Right Kidney: Renal measurements: 9.8 x 4.5 x 4.1 cm = volume: 94 mL. Normal echogenicity. Mild fullness of the renal collecting system. Left Kidney: Renal measurements: 11.1 x 5.4 x 5.2 cm = volume: 162 mL. Normal echogenicity. No hydronephrosis. Bladder: Appears normal for degree of bladder distention. IMPRESSION: Right kidney is smaller than the left and shows mild fullness of the renal collecting system without frank hydronephrosis. Left kidney appears sonographically normal. No bladder abnormality seen. Electronically Signed   By: Nelson Chimes M.D.   On: 04/30/2019 19:59        Discharge Exam: Vitals:   05/05/19 0824 05/05/19 1200  BP:  139/64  Pulse:  (!) 58  Resp:  19  Temp:  97.9 F (36.6 C)  SpO2: 99% 98%   Vitals:   05/05/19 0004 05/05/19 0603 05/05/19 0824 05/05/19 1200  BP: 123/62 139/67  139/64  Pulse: (!) 54 (!) 58  (!) 58  Resp: 20 20  19   Temp: 98.4 F (36.9 C) 97.6 F (36.4 C)  97.9 F (36.6 C)  TempSrc: Oral Oral  Oral  SpO2: 100% 100% 99% 98%  Weight:      Height:        General: Pt is alert, awake, not in  acute distress Cardiovascular: RRR, S1/S2 +, no rubs, no gallops Respiratory: CTA bilaterally, no wheezing, no rhonchi Abdominal: Soft, NT, ND, bowel sounds + Extremities: no edema, no cyanosis   The results of significant diagnostics from this hospitalization (including imaging, microbiology, ancillary and laboratory) are listed below for reference.    Significant Diagnostic Studies: Ct Abdomen Pelvis Wo Contrast  Result Date: 04/29/2019 CLINICAL DATA:  sent from his PCP with abnormal labs. Patient has history of pancreatitis, diabetes, hypertension and gastritis. He reports that over the last 2 weeks he has been feeling unwell. His p.o. intake has gone down. He has been having some epigastric abdominal pain that is nonradiating. He denies any nausea, vomiting, diarrhea, blood loss. He denies any new medications, recent fevers or chills.He went to his PCP today and was advised to come to the ER after his kidney was found to be damage. EXAM: CT ABDOMEN AND PELVIS WITHOUT CONTRAST TECHNIQUE: Multidetector CT imaging of the abdomen and pelvis was performed following the standard protocol without IV contrast. COMPARISON:  05/24/2013 FINDINGS: Lower chest: No acute findings. Hepatobiliary: No focal liver abnormality is seen. No gallstones, gallbladder wall thickening, or biliary dilatation. Pancreas: Unremarkable. No pancreatic ductal dilatation or surrounding inflammatory changes. Spleen: Normal in size without focal abnormality. Adrenals/Urinary Tract: No adrenal masses. Mild right renal cortical thinning.  Kidneys normal in position and orientation. No renal masses or stones. No hydronephrosis. Ureters are normal in course and in caliber.  No ureteral stones. Bladder is unremarkable. Stomach/Bowel: Stomach is unremarkable. Small bowel and colon are normal in caliber. No wall thickening or inflammation. There are scattered colonic diverticula without diverticulitis. What appears to be the appendix lies in  the right upper quadrant. It is dilated, to 1.1 cm, but has a thin wall and contains air and a small amount of contrast. This is also stable from the prior CT. Vascular/Lymphatic: Diffuse aortic ectasia atherosclerosis. Aorta measures 3.3 cm anterior-posterior at its hiatus. The infrarenal aorta measures 3.2 cm anterior-posterior. There is some ill-defined soft tissue surrounding the wall of the infrarenal abdominal aorta extending to the proximal iliac arteries. This soft tissue is new from the prior CT and the infrarenal aorta has increased in diameter 2.6 cm in this location on the prior CT. No enlarged lymph nodes. Reproductive: Prominent prostate measuring 4.8 x 3.8 cm transversely. Other: Small adjacent fat contained midline anterior abdominal wall hernias. No bowel enters these. These are also stable from the prior CT. No ascites. Musculoskeletal: No fracture or acute finding. No osteoblastic or osteolytic lesions. IMPRESSION: 1. No acute findings. 2. Abdominal aortic aneurysm, increased in size from the prior CT, 3.2 cm is infrarenal portion. There is soft tissue attenuation surrounding the infrarenal abdominal aortic aneurysm extending to the proximal portion of the common iliac arteries. This suggest fibrosis. Consider follow-up contrast enhanced CT angiography to assess aorta and its branch vessels, if the patient can tolerate iodinated contrast. Electronically Signed   By: Lajean Manes M.D.   On: 04/29/2019 19:02   Ct Soft Tissue Neck Wo Contrast  Result Date: 05/04/2019 CLINICAL DATA:  Area of swelling behind and below LEFT ear, present for years, nonpainful. EXAM: CT NECK WITHOUT CONTRAST TECHNIQUE: Multidetector CT imaging of the neck was performed following the standard protocol without intravenous contrast. COMPARISON:  None. FINDINGS: Pharynx and larynx: Normal. No mass or swelling. Salivary glands: LEFT-sided Infra-auricular mass, suspected LEFT parotid origin. Difficult to definitively assess  with noncontrast technique. Estimated cross-section 26 x 27 x 42 mm. See for instance series 3, image 28. The lesion has mostly homogeneous attenuation in the 40-50 Hounsfield unit range, but there are some areas which are slightly more dense superiorly and inferiorly. RIGHT parotid is atrophic/fatty replaced. No submandibular gland masses. Thyroid: Unremarkable. Lymph nodes: None enlarged or abnormal density. Vascular: Negative. Limited intracranial: Negative. Visualized orbits: Negative. Mastoids and visualized paranasal sinuses: Clear. Skeleton: No acute or aggressive features. Spondylosis. Upper chest: Aortic atherosclerosis. Other: None IMPRESSION: 1. Infra-auricular mass, suspected LEFT parotid origin. Estimated cross-section 26 x 27 x 42 mm. LEFT parotid neoplasm is suspected, although limited assessment on this noncontrast exam. Elective ENT consultation for tissue sampling is warranted. Aortic Atherosclerosis (ICD10-I70.0). Electronically Signed   By: Staci Righter M.D.   On: 05/04/2019 15:48   US Renal  Result Date: 04/30/2019 CLINICAL DATA:  Acute kidney injury.  Worsening renal function. EXAM: RENAL / URINARY TRACT ULTRASOUND COMPLETE COMPARISON:  CT 04/29/2019 FINDINGS: Right Kidney: Renal measurements: 9.8 x 4.5 x 4.1 cm = volume: 94 mL. Normal echogenicity. Mild fullness of the renal collecting system. Left Kidney: Renal measurements: 11.1 x 5.4 x 5.2 cm = volume: 162 mL. Normal echogenicity. No hydronephrosis. Bladder: Appears normal for degree of bladder distention. IMPRESSION: Right kidney is smaller than the left and shows mild fullness of the renal collecting system without frank hydronephrosis.  Left kidney appears sonographically normal. No bladder abnormality seen. Electronically Signed   By: Nelson Chimes M.D.   On: 04/30/2019 19:59     Microbiology: Recent Results (from the past 240 hour(s))  SARS Coronavirus 2 (CEPHEID - Performed in [place] hospital lab), Hosp Order     Status:  None   Collection Time: 04/29/19  2:13 PM   Specimen: Nasopharyngeal Swab  Result Value Ref Range Status   SARS Coronavirus 2 NEGATIVE NEGATIVE Final    Comment: (NOTE) If result is NEGATIVE SARS-CoV-2 target nucleic acids are NOT DETECTED. The SARS-CoV-2 RNA is generally detectable in upper and lower  respiratory specimens during the acute phase of infection. The lowest  concentration of SARS-CoV-2 viral copies this assay can detect is 250  copies / mL. A negative result does not preclude SARS-CoV-2 infection  and should not be used as the sole basis for treatment or other  patient management decisions.  A negative result may occur with  improper specimen collection / handling, submission of specimen other  than nasopharyngeal swab, presence of viral mutation(s) within the  areas targeted by this assay, and inadequate number of viral copies  (<250 copies / mL). A negative result must be combined with clinical  observations, patient history, and epidemiological information. If result is POSITIVE SARS-CoV-2 target nucleic acids are DETECTED. The SARS-CoV-2 RNA is generally detectable in upper and lower  respiratory specimens dur ing the acute phase of infection.  Positive  results are indicative of active infection with SARS-CoV-2.  Clinical  correlation with patient history and other diagnostic information is  necessary to determine patient infection status.  Positive results do  not rule out bacterial infection or co-infection with other viruses. If result is PRESUMPTIVE POSTIVE SARS-CoV-2 nucleic acids MAY BE PRESENT.   A presumptive positive result was obtained on the submitted specimen  and confirmed on repeat testing.  While 2019 novel coronavirus  (SARS-CoV-2) nucleic acids may be present in the submitted sample  additional confirmatory testing may be necessary for epidemiological  and / or clinical management purposes  to differentiate between  SARS-CoV-2 and other  Sarbecovirus currently known to infect humans.  If clinically indicated additional testing with an alternate test  methodology [phone]) is advised. The SARS-CoV-2 RNA is generally  detectable in upper and lower respiratory sp ecimens during the acute  phase of infection. The expected result is Negative. Fact Sheet for Patients:  StrictlyIdeas.no Fact Sheet for Healthcare Providers: BankingDealers.co.za This test is not yet approved or cleared by the [place] FDA and has been authorized for detection and/or diagnosis of SARS-CoV-2 by FDA under an Emergency Use Authorization (EUA).  This EUA will remain in effect (meaning this test can be used) for the duration of the COVID-19 declaration under Section 564(b)(1) of the Act, 21 U.S.C. section 360bbb-3(b)(1), unless the authorization is terminated or revoked sooner. Performed at [facility], [address], [place], [place] [address]      Labs: Basic Metabolic Panel: Recent Labs  Lab 05/01/19 0546 05/02/19 0610 05/03/19 0517 05/04/19 0644 05/05/19 0608  NA 136 135 137 138 141  K 4.9 4.9 5.1 4.7 4.9  CL 110 111 113* 116* 117*  CO2 18* 17* 15* 17* 18*  GLUCOSE 124* 138* 119* 123* 119*  BUN 48* 41* 39* 33* 30*  CREATININE 3.52* 3.05* 2.74* 2.43* 2.39*  CALCIUM 9.2 9.0 8.9 8.8* 8.8*  MG 2.0 2.0 1.8 1.7 2.0   Liver Function Tests: Recent Labs  Lab  04/29/19 1042 04/29/19 1412  AST 17 17  ALT 16 14  ALKPHOS 64 65  BILITOT 0.3 0.2*  PROT 8.5* 8.6*  ALBUMIN 4.5 4.5   Recent Labs  Lab 04/29/19 1042 04/29/19 1412  LIPASE 147* 145*   No results for input(s): AMMONIA in the last 168 hours. CBC: Recent Labs  Lab 04/29/19 1042 04/30/19 0426 05/01/19 0546 05/02/19 0610 05/03/19 0517 05/04/19 0644  WBC 9.3 8.5 7.8 6.9 6.5 6.2  NEUTROABS 4.3  --   --   --   --   --   HGB 17.3* 15.1 15.6 14.8 14.7 14.3  HCT 51.4 44.6 47.3 43.6 43.4 41.4  MCV 101.2* 99.8 101.7* 99.3  99.8 99.0  PLT 265 217 228 227 210 213   Cardiac Enzymes: No results for input(s): CKTOTAL, CKMB, CKMBINDEX, TROPONINI in the last 168 hours. BNP: Invalid input(s): POCBNP CBG: Recent Labs  Lab 05/04/19 1140 05/04/19 1630 05/04/19 2105 05/05/19 0809 05/05/19 1208  GLUCAP 201* 94 172* 144* 129*    Time coordinating discharge:  36 minutes  Signed:  Orson Eva, DO [facility] Pager: [phone] 05/05/2019, 1:59 PM

## 2019-05-05 NOTE — Plan of Care (Signed)

## 2019-05-05 NOTE — Care Management Important Message (Signed)
Important Message  Patient Details  Name: Dylan Cline MRN: [id number] Date of Birth: Nov 12, 1950   Medicare Important Message Given:  Yes     Tommy Medal 05/05/2019, 2:45 PM

## 2019-06-02 ENCOUNTER — Other Ambulatory Visit: Payer: Self-pay

## 2019-06-02 ENCOUNTER — Encounter: Payer: Self-pay | Admitting: Nurse Practitioner

## 2019-06-02 ENCOUNTER — Ambulatory Visit: Payer: Medicare HMO | Admitting: Nurse Practitioner

## 2019-06-02 VITALS — BP 117/72 | HR 67 | Temp 96.6°F | Ht 69.0 in | Wt 170.8 lb

## 2019-06-02 DIAGNOSIS — R634 Abnormal weight loss: Secondary | ICD-10-CM

## 2019-06-02 DIAGNOSIS — K219 Gastro-esophageal reflux disease without esophagitis: Secondary | ICD-10-CM

## 2019-06-02 DIAGNOSIS — R1010 Upper abdominal pain, unspecified: Secondary | ICD-10-CM

## 2019-06-02 NOTE — Assessment & Plan Note (Signed)
Last recurrence of abdominal pain was about 1 week after discharge.  No further abdominal pain since.  He is currently on a PPI to control his GERD symptoms.  Is not had any alcohol since his last office visit.  Recommend he continue alcohol abstinence, continue NSAIDs, call us with any worsening or severe symptoms.

## 2019-06-02 NOTE — Assessment & Plan Note (Signed)
With improvement in other symptoms as well as acute on chronic kidney failure the patient's weight loss has stabilized.  In the past month he has gained 5 pounds.  Recommend he continue to monitor his weight.  Notify us of any ongoing weight loss.  Follow-up in 3 months.

## 2019-06-02 NOTE — Patient Instructions (Addendum)
Your health issues we discussed today were:   GERD (reflux/heartburn): 1. Continue taking omeprazole (Prilosec) once a day 2. You have 3 refills remaining on your prescription.  Call the pharmacy if you are near running out of your medication 3. Avoid all NSAIDs (ibuprofen, Motrin, Advil, Aleve, naproxen, Naprosyn, aspirin, anything with "NSAIDs" on the bottle) 4. Abstain from alcohol which can cause worsening reflux  Abdominal pain: 1. I am glad your abdominal pain is better 2. Call us if you have any worsening or severe abdominal pain  Weight loss: 1. I am glad your weight loss is stabilized 2. Continue to monitor your weight and let us know if you have any further weight loss  Overall I recommend:  1. Continue your other current medications 2. Follow-up with the kidney doctor and with ear nose and throat as previously recommended 3. Return for follow-up in 3 months 4. Call us if you have any questions or concerns.   Because of recent events of COVID-19 ("Coronavirus"), follow CDC recommendations:  1. Wash your hand frequently 2. Avoid touching your face 3. Stay away from people who are sick 4. If you have symptoms such as fever, cough, shortness of breath then call your healthcare provider for further guidance 5. If you are sick, STAY AT HOME unless otherwise directed by your healthcare provider. 6. Follow directions from state and national officials regarding staying safe   At [facility] we value your feedback. You may receive a survey about your visit today. Please share your experience as we strive to create trusting relationships with our patients to provide genuine, compassionate, quality care.  We appreciate your understanding and patience as we review any laboratory studies, imaging, and other diagnostic tests that are ordered as we care for you. Our office policy is 5 business days for review of these results, and any emergent or urgent results are  addressed in a timely manner for your best interest. If you do not hear from our office in 1 week, please contact us.   We also encourage the use of MyChart, which contains your medical information for your review as well. If you are not enrolled in this feature, an access code is on this after visit summary for your convenience. Thank you for allowing [place] to be involved in your care.  It was great to see you today!  I hope you have a great summer!!

## 2019-06-02 NOTE — Progress Notes (Signed)
Primary Care Physician:  Wendie Simmer, MD Primary Gastroenterologist:  Dr. Gala Romney  Chief Complaint  Patient presents with  . Gastroesophageal Reflux    doing okay  . Weight Loss    gained 5lbs  . Abdominal Pain    HPI:   Dylan Cline is a 68 y.o. male who presents for follow-up on abdominal pain, GERD, weight loss.  The patient was last seen in our office 04/29/2019 for alcohol induced pancreatitis, weight loss, abdominal pain, GERD.  Last EGD in 2006 for epigastric pain, nausea, vomiting, and abnormal CT of the abdomen suggesting pancreatic mass.  Findings included normal esophagus, submucosal petechial hemorrhages likely as a consequence of vomiting and trauma.  Recommended CA 19-9 and if elevated proceed with fine-needle aspiration CT-guided biopsy.  No CA 19-9 results.  Colonoscopy also completed in 2006 which was essentially normal but marginal prep and recommended Anusol and follow-up CT pancreatic protocol.  History of pancreatic pseudocyst and underwent apparent FNA at [facility] and repeat CT of the abdomen found decreased size and pancreatic pseudocyst, no new cysts or discrete solid pancreatic masses.  Most recent CT imaging in 2014 followed changes of acute pancreatitis involving the head of the pancreas, further decrease in size and small post pancreatitis fluid collections in the distal body and proximal tail.  No history of colonoscopy or endoscopy in our system.  At his last visit he noted decreased appetite and weight loss with abdominal pain.  Omeprazole 40 mg daily has not helped since he started this 2 days prior.  Some GERD symptoms.  Diagnosed with peptic ulcer disease "a long time ago" by our office.  Abdominal pain is midabdominal/just above the umbilicus described as nagging, sharp, dull and 8/10.  Pain is constant Dylan Cline takes Excedrin.  Drinks a pint to a pint and a half a day of wine.  Occasionally feels like he is going to pass out.  No other GI  complaints.  He kept saying "my stomach just feels hot."  Recommended labs, CT, Prilosec 40 mg daily, follow-up in 4 to 6 weeks.  Labs completed 04/29/2019 found normal CBC, CMP with critical potassium at 6.5, acute kidney failure with creatinine of 5.03, elevated lipase at 147.  The patient was referred to the emergency department.  Patient was subsequently admitted to the hospital from 04/29/2019 through 8/32020 for acute on chronic renal failure.  Likely underlying CKD given clinical history, CT of the abdomen and pelvis with a infrarenal 3.2 cm abdominal aortic aneurysm otherwise normal, renal ultrasound negative for acute pathology.  Serum creatinine declined and was 2.39 on the day of discharge with likely baseline creatinine of 2.3-2.4.  Outpatient follow-up with nephrology scheduled.  Epigastric pain improved with PPI, was tolerating a diet by discharge.  Recommended omeprazole twice daily for a month then once daily.  Noted left submandibular mass which the patient notes is been there for 6 years without any change in size.  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  Today he states he is doing okay overall. GERD is improved on Omeprazole once daily. No breakthrough GERD symptoms Weight is stable. Abdominal pain improved, no pain currently. Last episode of pain about 1 week after hospital d/c. Has ENT appointment this week. Has seen nephrology and he states they told him he's doing ok. Denies N/V, hematochezia, melena, fever, chills, unintentional weight loss. Denies chest pain and heart palpitations. Denies URI or  flu-like symptoms. Denies loss of sense of taste or smell. Denies chest pain, dyspnea, dizziness, lightheadedness, syncope, near syncope. Denies any other upper or lower GI symptoms.  Has stopped taking Excedrin.   Past Medical History:  Diagnosis Date  . Acute type 2 diabetes mellitus with manifestations ([place])   .  Hypertension   . Pancreatitis   . Pancreatitis, alcoholic, acute 16/10/958    Past Surgical History:  Procedure Laterality Date  . COLONOSCOPY   01/27/2005   AVW:[id number] hemorrhoids, otherwise normal rectum/Normal colon, marginal prep made the exam more difficult  . ESOPHAGOGASTRODUODENOSCOPY   10/12/2004   NWG:NFAOZH esophagus/Focal area of submucosal petechial hemorrhage in the fundus (likely a trauma-induced phenomenon secondary to vomiting), otherwise normal  . LAPAROSCOPIC GASTROTOMY W/ REPAIR OF ULCER      Current Outpatient Medications  Medication Sig Dispense Refill  . amLODipine (NORVASC) 2.5 MG tablet Take 1 tablet (2.5 mg total) by mouth daily. 30 tablet 1  . atorvastatin (LIPITOR) 40 MG tablet Take 40 mg by mouth daily.    Marland Kitchen omeprazole (PRILOSEC) 40 MG capsule Take 1 capsule (40 mg total) by mouth 2 (two) times daily. (Patient taking differently: Take 40 mg by mouth daily. ) 60 capsule 1   No current facility-administered medications for this visit.     Allergies as of 06/02/2019  . (No Known Allergies)    Family History  Problem Relation Age of Onset  . Diabetes Mother   . Diabetes Father   . Heart attack Maternal Grandfather   . Cancer Paternal Grandmother   . Heart attack Maternal Uncle   . Cancer Maternal Aunt   . Cancer Maternal Uncle   . Colon cancer Neg Hx   . Pancreatic cancer Neg Hx   . Stomach cancer Neg Hx     Social History   Socioeconomic History  . Marital status: Married    Spouse name: Not on file  . Number of children: Not on file  . Years of education: Not on file  . Highest education level: Not on file  Occupational History  . Not on file  Social Needs  . Financial resource strain: Not on file  . Food insecurity    Worry: Not on file    Inability: Not on file  . Transportation needs    Medical: Not on file    Non-medical: Not on file  Tobacco Use  . Smoking status: Current Every Day Smoker    Packs/day: 1.00    Years: 40.00     Pack years: 40.00    Types: Cigarettes    Start date: 12/01/1970  . Smokeless tobacco: Never Used  Substance and Sexual Activity  . Alcohol use: Yes    Alcohol/week: 0.0 standard drinks    Comment: 06/02/2019: None currently, last ETOH was 04/16/2019; previously: wine daily one pint  . Drug use: No  . Sexual activity: Not on file  Lifestyle  . Physical activity    Days per week: Not on file    Minutes per session: Not on file  . Stress: Not on file  Relationships  . Social Herbalist on phone: Not on file    Gets together: Not on file    Attends religious service: Not on file    Active member of club or organization: Not on file    Attends meetings of clubs or organizations: Not on file    Relationship status: Not on file  . Intimate partner violence  Fear of current or ex partner: Not on file    Emotionally abused: Not on file    Physically abused: Not on file    Forced sexual activity: Not on file  Other Topics Concern  . Not on file  Social History Narrative  . Not on file    Review of Systems: General: Negative for anorexia, weight loss, fever, chills, fatigue, weakness. ENT: Negative for hoarseness, difficulty swallowing. CV: Negative for chest pain, angina, palpitations, peripheral edema.  Respiratory: Negative for dyspnea at rest, cough, sputum, wheezing.  GI: See history of present illness. Endo: Negative for unusual weight change.  Heme: Negative for bruising or bleeding. Allergy: Negative for rash or hives.    Physical Exam: BP 117/72   Pulse 67   Temp (!) 96.6 F (35.9 C) (Oral)   Ht 5\' 9"  (1.753 m)   Wt 170 lb 12.8 oz (77.5 kg)   BMI 25.22 kg/m  General:   Alert and oriented. Pleasant and cooperative. Well-nourished and well-developed.  Eyes:  Without icterus, sclera clear and conjunctiva pink.  Ears:  Normal auditory acuity. Cardiovascular:  S1, S2 present without murmurs appreciated. Extremities without clubbing or edema.  Respiratory:  Clear to auscultation bilaterally. No wheezes, rales, or rhonchi. No distress.  Gastrointestinal:  +BS, soft, non-tender and non-distended. No HSM noted. No guarding or rebound. No masses appreciated.  Rectal:  Deferred  Musculoskalatal:  Symmetrical without gross deformities. Neurologic:  Alert and oriented x4;  grossly normal neurologically. Psych:  Alert and cooperative. Normal mood and affect. Heme/Lymph/Immune: No excessive bruising noted.    06/02/2019 9:09 AM   Disclaimer: This note was dictated with voice recognition software. Similar sounding words can inadvertently be transcribed and may not be corrected upon review.

## 2019-06-02 NOTE — Assessment & Plan Note (Signed)
GERD symptoms currently well managed on PPI.  Recommend the patient continue omeprazole.  Call for any refills.  Avoid NSAIDs and alcohol.  Follow-up in 3 months.

## 2019-06-05 ENCOUNTER — Other Ambulatory Visit: Payer: Self-pay

## 2019-06-05 ENCOUNTER — Ambulatory Visit (INDEPENDENT_AMBULATORY_CARE_PROVIDER_SITE_OTHER): Payer: Medicare HMO | Admitting: Otolaryngology

## 2019-06-05 DIAGNOSIS — D37030 Neoplasm of uncertain behavior of the parotid salivary glands: Secondary | ICD-10-CM | POA: Diagnosis not present

## 2019-06-26 ENCOUNTER — Other Ambulatory Visit: Payer: Self-pay | Admitting: Otolaryngology

## 2019-07-22 ENCOUNTER — Other Ambulatory Visit: Payer: Self-pay

## 2019-07-22 ENCOUNTER — Encounter (HOSPITAL_BASED_OUTPATIENT_CLINIC_OR_DEPARTMENT_OTHER): Payer: Self-pay | Admitting: *Deleted

## 2019-07-25 ENCOUNTER — Other Ambulatory Visit: Payer: Self-pay

## 2019-07-25 ENCOUNTER — Emergency Department (HOSPITAL_COMMUNITY)
Admission: EM | Admit: 2019-07-25 | Discharge: 2019-07-25 | Disposition: A | Discharge status: Home / Self Care | Payer: Medicare HMO | Attending: Emergency Medicine | Admitting: Emergency Medicine

## 2019-07-25 ENCOUNTER — Encounter (HOSPITAL_COMMUNITY): Payer: Self-pay

## 2019-07-25 ENCOUNTER — Encounter (HOSPITAL_BASED_OUTPATIENT_CLINIC_OR_DEPARTMENT_OTHER)
Admission: RE | Admit: 2019-07-25 | Discharge: 2019-07-25 | Disposition: A | Discharge status: Home / Self Care | Payer: Medicare HMO | Source: Ambulatory Visit | Attending: Otolaryngology | Admitting: Otolaryngology

## 2019-07-25 ENCOUNTER — Other Ambulatory Visit (HOSPITAL_COMMUNITY)
Admission: RE | Admit: 2019-07-25 | Discharge: 2019-07-25 | Disposition: A | Discharge status: Home / Self Care | Payer: Medicare HMO | Source: Ambulatory Visit | Attending: Otolaryngology | Admitting: Otolaryngology

## 2019-07-25 DIAGNOSIS — E1165 Type 2 diabetes mellitus with hyperglycemia: Secondary | ICD-10-CM | POA: Diagnosis present

## 2019-07-25 DIAGNOSIS — R739 Hyperglycemia, unspecified: Secondary | ICD-10-CM

## 2019-07-25 DIAGNOSIS — Z79899 Other long term (current) drug therapy: Secondary | ICD-10-CM | POA: Insufficient documentation

## 2019-07-25 DIAGNOSIS — F17210 Nicotine dependence, cigarettes, uncomplicated: Secondary | ICD-10-CM | POA: Diagnosis not present

## 2019-07-25 DIAGNOSIS — Z20828 Contact with and (suspected) exposure to other viral communicable diseases: Secondary | ICD-10-CM | POA: Diagnosis not present

## 2019-07-25 DIAGNOSIS — E119 Type 2 diabetes mellitus without complications: Secondary | ICD-10-CM | POA: Diagnosis not present

## 2019-07-25 DIAGNOSIS — I10 Essential (primary) hypertension: Secondary | ICD-10-CM | POA: Insufficient documentation

## 2019-07-25 DIAGNOSIS — K118 Other diseases of salivary glands: Secondary | ICD-10-CM | POA: Diagnosis present

## 2019-07-25 DIAGNOSIS — D110 Benign neoplasm of parotid gland: Secondary | ICD-10-CM | POA: Diagnosis not present

## 2019-07-25 LAB — COMPREHENSIVE METABOLIC PANEL
ALT: 14 U/L (ref 0–44)
AST: 10 U/L — ABNORMAL LOW (ref 15–41)
Albumin: 3.9 g/dL (ref 3.5–5.0)
Alkaline Phosphatase: 86 U/L (ref 38–126)
Anion gap: 11 (ref 5–15)
BUN: 35 mg/dL — ABNORMAL HIGH (ref 8–23)
CO2: 20 mmol/L — ABNORMAL LOW (ref 22–32)
Calcium: 9.3 mg/dL (ref 8.9–10.3)
Chloride: 99 mmol/L (ref 98–111)
Creatinine, Ser: 2.93 mg/dL — ABNORMAL HIGH (ref 0.61–1.24)
GFR calc Af Amer: 24 mL/min — ABNORMAL LOW (ref >60)
GFR calc non Af Amer: 21 mL/min — ABNORMAL LOW (ref >60)
Glucose, Bld: 537 mg/dL (ref 70–99)
Potassium: 4.3 mmol/L (ref 3.5–5.1)
Sodium: 130 mmol/L — ABNORMAL LOW (ref 135–145)
Total Bilirubin: 0.3 mg/dL (ref 0.3–1.2)
Total Protein: 7.3 g/dL (ref 6.5–8.1)

## 2019-07-25 LAB — BASIC METABOLIC PANEL
Anion gap: 10 (ref 5–15)
BUN: 37 mg/dL — ABNORMAL HIGH (ref 8–23)
CO2: 20 mmol/L — ABNORMAL LOW (ref 22–32)
Calcium: 9.4 mg/dL (ref 8.9–10.3)
Chloride: 102 mmol/L (ref 98–111)
Creatinine, Ser: 2.95 mg/dL — ABNORMAL HIGH (ref 0.61–1.24)
GFR calc Af Amer: 24 mL/min — ABNORMAL LOW (ref >60)
GFR calc non Af Amer: 21 mL/min — ABNORMAL LOW (ref >60)
Glucose, Bld: 412 mg/dL — ABNORMAL HIGH (ref 70–99)
Potassium: 4.7 mmol/L (ref 3.5–5.1)
Sodium: 132 mmol/L — ABNORMAL LOW (ref 135–145)

## 2019-07-25 LAB — CBC WITH DIFFERENTIAL/PLATELET
Abs Immature Granulocytes: 0.03 10*3/uL (ref 0.00–0.07)
Basophils Absolute: 0.1 10*3/uL (ref 0.0–0.1)
Basophils Relative: 1 %
Eosinophils Absolute: 0.1 10*3/uL (ref 0.0–0.5)
Eosinophils Relative: 1 %
HCT: 40.4 % (ref 39.0–52.0)
Hemoglobin: 14 g/dL (ref 13.0–17.0)
Immature Granulocytes: 0 %
Lymphocytes Relative: 38 %
Lymphs Abs: 4.2 10*3/uL — ABNORMAL HIGH (ref 0.7–4.0)
MCH: 33.6 pg (ref 26.0–34.0)
MCHC: 34.7 g/dL (ref 30.0–36.0)
MCV: 96.9 fL (ref 80.0–100.0)
Monocytes Absolute: 0.7 10*3/uL (ref 0.1–1.0)
Monocytes Relative: 7 %
Neutro Abs: 5.9 10*3/uL (ref 1.7–7.7)
Neutrophils Relative %: 53 %
Platelets: 306 10*3/uL (ref 150–400)
RBC: 4.17 MIL/uL — ABNORMAL LOW (ref 4.22–5.81)
RDW: 12.4 % (ref 11.5–15.5)
WBC: 11.1 10*3/uL — ABNORMAL HIGH (ref 4.0–10.5)
nRBC: 0 % (ref 0.0–0.2)

## 2019-07-25 LAB — CBG MONITORING, ED
Glucose-Capillary: 414 mg/dL — ABNORMAL HIGH (ref 70–99)
Glucose-Capillary: 309 mg/dL — ABNORMAL HIGH (ref 70–99)

## 2019-07-25 MED ORDER — SODIUM CHLORIDE 0.9 % IV BOLUS
1000.0000 mL | Freq: Once | INTRAVENOUS | Status: AC
  Administered 2019-07-25: 20:00:00 1000 mL via INTRAVENOUS

## 2019-07-25 MED ORDER — INSULIN ASPART 100 UNIT/ML ~~LOC~~ SOLN
5.0000 [IU] | Freq: Once | SUBCUTANEOUS | Status: AC
  Administered 2019-07-25: 5 [IU] via SUBCUTANEOUS
  Filled 2019-07-25: qty 1

## 2019-07-25 NOTE — ED Triage Notes (Signed)
Pt reports had blood work done this morning for preop.  Reports is scheduled to have a mass removed from left side of neck on Tuesday.  Reports staff called pt and told him his blood sugar was 520.  PT says he is supposed to be on po medication for blood sugar but says they stopped his blood sugar medication when he was in the hospital in Aug.

## 2019-07-25 NOTE — ED Provider Notes (Signed)
[facility] EMERGENCY DEPARTMENT Provider Note   CSN: [id number] Arrival date & time: 07/25/19  1429     History   Chief Complaint Chief Complaint  Patient presents with  . Hyperglycemia    HPI Dylan Cline is a 68 y.o. male.     Patient complains of weakness.  Patient has history of diabetes and has not been taking his Glucophage  The history is provided by the patient. No language interpreter was used.  Weakness Severity:  Mild Onset quality:  Gradual Timing:  Constant Progression:  Worsening Chronicity:  New Context: not alcohol use   Relieved by:  Nothing Worsened by:  Nothing Ineffective treatments:  None tried Associated symptoms: no abdominal pain, no chest pain, no cough, no diarrhea, no frequency, no headaches and no seizures     Past Medical History:  Diagnosis Date  . Acute type 2 diabetes mellitus with manifestations ([facility])    no meds currently  . CRI (chronic renal insufficiency), stage 3 (moderate)   . GERD (gastroesophageal reflux disease)   . Hypertension   . Mass of left parotid gland   . Pancreatitis   . Pancreatitis, alcoholic, acute 05/08/5783  . Smoker     Patient Active Problem List   Diagnosis Date Noted  . Hyperkalemia 04/30/2019  . Abdominal pain 04/29/2019  . Loss of weight 04/29/2019  . GERD (gastroesophageal reflux disease) 04/29/2019  . AKI (acute kidney injury) ([place]) 04/29/2019  . Acute hyperkalemia 04/29/2019  . Tobacco use 04/09/2019  . Multiple lung nodules 10/09/2018  . Acute type 2 diabetes mellitus with manifestations ([place])   . Hyperosmolar (nonketotic) coma ([place]) 08/11/2014  . Diabetes ([place]) 08/10/2014  . Pancreatitis, alcoholic, acute 69/62/9528  . Alcoholism ([place]) 08/10/2014  . Uncontrolled diabetes mellitus ([place]) 08/10/2014    Past Surgical History:  Procedure Laterality Date  . COLONOSCOPY   01/27/2005   UXL:[id number] hemorrhoids, otherwise normal rectum/Normal colon, marginal prep made the exam more  difficult  . ESOPHAGOGASTRODUODENOSCOPY   10/12/2004   OZD:GUYQIH esophagus/Focal area of submucosal petechial hemorrhage in the fundus (likely a trauma-induced phenomenon secondary to vomiting), otherwise normal  . LAPAROSCOPIC GASTROTOMY W/ REPAIR OF ULCER          Home Medications    Prior to Admission medications   Medication Sig Start Date End Date Taking? Authorizing Provider  amLODipine (NORVASC) 2.5 MG tablet Take 1 tablet (2.5 mg total) by mouth daily. 05/05/19  Yes Tat, Shanon Brow, MD  atorvastatin (LIPITOR) 40 MG tablet Take 40 mg by mouth daily.   Yes [provider]  hydrochlorothiazide (MICROZIDE) 12.5 MG capsule Take 12.5 mg by mouth daily.   Yes [provider]  omeprazole (PRILOSEC) 40 MG capsule Take 1 capsule (40 mg total) by mouth 2 (two) times daily. Patient not taking: Reported on 07/25/2019 05/05/19   Orson Eva, MD    Family History Family History  Problem Relation Age of Onset  . Diabetes Mother   . Diabetes Father   . Heart attack Maternal Grandfather   . Cancer Paternal Grandmother   . Heart attack Maternal Uncle   . Cancer Maternal Aunt   . Cancer Maternal Uncle   . Colon cancer Neg Hx   . Pancreatic cancer Neg Hx   . Stomach cancer Neg Hx     Social History Social History   Tobacco Use  . Smoking status: Current Every Day Smoker    Packs/day: 1.00    Years: 40.00    Pack years: 40.00  Types: Cigarettes    Start date: 12/01/1970  . Smokeless tobacco: Never Used  Substance Use Topics  . Alcohol use: Yes    Alcohol/week: 0.0 standard drinks    Comment: last ETOH 3 wks ago  . Drug use: No     Allergies   Patient has no known allergies.   Review of Systems Review of Systems  Constitutional: Negative for appetite change and fatigue.  HENT: Negative for congestion, ear discharge and sinus pressure.   Eyes: Negative for discharge.  Respiratory: Negative for cough.   Cardiovascular: Negative for chest pain.   Gastrointestinal: Negative for abdominal pain and diarrhea.  Genitourinary: Negative for frequency and hematuria.  Musculoskeletal: Negative for back pain.  Skin: Negative for rash.  Neurological: Positive for weakness. Negative for seizures and headaches.  Psychiatric/Behavioral: Negative for hallucinations.     Physical Exam Updated Vital Signs BP (!) 159/76 (BP Location: Left Arm)   Pulse 78   Temp 99.1 F (37.3 C) (Oral)   Resp 18   Ht 5\' 9"  (1.753 m)   Wt 74 kg   SpO2 100%   BMI 24.09 kg/m   Physical Exam Vitals signs and nursing note reviewed.  Constitutional:      Appearance: He is well-developed.  HENT:     Head: Normocephalic.     Nose: Nose normal.  Eyes:     General: No scleral icterus.    Conjunctiva/sclera: Conjunctivae normal.  Neck:     Musculoskeletal: Neck supple.     Thyroid: No thyromegaly.  Cardiovascular:     Rate and Rhythm: Normal rate and regular rhythm.     Heart sounds: No murmur. No friction rub. No gallop.   Pulmonary:     Breath sounds: No stridor. No wheezing or rales.  Chest:     Chest wall: No tenderness.  Abdominal:     General: There is no distension.     Tenderness: There is no abdominal tenderness. There is no rebound.  Musculoskeletal: Normal range of motion.  Lymphadenopathy:     Cervical: No cervical adenopathy.  Skin:    Findings: No erythema or rash.  Neurological:     Mental Status: He is alert and oriented to person, place, and time.     Motor: No abnormal muscle tone.     Coordination: Coordination normal.  Psychiatric:        Behavior: Behavior normal.      ED Treatments / Results  Labs (all labs ordered are listed, but only abnormal results are displayed) Labs Reviewed  BASIC METABOLIC PANEL - Abnormal; Notable for the following components:      Result Value   Sodium 132 (*)    CO2 20 (*)    Glucose, Bld 412 (*)    BUN 37 (*)    Creatinine, Ser 2.95 (*)    GFR calc non Af Amer 21 (*)    GFR calc Af  Amer 24 (*)    All other components within normal limits  CBC WITH DIFFERENTIAL/PLATELET - Abnormal; Notable for the following components:   WBC 11.1 (*)    RBC 4.17 (*)    Lymphs Abs 4.2 (*)    All other components within normal limits  CBG MONITORING, ED - Abnormal; Notable for the following components:   Glucose-Capillary 414 (*)    All other components within normal limits  CBG MONITORING, ED - Abnormal; Notable for the following components:   Glucose-Capillary 309 (*)    All other components within  normal limits    EKG None  Radiology No results found.  Procedures Procedures (including critical care time)  Medications Ordered in ED Medications  sodium chloride 0.9 % bolus 1,000 mL (0 mLs Intravenous Stopped 07/25/19 2059)  insulin aspart (novoLOG) injection 5 Units (5 Units Subcutaneous Given 07/25/19 2009)     Initial Impression / Assessment and Plan / ED Course  I have reviewed the triage vital signs and the nursing notes.  Pertinent labs & imaging results that were available during my care of the patient were reviewed by me and considered in my medical decision making (see chart for details).        Patient with poorly controlled diabetes.  Patient will start back on his Glucophage  Final Clinical Impressions(s) / ED Diagnoses   Final diagnoses:  Hyperglycemia    ED Discharge Orders    None       Milton Ferguson, MD 07/25/19 2120

## 2019-07-25 NOTE — Progress Notes (Signed)
Reviewed patient's lab work from this morning and it resulted with a critical blood glucose of 537. Dr. Deeann Saint office was made aware of the result as well as Dr. Doroteo Glassman with anesthesiology. Called patient and made him and his wife aware of the critical blood glucose. Urged patient to go to the emergency room because patient does not have any diabetic meds listed on file. Patient and wife stated they will call their primary care doctor and if they think it is necessary they will go to ER. Explained the serious nature of hyperglycemia. Patient and wife verbalized understanding.

## 2019-07-25 NOTE — Discharge Instructions (Addendum)
Take your Glucophage 1 pill a day and follow-up with your doctor next week.

## 2019-07-26 LAB — NOVEL CORONAVIRUS, NAA (HOSP ORDER, SEND-OUT TO REF LAB; TAT 18-24 HRS): SARS-CoV-2, NAA: NOT DETECTED

## 2019-07-28 NOTE — Progress Notes (Signed)
I called and spoke with the pt and his Thayer Headings and explained to them that if the pt comes in tomorrow and has a CBG greater than 350 that his surgery would be canceled. Pt's wife stated that he has now started Metformin since going into the ED and having a blood glucose of 537 on 07/25/19. Both pt and the wife stated their understanding of the need to cancel if his CBG is above 350 but plan to still be here for procedure in the AM. Pt also stated at the current moment his CBG at home was 237. I have spoken with ologist working today who agreed this was appropriate and I am calling Dr. Deeann Saint office now to inform them of the situation.

## 2019-07-29 ENCOUNTER — Encounter (HOSPITAL_BASED_OUTPATIENT_CLINIC_OR_DEPARTMENT_OTHER)
Admission: RE | Disposition: A | Discharge status: Home / Self Care | Payer: Self-pay | Source: Home / Self Care | Attending: Otolaryngology

## 2019-07-29 ENCOUNTER — Ambulatory Visit (HOSPITAL_BASED_OUTPATIENT_CLINIC_OR_DEPARTMENT_OTHER): Payer: Medicare HMO | Admitting: Certified Registered"

## 2019-07-29 ENCOUNTER — Ambulatory Visit (HOSPITAL_BASED_OUTPATIENT_CLINIC_OR_DEPARTMENT_OTHER)
Admission: RE | Admit: 2019-07-29 | Discharge: 2019-07-30 | Disposition: A | Discharge status: Home / Self Care | Payer: Medicare HMO | Attending: Otolaryngology | Admitting: Otolaryngology

## 2019-07-29 ENCOUNTER — Encounter (HOSPITAL_BASED_OUTPATIENT_CLINIC_OR_DEPARTMENT_OTHER): Payer: Self-pay

## 2019-07-29 ENCOUNTER — Other Ambulatory Visit: Payer: Self-pay

## 2019-07-29 DIAGNOSIS — Z9049 Acquired absence of other specified parts of digestive tract: Secondary | ICD-10-CM

## 2019-07-29 DIAGNOSIS — D110 Benign neoplasm of parotid gland: Secondary | ICD-10-CM | POA: Diagnosis not present

## 2019-07-29 DIAGNOSIS — I10 Essential (primary) hypertension: Secondary | ICD-10-CM | POA: Insufficient documentation

## 2019-07-29 DIAGNOSIS — D37030 Neoplasm of uncertain behavior of the parotid salivary glands: Secondary | ICD-10-CM | POA: Diagnosis not present

## 2019-07-29 DIAGNOSIS — E119 Type 2 diabetes mellitus without complications: Secondary | ICD-10-CM | POA: Insufficient documentation

## 2019-07-29 DIAGNOSIS — F17210 Nicotine dependence, cigarettes, uncomplicated: Secondary | ICD-10-CM | POA: Diagnosis not present

## 2019-07-29 HISTORY — DX: Chronic kidney disease, stage 3 unspecified: N18.30

## 2019-07-29 HISTORY — DX: Other diseases of salivary glands: K11.8

## 2019-07-29 HISTORY — DX: Nicotine dependence, unspecified, uncomplicated: F17.200

## 2019-07-29 HISTORY — DX: Gastro-esophageal reflux disease without esophagitis: K21.9

## 2019-07-29 HISTORY — PX: PAROTIDECTOMY: SHX2163

## 2019-07-29 LAB — GLUCOSE, CAPILLARY
Glucose-Capillary: 195 mg/dL — ABNORMAL HIGH (ref 70–99)
Glucose-Capillary: 248 mg/dL — ABNORMAL HIGH (ref 70–99)
Glucose-Capillary: 188 mg/dL — ABNORMAL HIGH (ref 70–99)
Glucose-Capillary: 304 mg/dL — ABNORMAL HIGH (ref 70–99)

## 2019-07-29 SURGERY — EXCISION, PAROTID GLAND
Anesthesia: General | Site: Neck | Laterality: Left

## 2019-07-29 MED ORDER — PROPOFOL 10 MG/ML IV BOLUS
INTRAVENOUS | Status: AC
  Filled 2019-07-29: qty 20

## 2019-07-29 MED ORDER — FENTANYL CITRATE (PF) 100 MCG/2ML IJ SOLN: 25.0000 ug | INTRAMUSCULAR | Status: DC | PRN

## 2019-07-29 MED ORDER — OXYCODONE-ACETAMINOPHEN 5-325 MG PO TABS
1.0000 | ORAL_TABLET | ORAL | Status: DC | PRN
  Administered 2019-07-29 (×2): 1 via ORAL
  Filled 2019-07-29 (×2): qty 1

## 2019-07-29 MED ORDER — SODIUM CHLORIDE 0.9 % IV SOLN
INTRAVENOUS | Status: DC | PRN
  Administered 2019-07-29: 100 ug/min via INTRAVENOUS

## 2019-07-29 MED ORDER — DEXAMETHASONE SODIUM PHOSPHATE 4 MG/ML IJ SOLN
INTRAMUSCULAR | Status: DC | PRN
  Administered 2019-07-29: 4 mg via INTRAVENOUS

## 2019-07-29 MED ORDER — PHENYLEPHRINE 40 MCG/ML (10ML) SYRINGE FOR IV PUSH (FOR BLOOD PRESSURE SUPPORT)
PREFILLED_SYRINGE | INTRAVENOUS | Status: DC | PRN
  Administered 2019-07-29 (×2): 120 ug via INTRAVENOUS
  Administered 2019-07-29 (×2): 80 ug via INTRAVENOUS
  Administered 2019-07-29: 120 ug via INTRAVENOUS

## 2019-07-29 MED ORDER — ATORVASTATIN CALCIUM 40 MG PO TABS
40.0000 mg | ORAL_TABLET | Freq: Every day | ORAL | Status: DC
  Administered 2019-07-29: 40 mg via ORAL

## 2019-07-29 MED ORDER — METOCLOPRAMIDE HCL 5 MG/ML IJ SOLN: 10.0000 mg | Freq: Once | INTRAMUSCULAR | Status: DC | PRN

## 2019-07-29 MED ORDER — AMOXICILLIN 875 MG PO TABS: 875.0000 mg | ORAL_TABLET | Freq: Two times a day (BID) | ORAL | 0 refills | Status: AC

## 2019-07-29 MED ORDER — INSULIN ASPART 100 UNIT/ML ~~LOC~~ SOLN
SUBCUTANEOUS | Status: AC
  Filled 2019-07-29: qty 1

## 2019-07-29 MED ORDER — MIDAZOLAM HCL 5 MG/5ML IJ SOLN
INTRAMUSCULAR | Status: DC | PRN
  Administered 2019-07-29: 2 mg via INTRAVENOUS

## 2019-07-29 MED ORDER — ZOLPIDEM TARTRATE 5 MG PO TABS: 5.0000 mg | ORAL_TABLET | Freq: Every evening | ORAL | Status: DC | PRN

## 2019-07-29 MED ORDER — PROPOFOL 10 MG/ML IV BOLUS
INTRAVENOUS | Status: DC | PRN
  Administered 2019-07-29: 150 mg via INTRAVENOUS

## 2019-07-29 MED ORDER — PANTOPRAZOLE SODIUM 40 MG PO TBEC
80.0000 mg | DELAYED_RELEASE_TABLET | Freq: Every day | ORAL | Status: DC
  Administered 2019-07-29: 80 mg via ORAL

## 2019-07-29 MED ORDER — MIDAZOLAM HCL 2 MG/2ML IJ SOLN
INTRAMUSCULAR | Status: AC
  Filled 2019-07-29: qty 2

## 2019-07-29 MED ORDER — EPHEDRINE 5 MG/ML INJ
INTRAVENOUS | Status: AC
  Filled 2019-07-29: qty 10

## 2019-07-29 MED ORDER — MORPHINE SULFATE (PF) 4 MG/ML IV SOLN: 2.0000 mg | INTRAVENOUS | Status: DC | PRN

## 2019-07-29 MED ORDER — FENTANYL CITRATE (PF) 100 MCG/2ML IJ SOLN
INTRAMUSCULAR | Status: AC
  Filled 2019-07-29: qty 2

## 2019-07-29 MED ORDER — AMLODIPINE BESYLATE 2.5 MG PO TABS
2.5000 mg | ORAL_TABLET | Freq: Every day | ORAL | Status: DC
  Administered 2019-07-29: 2.5 mg via ORAL

## 2019-07-29 MED ORDER — SUCCINYLCHOLINE CHLORIDE 200 MG/10ML IV SOSY
PREFILLED_SYRINGE | INTRAVENOUS | Status: AC
  Filled 2019-07-29: qty 20

## 2019-07-29 MED ORDER — EPHEDRINE SULFATE-NACL 50-0.9 MG/10ML-% IV SOSY
PREFILLED_SYRINGE | INTRAVENOUS | Status: DC | PRN
  Administered 2019-07-29: 10 mg via INTRAVENOUS

## 2019-07-29 MED ORDER — INSULIN ASPART 100 UNIT/ML ~~LOC~~ SOLN
0.0000 [IU] | Freq: Every day | SUBCUTANEOUS | Status: DC
  Administered 2019-07-29: 22:00:00 4 [IU] via SUBCUTANEOUS

## 2019-07-29 MED ORDER — LIDOCAINE-EPINEPHRINE 1 %-1:100000 IJ SOLN
INTRAMUSCULAR | Status: DC | PRN
  Administered 2019-07-29: 4 mL

## 2019-07-29 MED ORDER — LACTATED RINGERS IV SOLN
INTRAVENOUS | Status: DC
  Administered 2019-07-29 (×2): via INTRAVENOUS

## 2019-07-29 MED ORDER — CEFAZOLIN SODIUM-DEXTROSE 2-3 GM-%(50ML) IV SOLR
INTRAVENOUS | Status: DC | PRN
  Administered 2019-07-29: 2 g via INTRAVENOUS

## 2019-07-29 MED ORDER — MEPERIDINE HCL 25 MG/ML IJ SOLN: 6.2500 mg | INTRAMUSCULAR | Status: DC | PRN

## 2019-07-29 MED ORDER — PHENYLEPHRINE HCL (PRESSORS) 10 MG/ML IV SOLN
INTRAVENOUS | Status: AC
  Filled 2019-07-29: qty 1

## 2019-07-29 MED ORDER — INSULIN ASPART 100 UNIT/ML ~~LOC~~ SOLN
0.0000 [IU] | Freq: Three times a day (TID) | SUBCUTANEOUS | Status: DC
  Administered 2019-07-29: 5 [IU] via SUBCUTANEOUS
  Administered 2019-07-29: 17:00:00 3 [IU] via SUBCUTANEOUS
  Administered 2019-07-30: 8 [IU] via SUBCUTANEOUS

## 2019-07-29 MED ORDER — PHENYLEPHRINE 40 MCG/ML (10ML) SYRINGE FOR IV PUSH (FOR BLOOD PRESSURE SUPPORT)
PREFILLED_SYRINGE | INTRAVENOUS | Status: AC
  Filled 2019-07-29: qty 10

## 2019-07-29 MED ORDER — KCL IN DEXTROSE-NACL 20-5-0.45 MEQ/L-%-% IV SOLN
INTRAVENOUS | Status: DC
  Administered 2019-07-29: 12:00:00 via INTRAVENOUS
  Filled 2019-07-29: qty 1000

## 2019-07-29 MED ORDER — ROCURONIUM BROMIDE 10 MG/ML (PF) SYRINGE
PREFILLED_SYRINGE | INTRAVENOUS | Status: AC
  Filled 2019-07-29: qty 10

## 2019-07-29 MED ORDER — SUCCINYLCHOLINE CHLORIDE 20 MG/ML IJ SOLN
INTRAMUSCULAR | Status: DC | PRN
  Administered 2019-07-29: 100 mg via INTRAVENOUS
  Administered 2019-07-29: 50 mg via INTRAVENOUS

## 2019-07-29 MED ORDER — MIDAZOLAM HCL 2 MG/2ML IJ SOLN: 1.0000 mg | INTRAMUSCULAR | Status: DC | PRN

## 2019-07-29 MED ORDER — LIDOCAINE 2% (20 MG/ML) 5 ML SYRINGE
INTRAMUSCULAR | Status: AC
  Filled 2019-07-29: qty 10

## 2019-07-29 MED ORDER — OXYCODONE-ACETAMINOPHEN 5-325 MG PO TABS: 1.0000 | ORAL_TABLET | ORAL | 0 refills | Status: AC | PRN

## 2019-07-29 MED ORDER — DEXAMETHASONE SODIUM PHOSPHATE 10 MG/ML IJ SOLN
INTRAMUSCULAR | Status: AC
  Filled 2019-07-29: qty 2

## 2019-07-29 MED ORDER — ONDANSETRON HCL 4 MG/2ML IJ SOLN
INTRAMUSCULAR | Status: DC | PRN
  Administered 2019-07-29: 4 mg via INTRAVENOUS

## 2019-07-29 MED ORDER — CEFAZOLIN SODIUM 1 G IJ SOLR
INTRAMUSCULAR | Status: AC
  Filled 2019-07-29: qty 40

## 2019-07-29 MED ORDER — FENTANYL CITRATE (PF) 100 MCG/2ML IJ SOLN: 50.0000 ug | INTRAMUSCULAR | Status: DC | PRN

## 2019-07-29 MED ORDER — LIDOCAINE 2% (20 MG/ML) 5 ML SYRINGE
INTRAMUSCULAR | Status: DC | PRN
  Administered 2019-07-29: 100 mg via INTRAVENOUS

## 2019-07-29 MED ORDER — HYDROCHLOROTHIAZIDE 12.5 MG PO CAPS
12.5000 mg | ORAL_CAPSULE | Freq: Every day | ORAL | Status: DC
  Administered 2019-07-29: 12.5 mg via ORAL
  Filled 2019-07-29: qty 1

## 2019-07-29 MED ORDER — FENTANYL CITRATE (PF) 100 MCG/2ML IJ SOLN
INTRAMUSCULAR | Status: DC | PRN
  Administered 2019-07-29 (×2): 25 ug via INTRAVENOUS
  Administered 2019-07-29: 50 ug via INTRAVENOUS
  Administered 2019-07-29: 100 ug via INTRAVENOUS

## 2019-07-29 SURGICAL SUPPLY — 65 items
APPLICATOR DR MATTHEWS STRL (MISCELLANEOUS) ×3 IMPLANT
ATTRACTOMAT 16X20 MAGNETIC DRP (DRAPES) IMPLANT
BLADE SURG 15 STRL LF DISP TIS (BLADE) ×1 IMPLANT
BLADE SURG 15 STRL SS (BLADE) ×2
CANISTER SUCT 1200ML W/VALVE (MISCELLANEOUS) ×3 IMPLANT
CORD BIPOLAR FORCEPS 12FT (ELECTRODE) ×3 IMPLANT
COTTONBALL LRG STERILE PKG (GAUZE/BANDAGES/DRESSINGS) ×3 IMPLANT
COVER BACK TABLE REUSABLE LG (DRAPES) ×3 IMPLANT
COVER MAYO STAND REUSABLE (DRAPES) ×3 IMPLANT
COVER WAND RF STERILE (DRAPES) IMPLANT
DECANTER SPIKE VIAL GLASS SM (MISCELLANEOUS) ×1 IMPLANT
DERMABOND ADVANCED (GAUZE/BANDAGES/DRESSINGS) ×2
DERMABOND ADVANCED .7 DNX12 (GAUZE/BANDAGES/DRESSINGS) ×1 IMPLANT
DRAIN CHANNEL 10F 3/8 F FF (DRAIN) ×2 IMPLANT
DRAPE SURG 17X23 STRL (DRAPES) ×2 IMPLANT
DRAPE U-SHAPE 76X120 STRL (DRAPES) ×3 IMPLANT
ELECT COATED BLADE 2.86 ST (ELECTRODE) ×3 IMPLANT
ELECT PAIRED SUBDERMAL (MISCELLANEOUS) ×3
ELECT REM PT RETURN 9FT ADLT (ELECTROSURGICAL) ×3
ELECTRODE PAIRED SUBDERMAL (MISCELLANEOUS) ×1 IMPLANT
ELECTRODE REM PT RTRN 9FT ADLT (ELECTROSURGICAL) ×1 IMPLANT
EVACUATOR SILICONE 100CC (DRAIN) ×4 IMPLANT
FORCEPS BIPOLAR SPETZLER 8 1.0 (NEUROSURGERY SUPPLIES) ×3 IMPLANT
GAUZE 4X4 16PLY RFD (DISPOSABLE) ×5 IMPLANT
GLOVE BIO SURGEON STRL SZ 6.5 (GLOVE) ×2 IMPLANT
GLOVE BIO SURGEON STRL SZ7.5 (GLOVE) ×3 IMPLANT
GLOVE BIO SURGEONS STRL SZ 6.5 (GLOVE) ×2
GLOVE BIOGEL PI IND STRL 6.5 (GLOVE) IMPLANT
GLOVE BIOGEL PI IND STRL 7.0 (GLOVE) IMPLANT
GLOVE BIOGEL PI INDICATOR 6.5 (GLOVE) ×2
GLOVE BIOGEL PI INDICATOR 7.0 (GLOVE) ×2
GLOVE EXAM NITRILE MD LF STRL (GLOVE) ×2 IMPLANT
GOWN STRL REUS W/ TWL LRG LVL3 (GOWN DISPOSABLE) ×2 IMPLANT
GOWN STRL REUS W/ TWL XL LVL3 (GOWN DISPOSABLE) IMPLANT
GOWN STRL REUS W/TWL LRG LVL3 (GOWN DISPOSABLE) ×4
GOWN STRL REUS W/TWL XL LVL3 (GOWN DISPOSABLE) ×2
LOCATOR NERVE 3 VOLT (DISPOSABLE) IMPLANT
NDL HYPO 25X1 1.5 SAFETY (NEEDLE) ×1 IMPLANT
NEEDLE HYPO 25X1 1.5 SAFETY (NEEDLE) ×3 IMPLANT
NS IRRIG 1000ML POUR BTL (IV SOLUTION) ×3 IMPLANT
PACK BASIN DAY SURGERY FS (CUSTOM PROCEDURE TRAY) ×3 IMPLANT
PAD ALCOHOL SWAB (MISCELLANEOUS) ×6 IMPLANT
PENCIL BUTTON HOLSTER BLD 10FT (ELECTRODE) ×3 IMPLANT
PIN SAFETY STERILE (MISCELLANEOUS) ×2 IMPLANT
PROBE NERVBE PRASS .33 (MISCELLANEOUS) ×3 IMPLANT
SHEARS HARMONIC 9CM CVD (BLADE) ×3 IMPLANT
SLEEVE SCD COMPRESS KNEE MED (MISCELLANEOUS) ×3 IMPLANT
SPONGE INTESTINAL PEANUT (DISPOSABLE) ×5 IMPLANT
STAPLER VISISTAT 35W (STAPLE) IMPLANT
SUT ETHILON 3 0 PS 1 (SUTURE) ×2 IMPLANT
SUT PROLENE 5 0 PS 2 (SUTURE) ×3 IMPLANT
SUT SILK 2 0 PERMA HAND 18 BK (SUTURE) ×9 IMPLANT
SUT SILK 2 0 TIES 17X18 (SUTURE)
SUT SILK 2-0 18XBRD TIE BLK (SUTURE) IMPLANT
SUT SILK 3 0 TIES 17X18 (SUTURE) ×2
SUT SILK 3-0 18XBRD TIE BLK (SUTURE) ×1 IMPLANT
SUT SILK 4 0 TIES 17X18 (SUTURE) IMPLANT
SUT VIC AB 3-0 FS2 27 (SUTURE) IMPLANT
SUT VICRYL 4-0 PS2 18IN ABS (SUTURE) ×3 IMPLANT
SYR BULB 3OZ (MISCELLANEOUS) ×3 IMPLANT
SYR CONTROL 10ML LL (SYRINGE) ×3 IMPLANT
TOWEL GREEN STERILE FF (TOWEL DISPOSABLE) ×3 IMPLANT
TRAY DSU PREP LF (CUSTOM PROCEDURE TRAY) ×3 IMPLANT
TUBE CONNECTING 20'X1/4 (TUBING) ×1
TUBE CONNECTING 20X1/4 (TUBING) ×2 IMPLANT

## 2019-07-29 NOTE — Anesthesia Procedure Notes (Signed)
Procedure Name: Intubation Date/Time: 07/29/2019 8:36 AM Performed by: Lieutenant Diego, CRNA Pre-anesthesia Checklist: Patient identified, Emergency Drugs available, Suction available and Patient being monitored Patient Re-evaluated:Patient Re-evaluated prior to induction Oxygen Delivery Method: Circle system utilized Preoxygenation: Pre-oxygenation with 100% oxygen Induction Type: IV induction Ventilation: Mask ventilation without difficulty Laryngoscope Size: Miller and 2 Grade View: Grade I Tube type: Oral Tube size: 8.0 mm Number of attempts: 1 Airway Equipment and Method: Stylet and Oral airway Placement Confirmation: ETT inserted through vocal cords under direct vision,  positive ETCO2 and breath sounds checked- equal and bilateral Secured at: 23 cm Tube secured with: Tape Dental Injury: Teeth and Oropharynx as per pre-operative assessment

## 2019-07-29 NOTE — Anesthesia Postprocedure Evaluation (Signed)
Anesthesia Post Note  Patient: [name]  Procedure(s) Performed: LEFT PAROTIDECTOMY (Left Neck)     Patient location during evaluation: PACU Anesthesia Type: General Level of consciousness: awake and alert Pain management: pain level controlled Vital Signs Assessment: post-procedure vital signs reviewed and stable Respiratory status: spontaneous breathing, nonlabored ventilation, respiratory function stable and patient connected to nasal cannula oxygen Cardiovascular status: blood pressure returned to baseline and stable Postop Assessment: no apparent nausea or vomiting Anesthetic complications: no    Last Vitals:  Vitals:   07/29/19 1215 07/29/19 1230  BP: 133/75 (!) 141/82  Pulse: 80 79  Resp: (!) 21 16  Temp:  (!) 35.8 C  SpO2: 98% 97%    Last Pain:  Vitals:   07/29/19 1115  TempSrc:   PainSc: Asleep                 Montez Hageman

## 2019-07-29 NOTE — Anesthesia Preprocedure Evaluation (Signed)
Anesthesia Evaluation  Patient identified by MRN, date of birth, ID band Patient awake    Reviewed: Allergy & Precautions, NPO status , Patient's Chart, lab work & pertinent test results  Airway Mallampati: II  TM Distance: >3 FB Neck ROM: Full    Dental no notable dental hx.    Pulmonary Current Smoker and Patient abstained from smoking.,    Pulmonary exam normal breath sounds clear to auscultation       Cardiovascular hypertension, Pt. on medications negative cardio ROS Normal cardiovascular exam Rhythm:Regular Rate:Normal     Neuro/Psych negative neurological ROS  negative psych ROS   GI/Hepatic negative GI ROS, (+)     substance abuse  alcohol use,   Endo/Other  negative endocrine ROSdiabetes, Poorly Controlled, Type 2  Renal/GU negative Renal ROS  negative genitourinary   Musculoskeletal negative musculoskeletal ROS (+)   Abdominal   Peds negative pediatric ROS (+)  Hematology negative hematology ROS (+)   Anesthesia Other Findings   Reproductive/Obstetrics negative OB ROS                             Anesthesia Physical Anesthesia Plan  ASA: III  Anesthesia Plan: General   Post-op Pain Management:    Induction: Intravenous  PONV Risk Score and Plan: 1 and Ondansetron and Treatment may vary due to age or medical condition  Airway Management Planned: Oral ETT  Additional Equipment:   Intra-op Plan:   Post-operative Plan: Extubation in OR  Informed Consent: I have reviewed the patients History and Physical, chart, labs and discussed the procedure including the risks, benefits and alternatives for the proposed anesthesia with the patient or authorized representative who has indicated his/her understanding and acceptance.     Dental advisory given  Plan Discussed with: CRNA  Anesthesia Plan Comments:         Anesthesia Quick Evaluation

## 2019-07-29 NOTE — Discharge Instructions (Signed)
Parotidectomy, Care After This sheet gives you information about how to care for yourself after your procedure. Your health care provider may also give you more specific instructions. If you have problems or questions, contact your health care provider. What can I expect after the procedure? After the procedure, it is common to have:  Pain and mild swelling at the incision site.  Numbness along the incision.  Numbness in part or all of your ear.  Mild jaw discomfort on the surgical side when you are eating or chewing. This may last up to 2-4 weeks. Follow these instructions at home: Medicines   Take over-the-counter and prescription medicines only as told by your health care provider.  If you were prescribed an antibiotic medicine, take it as told by your health care provider. Do not stop taking the antibiotic even if you start to feel better.  Ask your health care provider if the medicine prescribed to you: ? Requires you to avoid driving or using heavy machinery. ? Can cause constipation. You may need to take actions to prevent or treat constipation, such as:  Drink enough fluid to keep your urine pale yellow.  Take over-the-counter or prescription medicines.  Eat foods that are high in fiber, such as beans, whole grains, and fresh fruits and vegetables.  Limit foods that are high in fat and processed sugars, such as fried or sweet foods. Incision care   Follow instructions from your health care provider about how to take care of your incision. Make sure you: ? Leave stitches (sutures), skin glue, or adhesive strips in place. These skin closures may need to be in place for 2 weeks or longer. If adhesive strip edges start to loosen and curl up, you may trim the loose edges. Do not remove adhesive strips completely unless your health care provider tells you to do that.  Check your incision area every day for signs of infection. Check for: ? More redness, swelling, or  pain. ? Fluid or blood. ? Warmth. ? Pus or a bad smell.  Follow your health care provider's instructions about cleaning and maintaining the drain that was placed near your incision. Eating and drinking  Follow instructions from your health care provider about eating or drinking restrictions.  If your mouth or jaw is sore, try eating soft foods until you feel better. Activity  Return to your normal activities as told by your health care provider. Ask your health care provider what activities are safe for you.  Rest as told by your health care provider.  Avoid sitting for a long time without moving. Get up to take short walks every 1-2 hours. This is important to improve blood flow and breathing. Ask for help if you feel weak or unsteady.  Do not lift anything that is heavier than 10 lb (4.5 kg), or the limit that you are told, until your health care provider says that it is safe. General instructions  Keep your head raised (elevated) when you lie down during the first few weeks after surgery. This will help prevent increased swelling.  Do not take baths, swim, or use a hot tub until your health care provider approves. Ask your health care provider if you may take showers. You may only be allowed to take sponge baths.  Keep all follow-up visits as told by your health care provider. This is important. Contact a health care provider if:  You have pain that does not get better with medicine.  You have more redness, swelling,  or pain around your incision.  You have fluid or blood coming from your incision.  Your incision feels warm to the touch.  You have pus or a bad smell coming from your incision.  You vomit or feel nauseous.  You have a fever. Get help right away if:  You have more pain, swelling, or redness that suddenly gets worse at the incision site.  You have increasing numbness or weakness in your face.  You have severe pain. Summary  After the procedure, it is  common to have mild jaw discomfort on the surgical side when you are eating or chewing. This may last up to 2-4 weeks.  Follow instructions from your health care provider about how to take care of your incision.  If your mouth or jaw is sore, try eating soft foods until you feel better.  Return to your normal activities as told by your health care provider. Ask your health care provider what activities are safe for you. This information is not intended to replace advice given to you by your health care provider. Make sure you discuss any questions you have with your health care provider. Document Released: 10/21/2010 Document Revised: 07/08/2018 Document Reviewed: 07/10/2018 Elsevier Patient Education  2020 [employer].

## 2019-07-29 NOTE — Transfer of Care (Signed)
Immediate Anesthesia Transfer of Care Note  Patient: Dylan Cline  Procedure(s) Performed: LEFT PAROTIDECTOMY (Left Neck)  Patient Location: PACU  Anesthesia Type:General  Level of Consciousness: awake  Airway & Oxygen Therapy: Patient Spontanous Breathing and Patient connected to face mask oxygen  Post-op Assessment: Report given to RN and Post -op Vital signs reviewed and stable  Post vital signs: Reviewed and stable  Last Vitals:  Vitals Value Taken Time  BP 156/84 07/29/19 1107  Temp    Pulse 88 07/29/19 1108  Resp 22 07/29/19 1108  SpO2 100 % 07/29/19 1108  Vitals shown include unvalidated device data.  Last Pain:  Vitals:   07/29/19 0756  TempSrc: Oral  PainSc: 0-No pain      Patients Stated Pain Goal: 3 (35/78/97 8478)  Complications: No apparent anesthesia complications

## 2019-07-29 NOTE — H&P (Signed)
Cc: Left parotid mass  HPI: The patient is a 68 y/o male who presents today for evaluation of a left parotid mass. The patient is seen in consultation requested by [facility]. The patient first noticed a knot behind his left ear in 1993. The area has gradually gotten larger. The patient recently had a neck CT which showed a 4.2 cm left parotid mass. The patient denies any facial weakness or numbness. No dysphagia or odynophagia is noted. The patient denies pain. The patient is a 40+ pack year smoker. Previous ENT surgery is denied.   The patient's review of systems (constitutional, eyes, ENT, cardiovascular, respiratory, GI, musculoskeletal, skin, neurologic, psychiatric, endocrine, hematologic, allergic) is noted in the ROS questionnaire.  It is reviewed with the patient.   Family health history: Diabetes, cancer, heart attack.  Major events: None.  Ongoing medical problems: None.  Social history: The patient is married. He smokes about 10 cigarettes a day. He denies the use of alcohol or illegal drugs.   Exam General: Communicates without difficulty, well nourished, no acute distress. Head: Normocephalic, no evidence injury, no tenderness, facial buttresses intact without stepoff. Eyes: PERRL, EOMI. No scleral icterus, conjunctivae clear. Neuro: CN II exam reveals vision grossly intact.  No nystagmus at any point of gaze. Ears: Auricles well formed without lesions.  Ear canals are intact without mass or lesion.  No erythema or edema is appreciated.  The TMs are intact without fluid. Nose: External evaluation reveals normal support and skin without lesions.  Dorsum is intact.  Anterior rhinoscopy reveals healthy pink mucosa over anterior aspect of inferior turbinates and intact septum.  No purulence noted. Oral:  Oral cavity and oropharynx are intact, symmetric, without erythema or edema.  Mucosa is moist without lesions. Neck: Full range of motion without pain.  There is no significant  lymphadenopathy.  No masses palpable.  Thyroid bed within normal limits to palpation.  Submandibular glands equal bilaterally without mass. Large left parotid mass.  Trachea is midline. Neuro:  CN 2-12 grossly intact. Gait normal. Vestibular: No nystagmus at any point of gaze. The cerebellar examination is unremarkable.   Assessment 1. The patient is noted to have a 4.2 cm left parotid mass. Facial nerve is intact.   Plan  1. The physical exam and CT images are extensively reviewed with the patient.  2. In light of the size of the mass, recommend left parotidectomy. The risks, benefits, alternatives, and details of the procedure are reviewed with the patient. Questions are invited and answered. 3. The patient is interested in proceeding with the procedure.  We will schedule the procedure in accordance with the family schedule.

## 2019-07-29 NOTE — Op Note (Signed)
DATE OF PROCEDURE:  07/29/2019                              OPERATIVE REPORT  SURGEON:  Leta Baptist, MD  PREOPERATIVE DIAGNOSES: 1. Left parotid mass  POSTOPERATIVE DIAGNOSES: 1. Left parotid mass  PROCEDURE PERFORMED:  Left lateral parotidectomy with facial nerve dissection  ANESTHESIA:  General endotracheal tube anesthesia.  COMPLICATIONS:  None.  ESTIMATED BLOOD LOSS:  164ml.  INDICATION FOR PROCEDURE:  Dylan Cline is a 68 y.o. male with a history of an enlarging left parotid mass. His neck CT scan showed a large 4.2cm left parotid mass.  Based on the above findings, the decision was made for the patient to undergo the above stated procedure.  The risks, benefits, alternatives, and details of the procedure were discussed with the patient.  Questions were invited and answered.  Informed consent was obtained.  DESCRIPTION:  The patient was taken to the operating room and placed supine on the operating table.  General endotracheal tube anesthesia was administered by the anesthesiologist.  The patient was positioned and prepped and draped in a standard fashion for left parotidectomy surgery.  The facial nerve monitoring electrodes were placed.  The facial nerve monitoring system was functional throughout the case.  1% lidocaine with 1-100,000 epinephrine was infiltrated at the planned site of incision.  A curvilinear facelift incision was made on the left side. The incision was carried down to the level of the SMAS. The SMAS skin flap was elevated in the standard fashion. The large 4.2 cm parotid mass was noted. Dissection was carried out anterior to the auricular cartilage. The main trunk and the branches of the facial nerve were identified and preserved. The tumor was inferior to the nerve. The facial nerve branches were dissected free from the tumor. The entire tumor was removed and sent to the pathology department. The facial nerve was functional at the end of the case.  A #10 JP drain  was placed. The surgical site was copiously irrigated. The incision was closed in layers with 4-0 vicryl, 5-0 prolene, and dermabond.  The care of the patient was turned over to the anesthesiologist.  The patient was awakened from anesthesia without difficulty.  The patient was extubated and transferred to the recovery room in good condition.  OPERATIVE FINDINGS:  A 4.2 cm left parotid mass.  SPECIMEN:  Left parotid mass.  FOLLOWUP CARE:  The patient will be admitted for overnight observation.  Sierra Bissonette W Lennyx Verdell 07/29/2019 11:01 AM

## 2019-07-30 ENCOUNTER — Encounter (HOSPITAL_BASED_OUTPATIENT_CLINIC_OR_DEPARTMENT_OTHER): Payer: Self-pay | Admitting: Otolaryngology

## 2019-07-30 DIAGNOSIS — D110 Benign neoplasm of parotid gland: Secondary | ICD-10-CM | POA: Diagnosis not present

## 2019-07-30 LAB — SURGICAL PATHOLOGY

## 2019-07-30 LAB — GLUCOSE, CAPILLARY
Glucose-Capillary: 248 mg/dL — ABNORMAL HIGH (ref 70–99)
Glucose-Capillary: 275 mg/dL — ABNORMAL HIGH (ref 70–99)

## 2019-07-30 MED ORDER — ONDANSETRON HCL 4 MG/2ML IJ SOLN
4.0000 mg | Freq: Four times a day (QID) | INTRAMUSCULAR | Status: DC
  Filled 2019-07-30: qty 2

## 2019-07-30 MED ORDER — ONDANSETRON HCL 4 MG/2ML IJ SOLN
4.0000 mg | Freq: Four times a day (QID) | INTRAMUSCULAR | Status: DC | PRN
  Administered 2019-07-30: 4 mg via INTRAVENOUS

## 2019-07-30 NOTE — Discharge Summary (Signed)
Physician Discharge Summary  Patient ID: Dylan Cline MRN: [id number] DOB/AGE: Feb 16, 1951 68 y.o.  Admit date: 07/29/2019 Discharge date: 07/30/2019  Admission Diagnoses: Left parotid mass  Discharge Diagnoses: Left parotid mass Active Problems:   H/O parotidectomy   Discharged Condition: good  Hospital Course: Pt had an uneventful overnight stay. Pt tolerated po well. No bleeding. No stridor. Facial nerve intact.  Consults: None  Significant Diagnostic Studies: None  Treatments: surgery: Left parotidectomy  Discharge Exam: Blood pressure (!) 142/80, pulse 66, temperature (!) 97 F (36.1 C), resp. rate 16, height 5\' 9"  (1.753 m), weight 75.8 kg, SpO2 98 %. Incision/Wound:c/d/i Facial nerve intact.  Disposition: Discharge disposition: 01-Home or Self Care       Discharge Instructions    Activity as tolerated - No restrictions   Complete by: As directed    Diet general   Complete by: As directed      Allergies as of 07/30/2019   No Known Allergies     Medication List    TAKE these medications   amLODipine 2.5 MG tablet Commonly known as: NORVASC Take 1 tablet (2.5 mg total) by mouth daily.   amoxicillin 875 MG tablet Commonly known as: AMOXIL Take 1 tablet (875 mg total) by mouth 2 (two) times daily for 3 days.   atorvastatin 40 MG tablet Commonly known as: LIPITOR Take 40 mg by mouth daily.   hydrochlorothiazide 12.5 MG capsule Commonly known as: MICROZIDE Take 12.5 mg by mouth daily.   omeprazole 40 MG capsule Commonly known as: PRILOSEC Take 1 capsule (40 mg total) by mouth 2 (two) times daily.   oxyCODONE-acetaminophen 5-325 MG tablet Commonly known as: Percocet Take 1 tablet by mouth every 4 (four) hours as needed for up to 5 days for severe pain.      Follow-up Information    Leta Baptist, MD On 08/04/2019.   Specialty: Otolaryngology Why: at [employer] information: [address] [place] [address] [phone]            Signed: Burley Saver 07/30/2019, 7:05 AM

## 2019-08-04 ENCOUNTER — Ambulatory Visit (INDEPENDENT_AMBULATORY_CARE_PROVIDER_SITE_OTHER): Payer: Medicare HMO | Admitting: Otolaryngology

## 2019-09-03 ENCOUNTER — Other Ambulatory Visit: Payer: Self-pay

## 2019-09-03 ENCOUNTER — Ambulatory Visit (INDEPENDENT_AMBULATORY_CARE_PROVIDER_SITE_OTHER): Payer: Medicare HMO | Admitting: Nurse Practitioner

## 2019-09-03 ENCOUNTER — Encounter: Payer: Self-pay | Admitting: Nurse Practitioner

## 2019-09-03 VITALS — BP 151/88 | HR 79 | Temp 96.8°F | Ht 69.0 in | Wt 171.2 lb

## 2019-09-03 DIAGNOSIS — R634 Abnormal weight loss: Secondary | ICD-10-CM

## 2019-09-03 DIAGNOSIS — R1033 Periumbilical pain: Secondary | ICD-10-CM

## 2019-09-03 DIAGNOSIS — K219 Gastro-esophageal reflux disease without esophagitis: Secondary | ICD-10-CM | POA: Diagnosis not present

## 2019-09-03 MED ORDER — OMEPRAZOLE 40 MG PO CPDR: 40.0000 mg | DELAYED_RELEASE_CAPSULE | Freq: Two times a day (BID) | ORAL | 3 refills | Status: DC

## 2019-09-03 NOTE — Progress Notes (Signed)
Referring Provider: Wendie Simmer, MD Primary Care Physician:  Medicine, [facility] Primary GI:  Dr. Gala Romney  Chief Complaint  Patient presents with  . Gastroesophageal Reflux    f/u    HPI:   Dylan Cline is a 68 y.o. male who presents for follow-up on GERD and abdominal pain.  The patient was last seen in our office 06/02/2019 for upper abdominal pain, GERD, weight loss.  History of alcohol induced pancreatitis, weight loss, abdominal pain, GERD.  Last EGD in 2006 for epigastric pain, nausea, vomiting, and abnormal CT of the abdomen suggesting pancreatic mass.  Findings included normal esophagus, submucosal petechial hemorrhages likely as a consequence of vomiting and trauma.  Recommended CA 19-9 and if elevated proceed to FNA CT-guided biopsy.  No CA 19-9 results can be found.  Colonoscopy also completed in 2016 essentially normal but marginal prep and recommended Anusol and follow-up CT pancreatic protocol.  Also noted history of pancreatic pseudocyst and did eventually undergo FNA at [facility] and repeat CT of the abdomen found decrease size and pancreatic pseudocyst, no new cysts or discrete solid pancreatic masses.  Most recent CT imaging in 2014 found changes of acute pancreatitis involving the head of the pancreas, further decrease in size of the and small post pancreatitis fluid collections in the distal body and proximal tail.  Previously with remote PUD and GERD symptoms and no improvement with Omeprazole after 2 days.  At a recent visit he admitted ongoing Excedrin use.  Drinks a pint to a pint and a half of wine a day.  Updated labs found normal CBC, CMP with critical potassium at 6.5 and acute kidney failure with creatinine of 5.03 and elevated lipase 147.  He was referred to the emergency department and subsequently admitted from 04/29/2019 through 05/05/2019 for acute on chronic renal failure.  Serum creatinine was at baseline of 2.39 on day of  discharge.  Epigastric pain improved with PPI.  Recommended omeprazole twice daily for a month and then once daily indefinitely.  Noted left submandibular mass which the patient notes has been there for 6 years without change.  CT of the neck and soft tissues found suspected left parotid neoplasm and recommended follow-up with ENT.  At his last visit GERD was improved on omeprazole daily without breakthrough, weight stable, abdominal pain improved/no pain currently.  ENT appointment upcoming.  Sees nephrology and they told him he is doing okay.  No other overt GI symptoms.  Stopped taking Excedrin.  Recommended continue Prilosec, avoid all NSAIDs, follow-up with ENT and renal, follow-up in 3 months.  It appears the patient underwent a left lateral parotidectomy with facial nerve dissection for a left parotid mass on 07/29/2019.  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  Today he states he's doing well. Surgery went well, no major complications. GERD doing well on PPI. States occasionally he will have a rapid heart rate and chest pain, has not notified PCP. Denies abdominal pain, N/V, hematochezia, melena, fever, chills, unintentional weight loss. Denies URI or flu-like symptoms. Denies loss of sense of taste or smell. Denies chest pain, dyspnea, dizziness, lightheadedness, syncope, near syncope. Denies any other upper or lower GI symptoms.  Past Medical History:  Diagnosis Date  . Acute type 2 diabetes mellitus with manifestations (HCC)    no meds currently  . CRI (chronic renal insufficiency), stage 3 (moderate)   . GERD (gastroesophageal reflux disease)   .  Hypertension   . Mass of left parotid gland   . Pancreatitis   . Pancreatitis, alcoholic, acute 25/06/5637  . Smoker     Past Surgical History:  Procedure Laterality Date  . COLONOSCOPY   01/27/2005   VFI:EPPIRJJO hemorrhoids, otherwise normal rectum/Normal colon, marginal prep made the exam  more difficult  . ESOPHAGOGASTRODUODENOSCOPY   10/12/2004   ACZ:YSAYTK esophagus/Focal area of submucosal petechial hemorrhage in the fundus (likely a trauma-induced phenomenon secondary to vomiting), otherwise normal  . LAPAROSCOPIC GASTROTOMY W/ REPAIR OF ULCER    . PAROTIDECTOMY Left 07/29/2019   Procedure: LEFT PAROTIDECTOMY;  Surgeon: Leta Baptist, MD;  Location: [facility];  Service: ENT;  Laterality: Left;    Current Outpatient Medications  Medication Sig Dispense Refill  . amLODipine (NORVASC) 2.5 MG tablet Take 1 tablet (2.5 mg total) by mouth daily. 30 tablet 1  . hydrochlorothiazide (MICROZIDE) 12.5 MG capsule Take 12.5 mg by mouth daily.    Marland Kitchen omeprazole (PRILOSEC) 40 MG capsule Take 1 capsule (40 mg total) by mouth 2 (two) times daily. 60 capsule 1  . atorvastatin (LIPITOR) 40 MG tablet Take 40 mg by mouth daily.     No current facility-administered medications for this visit.     Allergies as of 09/03/2019  . (No Known Allergies)    Family History  Problem Relation Age of Onset  . Diabetes Mother   . Diabetes Father   . Heart attack Maternal Grandfather   . Cancer Paternal Grandmother   . Heart attack Maternal Uncle   . Cancer Maternal Aunt   . Cancer Maternal Uncle   . Colon cancer Neg Hx   . Pancreatic cancer Neg Hx   . Stomach cancer Neg Hx     Social History   Socioeconomic History  . Marital status: Married    Spouse name: Not on file  . Number of children: Not on file  . Years of education: Not on file  . Highest education level: Not on file  Occupational History  . Not on file  Social Needs  . Financial resource strain: Not on file  . Food insecurity    Worry: Not on file    Inability: Not on file  . Transportation needs    Medical: Not on file    Non-medical: Not on file  Tobacco Use  . Smoking status: Current Every Day Smoker    Packs/day: 1.00    Years: 40.00    Pack years: 40.00    Types: Cigarettes    Start date: 12/01/1970   . Smokeless tobacco: Never Used  Substance and Sexual Activity  . Alcohol use: Yes    Alcohol/week: 0.0 standard drinks    Comment: 09/03/19: "little", once or twice a month  . Drug use: No  . Sexual activity: Not on file  Lifestyle  . Physical activity    Days per week: Not on file    Minutes per session: Not on file  . Stress: Not on file  Relationships  . Social Herbalist on phone: Not on file    Gets together: Not on file    Attends religious service: Not on file    Active member of club or organization: Not on file    Attends meetings of clubs or organizations: Not on file    Relationship status: Not on file  Other Topics Concern  . Not on file  Social History Narrative  . Not on file    Review  of Systems: General: Negative for anorexia, weight loss, fever, chills, fatigue, weakness. ENT: Negative for hoarseness, difficulty swallowing. CV: Negative for chest pain, angina, palpitations, peripheral edema.  Respiratory: Negative for dyspnea at rest, cough, sputum, wheezing.  GI: See history of present illness. Endo: Negative for unusual weight change.  Heme: Negative for bruising or bleeding. Allergy: Negative for rash or hives.   Physical Exam: BP (!) 151/88   Pulse 79   Temp (!) 96.8 F (36 C) (Temporal)   Ht 5\' 9"  (1.753 m)   Wt 171 lb 3.2 oz (77.7 kg)   BMI 25.28 kg/m  General:   Alert and oriented. Pleasant and cooperative. Well-nourished and well-developed.  Eyes:  Without icterus, sclera clear and conjunctiva pink.  Ears:  Normal auditory acuity. Throat/Neck:  Left-sided surgical scar noted, appears well healed. Cardiovascular:  S1, S2 present with frequent PACs. No murmurs appreciated. Extremities without clubbing or edema. Respiratory:  Clear to auscultation bilaterally. No wheezes, rales, or rhonchi. No distress.  Gastrointestinal:  +BS, soft, non-tender and non-distended. No HSM noted. No guarding or rebound. No masses appreciated.   Rectal:  Deferred  Musculoskalatal:  Symmetrical without gross deformities. Neurologic:  Alert and oriented x4;  grossly normal neurologically. Psych:  Alert and cooperative. Normal mood and affect. Heme/Lymph/Immune: No excessive bruising noted.    09/03/2019 2:56 PM   Disclaimer: This note was dictated with voice recognition software. Similar sounding words can inadvertently be transcribed and may not be corrected upon review.

## 2019-09-03 NOTE — Assessment & Plan Note (Signed)
GERD currently doing well on PPI, no frequent breakthrough noted.  Recommend he continue his PPI.  He has done well over the past 6 months.  Follow-up in 1 year.  Call for any worsening or severe symptoms.

## 2019-09-03 NOTE — Patient Instructions (Addendum)
Your health issues we discussed today were:   GERD (reflux/heartburn) with abdominal pain: 1. I am glad your abdominal pain is better 2. Continue taking Prilosec (omeprazole) twice daily 3. I have sent a refill of your omeprazole to your pharmacy 4. Call us if you have any worsening or severe symptoms  Overall I recommend:  1. Continue your other current medications 2. Return for follow-up in 1 year 3. Call us if you have any questions or concerns.   Because of recent events of COVID-19 ("Coronavirus"), follow CDC recommendations:  1. Wash your hand frequently 2. Avoid touching your face 3. Stay away from people who are sick 4. If you have symptoms such as fever, cough, shortness of breath then call your healthcare provider for further guidance 5. If you are sick, STAY AT HOME unless otherwise directed by your healthcare provider. 6. Follow directions from state and national officials regarding staying safe   At [facility] we value your feedback. You may receive a survey about your visit today. Please share your experience as we strive to create trusting relationships with our patients to provide genuine, compassionate, quality care.  We appreciate your understanding and patience as we review any laboratory studies, imaging, and other diagnostic tests that are ordered as we care for you. Our office policy is 5 business days for review of these results, and any emergent or urgent results are addressed in a timely manner for your best interest. If you do not hear from our office in 1 week, please contact us.   We also encourage the use of MyChart, which contains your medical information for your review as well. If you are not enrolled in this feature, an access code is on this after visit summary for your convenience. Thank you for allowing [place] to be involved in your care.  It was great to see you today!  I hope you have a Merry Christmas and Happy Holidays!!

## 2019-09-03 NOTE — Assessment & Plan Note (Signed)
Abdominal pain essentially resolved at this time.  Continue to monitor and notify us of any worsening symptoms.  Return for follow-up in 1 year.

## 2019-09-04 ENCOUNTER — Telehealth: Payer: Self-pay | Admitting: Internal Medicine

## 2019-09-05 NOTE — Telephone Encounter (Signed)
Error

## 2019-11-13 NOTE — Patient Instructions (Signed)
Dylan Cline  11/13/2019     @PREFPERIOPPHARMACY @   Your procedure is scheduled on 11/24/2019  Report to [facility] at  11/24/2019  A.M.  Call this number if you have problems the morning of surgery:  [phone]   Remember:  Do not eat or drink after midnight.                       Take these medicines the morning of surgery with A SIP OF WATER amlodipine, prilosec. DO NOT take any medication for diabetes the morning of your surgery.    Do not wear jewelry, make-up or nail polish.  Do not wear lotions, powders, or perfume. Please wear deodorant and brush your teeth.  Do not shave 48 hours prior to surgery.  Men may shave face and neck.  Do not bring valuables to the hospital.  [facility] is not responsible for any belongings or valuables.  Contacts, dentures or bridgework may not be worn into surgery.  Leave your suitcase in the car.  After surgery it may be brought to your room.  For patients admitted to the hospital, discharge time will be determined by your treatment team.  Patients discharged the day of surgery will not be allowed to drive home.   Name and phone number of your driver:   family Special instructions:  DO NOT smoke the day of your surgery.  Please read over the following fact sheets that you were given. Anesthesia Post-op Instructions and Care and Recovery After Surgery       Cataract Surgery, Care After This sheet gives you information about how to care for yourself after your procedure. Your health care provider may also give you more specific instructions. If you have problems or questions, contact your health care provider. What can I expect after the procedure? After the procedure, it is common to have:  Itching.  Discomfort.  Fluid discharge.  Sensitivity to light and to touch.  Bruising in or around the eye.  Mild blurred vision. Follow these instructions at home: Eye care   Do not touch or rub your eyes.  Protect your  eyes as told by your health care provider. You may be told to wear a protective eye shield or sunglasses.  Do not put a contact lens into the affected eye or eyes until your health care provider approves.  Keep the area around your eye clean and dry: ? Avoid swimming. ? Do not allow water to hit you directly in the face while showering. ? Keep soap and shampoo out of your eyes.  Check your eye every day for signs of infection. Watch for: ? Redness, swelling, or pain. ? Fluid, blood, or pus. ? Warmth. ? A bad smell. ? Vision that is getting worse. ? Sensitivity that is getting worse. Activity  Do not drive for 24 hours if you were given a sedative during your procedure.  Avoid strenuous activities, such as playing contact sports, for as long as told by your health care provider.  Do not drive or use heavy machinery until your health care provider approves.  Do not bend or lift heavy objects. Bending increases pressure in the eye. You can walk, climb stairs, and do light household chores.  Ask your health care provider when you can return to work. If you work in a dusty environment, you may be advised to wear protective eyewear for a period of time. General instructions  Take or apply over-the-counter and prescription medicines only as told by your health care provider. This includes eye drops.  Keep all follow-up visits as told by your health care provider. This is important. Contact a health care provider if:  You have increased bruising around your eye.  You have pain that is not helped with medicine.  You have a fever.  You have redness, swelling, or pain in your eye.  You have fluid, blood, or pus coming from your incision.  Your vision gets worse.  Your sensitivity to light gets worse. Get help right away if:  You have sudden loss of vision.  You see flashes of light or spots (floaters).  You have severe eye pain.  You develop nausea or  vomiting. Summary  After your procedure, it is common to have itching, discomfort, bruising, fluid discharge, or sensitivity to light.  Follow instructions from your health care provider about caring for your eye after the procedure.  Do not rub your eye after the procedure. You may need to wear eye protection or sunglasses. Do not wear contact lenses. Keep the area around your eye clean and dry.  Avoid activities that require a lot of effort. These include playing sports and lifting heavy objects.  Contact a health care provider if you have increased bruising, pain that does not go away, or a fever. Get help right away if you suddenly lose your vision, see flashes of light or spots, or have severe pain in the eye. This information is not intended to replace advice given to you by your health care provider. Make sure you discuss any questions you have with your health care provider. Document Revised: 07/15/2019 Document Reviewed: 03/18/2018 Elsevier Patient Education  2020 [place] After These instructions provide you with information about caring for yourself after your procedure. Your health care provider may also give you more specific instructions. Your treatment has been planned according to current medical practices, but problems sometimes occur. Call your health care provider if you have any problems or questions after your procedure. What can I expect after the procedure? After your procedure, you may:  Feel sleepy for several hours.  Feel clumsy and have poor balance for several hours.  Feel forgetful about what happened after the procedure.  Have poor judgment for several hours.  Feel nauseous or vomit.  Have a sore throat if you had a breathing tube during the procedure. Follow these instructions at home: For at least 24 hours after the procedure:      Have a responsible adult stay with you. It is important to have someone help  care for you until you are awake and alert.  Rest as needed.  Do not: ? Participate in activities in which you could fall or become injured. ? Drive. ? Use heavy machinery. ? Drink alcohol. ? Take sleeping pills or medicines that cause drowsiness. ? Make important decisions or sign legal documents. ? Take care of children on your own. Eating and drinking  Follow the diet that is recommended by your health care provider.  If you vomit, drink water, juice, or soup when you can drink without vomiting.  Make sure you have little or no nausea before eating solid foods. General instructions  Take over-the-counter and prescription medicines only as told by your health care provider.  If you have sleep apnea, surgery and certain medicines can increase your risk for breathing problems. Follow instructions from your health care provider about wearing  your sleep device: ? Anytime you are sleeping, including during daytime naps. ? While taking prescription pain medicines, sleeping medicines, or medicines that make you drowsy.  If you smoke, do not smoke without supervision.  Keep all follow-up visits as told by your health care provider. This is important. Contact a health care provider if:  You keep feeling nauseous or you keep vomiting.  You feel light-headed.  You develop a rash.  You have a fever. Get help right away if:  You have trouble breathing. Summary  For several hours after your procedure, you may feel sleepy and have poor judgment.  Have a responsible adult stay with you for at least 24 hours or until you are awake and alert. This information is not intended to replace advice given to you by your health care provider. Make sure you discuss any questions you have with your health care provider. Document Revised: 12/17/2017 Document Reviewed: 01/09/2016 Elsevier Patient Education  [place].

## 2019-11-18 NOTE — H&P (Signed)
Surgical History & Physical  Patient Name: Dylan Cline DOB: 1951/04/04  Surgery: Cataract extraction with intraocular lens implant phacoemulsification; Right Eye  Surgeon: [name] MD Surgery Date:  11/24/2019 Pre-Op Date:  11/03/2019  HPI: A 48 Yr. old male patient 1. 1. The patient complains of difficulty when reading fine print, books, newspaper, instructions etc., which began 1 year ago. Both eyes are affected. The episode is gradual. The condition's severity increased since last visit. Symptoms occur when the patient is inside, outside and reading. The complaint is associated with glare. This is negatively affecting the patient's quality of life. HPI was performed by [name] .  Medical History: Cataracts Diabetes High Blood Pressure LDL  Review of Systems Negative Allergic/Immunologic Negative Cardiovascular Negative Constitutional Negative Ear, Nose, Mouth & Throat Negative Endocrine Negative Eyes Negative Gastrointestinal Negative Genitourinary Negative Hemotologic/Lymphatic Negative Integumentary Negative Musculoskeletal Negative Neurological Negative Psychiatry Negative Respiratory  Social   Current every day smoker    Medication Amlodipine Besylate, Atorvastatin, HCTZ, Januvia, Levemir,   Sx/Procedures Neck cyst removal,   Drug Allergies   NKDA  History & Physical: Heent:  Cataract, Right eye NECK: supple without bruits LUNGS: lungs clear to auscultation CV: regular rate and rhythm Abdomen: soft and non-tender  Impression & Plan: Assessment: 1.  COMBINED FORMS AGE RELATED CATARACT; Both Eyes (H25.813)  Plan: 1.  Cataract accounts for the patient's decreased vision. This visual impairment is not correctable with a tolerable change in glasses or contact lenses. Cataract surgery with an implantation of a new lens should significantly improve the visual and functional status of the patient. Discussed all risks, benefits, alternatives, and  potential complications. Discussed the procedures and recovery. Patient desires to have surgery. A-scan ordered and performed today for intra-ocular lens calculations. The surgery will be performed in order to improve vision for driving, reading, and for eye examinations. Recommend phacoemulsification with intra-ocular lens. Right Eye worse - first. Dilates poorly - shugacaine by protocol. Malyugin Ring in room. Omidria. Consider Symfony lens.

## 2019-11-19 ENCOUNTER — Encounter (HOSPITAL_COMMUNITY)
Admission: RE | Admit: 2019-11-19 | Discharge: 2019-11-19 | Disposition: A | Discharge status: Home / Self Care | Payer: Medicare HMO | Source: Ambulatory Visit | Attending: Ophthalmology | Admitting: Ophthalmology

## 2019-11-19 ENCOUNTER — Other Ambulatory Visit: Payer: Self-pay

## 2019-11-19 DIAGNOSIS — I10 Essential (primary) hypertension: Secondary | ICD-10-CM | POA: Diagnosis not present

## 2019-11-19 DIAGNOSIS — Z01812 Encounter for preprocedural laboratory examination: Secondary | ICD-10-CM | POA: Diagnosis present

## 2019-11-19 DIAGNOSIS — Z79899 Other long term (current) drug therapy: Secondary | ICD-10-CM | POA: Diagnosis not present

## 2019-11-19 DIAGNOSIS — H25813 Combined forms of age-related cataract, bilateral: Secondary | ICD-10-CM | POA: Diagnosis not present

## 2019-11-19 LAB — BASIC METABOLIC PANEL
Anion gap: 9 (ref 5–15)
BUN: 20 mg/dL (ref 8–23)
CO2: 23 mmol/L (ref 22–32)
Calcium: 8.5 mg/dL — ABNORMAL LOW (ref 8.9–10.3)
Chloride: 108 mmol/L (ref 98–111)
Creatinine, Ser: 2.46 mg/dL — ABNORMAL HIGH (ref 0.61–1.24)
GFR calc Af Amer: 30 mL/min — ABNORMAL LOW (ref >60)
GFR calc non Af Amer: 26 mL/min — ABNORMAL LOW (ref >60)
Glucose, Bld: 152 mg/dL — ABNORMAL HIGH (ref 70–99)
Potassium: 3.8 mmol/L (ref 3.5–5.1)
Sodium: 140 mmol/L (ref 135–145)

## 2019-11-19 LAB — HEMOGLOBIN A1C
Hgb A1c MFr Bld: 8.1 % — ABNORMAL HIGH (ref 4.8–5.6)
Mean Plasma Glucose: 185.77 mg/dL

## 2019-11-19 NOTE — Progress Notes (Signed)
Instructed patient to take only 1/2 dose of levemir  8.5 units night before surgery

## 2019-11-20 ENCOUNTER — Other Ambulatory Visit (HOSPITAL_COMMUNITY): Admission: RE | Admit: 2019-11-20 | Payer: Medicare HMO | Source: Ambulatory Visit

## 2019-11-20 ENCOUNTER — Encounter (HOSPITAL_COMMUNITY)
Admission: RE | Admit: 2019-11-20 | Discharge: 2019-11-20 | Disposition: A | Discharge status: Home / Self Care | Payer: Medicare HMO | Source: Ambulatory Visit | Attending: Ophthalmology | Admitting: Ophthalmology

## 2019-11-21 ENCOUNTER — Other Ambulatory Visit (HOSPITAL_COMMUNITY)
Admission: RE | Admit: 2019-11-21 | Discharge: 2019-11-21 | Disposition: A | Discharge status: Home / Self Care | Payer: Medicare HMO | Source: Ambulatory Visit | Attending: Ophthalmology | Admitting: Ophthalmology

## 2019-11-21 ENCOUNTER — Other Ambulatory Visit: Payer: Self-pay

## 2019-11-21 DIAGNOSIS — Z20822 Contact with and (suspected) exposure to covid-19: Secondary | ICD-10-CM | POA: Diagnosis not present

## 2019-11-21 DIAGNOSIS — Z01812 Encounter for preprocedural laboratory examination: Secondary | ICD-10-CM | POA: Insufficient documentation

## 2019-11-21 LAB — SARS CORONAVIRUS 2 (TAT 6-24 HRS): SARS Coronavirus 2: NEGATIVE

## 2019-11-24 ENCOUNTER — Ambulatory Visit (HOSPITAL_COMMUNITY): Payer: Medicare HMO | Admitting: Anesthesiology

## 2019-11-24 ENCOUNTER — Ambulatory Visit (HOSPITAL_COMMUNITY)
Admission: RE | Admit: 2019-11-24 | Discharge: 2019-11-24 | Disposition: A | Discharge status: Home / Self Care | Payer: Medicare HMO | Attending: Ophthalmology | Admitting: Ophthalmology

## 2019-11-24 ENCOUNTER — Encounter (HOSPITAL_COMMUNITY)
Admission: RE | Disposition: A | Discharge status: Home / Self Care | Payer: Self-pay | Source: Home / Self Care | Attending: Ophthalmology

## 2019-11-24 ENCOUNTER — Encounter (HOSPITAL_COMMUNITY): Payer: Self-pay | Admitting: Ophthalmology

## 2019-11-24 DIAGNOSIS — Z79899 Other long term (current) drug therapy: Secondary | ICD-10-CM | POA: Diagnosis not present

## 2019-11-24 DIAGNOSIS — E1136 Type 2 diabetes mellitus with diabetic cataract: Secondary | ICD-10-CM | POA: Diagnosis present

## 2019-11-24 DIAGNOSIS — Z794 Long term (current) use of insulin: Secondary | ICD-10-CM | POA: Diagnosis not present

## 2019-11-24 DIAGNOSIS — F17200 Nicotine dependence, unspecified, uncomplicated: Secondary | ICD-10-CM | POA: Diagnosis not present

## 2019-11-24 DIAGNOSIS — I10 Essential (primary) hypertension: Secondary | ICD-10-CM | POA: Insufficient documentation

## 2019-11-24 DIAGNOSIS — H2511 Age-related nuclear cataract, right eye: Secondary | ICD-10-CM | POA: Diagnosis not present

## 2019-11-24 HISTORY — PX: CATARACT EXTRACTION W/PHACO: SHX586

## 2019-11-24 LAB — GLUCOSE, CAPILLARY: Glucose-Capillary: 149 mg/dL — ABNORMAL HIGH (ref 70–99)

## 2019-11-24 SURGERY — PHACOEMULSIFICATION, CATARACT, WITH IOL INSERTION
Anesthesia: Monitor Anesthesia Care | Site: Eye | Laterality: Right

## 2019-11-24 MED ORDER — CYCLOPENTOLATE-PHENYLEPHRINE 0.2-1 % OP SOLN
1.0000 [drp] | OPHTHALMIC | Status: AC | PRN
  Administered 2019-11-24 (×3): 1 [drp] via OPHTHALMIC

## 2019-11-24 MED ORDER — LIDOCAINE HCL (PF) 1 % IJ SOLN
INTRAOCULAR | Status: DC | PRN
  Administered 2019-11-24: 1 mL via OPHTHALMIC

## 2019-11-24 MED ORDER — BSS IO SOLN
INTRAOCULAR | Status: DC | PRN
  Administered 2019-11-24: 15 mL via INTRAOCULAR

## 2019-11-24 MED ORDER — LIDOCAINE HCL 3.5 % OP GEL
1.0000 "application " | Freq: Once | OPHTHALMIC | Status: AC
  Administered 2019-11-24: 1 via OPHTHALMIC

## 2019-11-24 MED ORDER — MIDAZOLAM HCL 2 MG/2ML IJ SOLN
INTRAMUSCULAR | Status: AC
  Filled 2019-11-24: qty 2

## 2019-11-24 MED ORDER — PROVISC 10 MG/ML IO SOLN
INTRAOCULAR | Status: DC | PRN
  Administered 2019-11-24: 0.85 mL via INTRAOCULAR

## 2019-11-24 MED ORDER — HYDROMORPHONE HCL 1 MG/ML IJ SOLN: 0.2500 mg | INTRAMUSCULAR | Status: DC | PRN

## 2019-11-24 MED ORDER — PROMETHAZINE HCL 25 MG/ML IJ SOLN: 6.2500 mg | INTRAMUSCULAR | Status: DC | PRN

## 2019-11-24 MED ORDER — MIDAZOLAM HCL 2 MG/2ML IJ SOLN: 0.5000 mg | Freq: Once | INTRAMUSCULAR | Status: DC | PRN

## 2019-11-24 MED ORDER — NEOMYCIN-POLYMYXIN-DEXAMETH 3.5-10000-0.1 OP SUSP
OPHTHALMIC | Status: DC | PRN
  Administered 2019-11-24: 1 [drp] via OPHTHALMIC

## 2019-11-24 MED ORDER — HYDROCODONE-ACETAMINOPHEN 7.5-325 MG PO TABS: 1.0000 | ORAL_TABLET | Freq: Once | ORAL | Status: DC | PRN

## 2019-11-24 MED ORDER — POVIDONE-IODINE 5 % OP SOLN
OPHTHALMIC | Status: DC | PRN
  Administered 2019-11-24: 1 via OPHTHALMIC

## 2019-11-24 MED ORDER — PHENYLEPHRINE HCL 2.5 % OP SOLN
1.0000 [drp] | OPHTHALMIC | Status: AC | PRN
  Administered 2019-11-24 (×3): 1 [drp] via OPHTHALMIC

## 2019-11-24 MED ORDER — SODIUM HYALURONATE 23 MG/ML IO SOLN
INTRAOCULAR | Status: DC | PRN
  Administered 2019-11-24: 0.6 mL via INTRAOCULAR

## 2019-11-24 MED ORDER — EPINEPHRINE PF 1 MG/ML IJ SOLN
INTRAOCULAR | Status: DC | PRN
  Administered 2019-11-24: 09:00:00 500 mL

## 2019-11-24 MED ORDER — MIDAZOLAM HCL 5 MG/5ML IJ SOLN
INTRAMUSCULAR | Status: DC | PRN
  Administered 2019-11-24: 1 mg via INTRAVENOUS

## 2019-11-24 MED ORDER — TETRACAINE HCL 0.5 % OP SOLN
1.0000 [drp] | OPHTHALMIC | Status: AC | PRN
  Administered 2019-11-24 (×3): 1 [drp] via OPHTHALMIC

## 2019-11-24 SURGICAL SUPPLY — 12 items

## 2019-11-24 NOTE — Anesthesia Preprocedure Evaluation (Signed)
Anesthesia Evaluation  Patient identified by MRN, date of birth, ID band Patient awake    Reviewed: Allergy & Precautions, NPO status , Patient's Chart, lab work & pertinent test results  Airway Mallampati: II  TM Distance: >3 FB Neck ROM: Full    Dental no notable dental hx. (+) Missing   Pulmonary neg pulmonary ROS, Current Smoker and Patient abstained from smoking.,    Pulmonary exam normal breath sounds clear to auscultation       Cardiovascular Exercise Tolerance: Good hypertension, Pt. on medications negative cardio ROS Normal cardiovascular examI Rhythm:Regular Rate:Normal     Neuro/Psych negative neurological ROS  negative psych ROS   GI/Hepatic Neg liver ROS, GERD  ,  Endo/Other  negative endocrine ROSdiabetes  Renal/GU Renal disease  negative genitourinary   Musculoskeletal negative musculoskeletal ROS (+)   Abdominal   Peds negative pediatric ROS (+)  Hematology negative hematology ROS (+)   Anesthesia Other Findings H/o Etoh /pancreatitis  Reproductive/Obstetrics negative OB ROS                             Anesthesia Physical Anesthesia Plan  ASA: III  Anesthesia Plan: MAC   Post-op Pain Management:    Induction: Intravenous  PONV Risk Score and Plan: 0 and TIVA  Airway Management Planned: Nasal Cannula and Simple Face Mask  Additional Equipment:   Intra-op Plan:   Post-operative Plan:   Informed Consent: I have reviewed the patients History and Physical, chart, labs and discussed the procedure including the risks, benefits and alternatives for the proposed anesthesia with the patient or authorized representative who has indicated his/her understanding and acceptance.     Dental advisory given  Plan Discussed with: CRNA  Anesthesia Plan Comments: (Plan Full PPE use  Plan MAC -WTP with same after Q&A  First eye - would like some sedation )         Anesthesia Quick Evaluation

## 2019-11-24 NOTE — Discharge Instructions (Signed)
Please discharge patient when stable, will follow up today with Dr. Bellamy Rubey at the [facility] [place] office immediately following discharge.  Leave shield in place until visit.  All paperwork with discharge instructions will be given at the office.  [facility] Address:  [address]  [place], [place] [address]             Monitored Anesthesia Care, Care After These instructions provide you with information about caring for yourself after your procedure. Your health care provider may also give you more specific instructions. Your treatment has been planned according to current medical practices, but problems sometimes occur. Call your health care provider if you have any problems or questions after your procedure. What can I expect after the procedure? After your procedure, you may:  Feel sleepy for several hours.  Feel clumsy and have poor balance for several hours.  Feel forgetful about what happened after the procedure.  Have poor judgment for several hours.  Feel nauseous or vomit.  Have a sore throat if you had a breathing tube during the procedure. Follow these instructions at home: For at least 24 hours after the procedure:      Have a responsible adult stay with you. It is important to have someone help care for you until you are awake and alert.  Rest as needed.  Do not: ? Participate in activities in which you could fall or become injured. ? Drive. ? Use heavy machinery. ? Drink alcohol. ? Take sleeping pills or medicines that cause drowsiness. ? Make important decisions or sign legal documents. ? Take care of children on your own. Eating and drinking  Follow the diet that is recommended by your health care provider.  If you vomit, drink water, juice, or soup when you can drink without vomiting.  Make sure you have little or no nausea before eating solid foods. General instructions  Take over-the-counter and  prescription medicines only as told by your health care provider.  If you have sleep apnea, surgery and certain medicines can increase your risk for breathing problems. Follow instructions from your health care provider about wearing your sleep device: ? Anytime you are sleeping, including during daytime naps. ? While taking prescription pain medicines, sleeping medicines, or medicines that make you drowsy.  If you smoke, do not smoke without supervision.  Keep all follow-up visits as told by your health care provider. This is important. Contact a health care provider if:  You keep feeling nauseous or you keep vomiting.  You feel light-headed.  You develop a rash.  You have a fever. Get help right away if:  You have trouble breathing. Summary  For several hours after your procedure, you may feel sleepy and have poor judgment.  Have a responsible adult stay with you for at least 24 hours or until you are awake and alert. This information is not intended to replace advice given to you by your health care provider. Make sure you discuss any questions you have with your health care provider. Document Revised: 12/17/2017 Document Reviewed: 01/09/2016 Elsevier Patient Education  2020 Elsevier Inc.  

## 2019-11-24 NOTE — Interval H&P Note (Signed)
History and Physical Interval Note: The H and P was reviewed and updated. The patient was examined.  No changes were found after exam.  The surgical eye was marked.  11/24/2019 8:53 AM  Dylan Cline  has presented today for surgery, with the diagnosis of Nuclear sclerotic cataract - Right eye.  The various methods of treatment have been discussed with the patient and family. After consideration of risks, benefits and other options for treatment, the patient has consented to  Procedure(s) with comments: CATARACT EXTRACTION PHACO AND INTRAOCULAR LENS PLACEMENT (IOC) (Right) - right as a surgical intervention.  The patient's history has been reviewed, patient examined, no change in status, stable for surgery.  I have reviewed the patient's chart and labs.  Questions were answered to the patient's satisfaction.     [name]

## 2019-11-24 NOTE — Transfer of Care (Signed)
Immediate Anesthesia Transfer of Care Note  Patient: Dylan Cline  Procedure(s) Performed: CATARACT EXTRACTION PHACO AND INTRAOCULAR LENS PLACEMENT (IOC) (Right Eye)  Patient Location: PACU  Anesthesia Type:MAC  Level of Consciousness: awake, alert  and oriented  Airway & Oxygen Therapy: Patient Spontanous Breathing  Post-op Assessment: Report given to RN, Post -op Vital signs reviewed and stable and Patient moving all extremities X 4  Post vital signs: Reviewed and stable  Last Vitals:  Vitals Value Taken Time  BP    Temp    Pulse    Resp    SpO2      Last Pain:  Vitals:   11/24/19 0726  TempSrc: Oral  PainSc: 0-No pain         Complications: No apparent anesthesia complications

## 2019-11-24 NOTE — Anesthesia Postprocedure Evaluation (Signed)
Anesthesia Post Note  Patient: Dylan Cline  Procedure(s) Performed: CATARACT EXTRACTION PHACO AND INTRAOCULAR LENS PLACEMENT (IOC) (Right Eye)  Patient location during evaluation: PACU Anesthesia Type: MAC Level of consciousness: awake and alert Pain management: pain level controlled Vital Signs Assessment: post-procedure vital signs reviewed and stable Respiratory status: spontaneous breathing, nonlabored ventilation, respiratory function stable and patient connected to nasal cannula oxygen Cardiovascular status: stable and blood pressure returned to baseline Postop Assessment: no apparent nausea or vomiting Anesthetic complications: no     Last Vitals:  Vitals:   11/24/19 0726 11/24/19 0918  BP: (!) 156/79 137/78  Pulse: 73 68  Resp: (!) 21 18  Temp: 36.7 C 36.6 C  SpO2: 99% 97%    Last Pain:  Vitals:   11/24/19 0918  TempSrc: Oral  PainSc: 0-No pain                 Talitha Givens

## 2019-11-24 NOTE — Op Note (Signed)
Date of procedure: 11/24/19  Pre-operative diagnosis: Visually significant age-related nuclear cataract, Right Eye (H25.11)  Post-operative diagnosis: Visually significant age-related nuclear cataract, Right Eye  Procedure: Removal of cataract via phacoemulsification and insertion of intra-ocular lens Wynetta Emery and [employer] DCB00  +21.0D into the capsular bag of the Right Eye  Attending surgeon: Gerda Diss. Ermalinda Joubert, MD, MA  Anesthesia: MAC, Topical Akten  Complications: None  Estimated Blood Loss: <62m (minimal)  Specimens: None  Implants: As above  Indications:  Visually significant age-related cataract, Right Eye  Procedure:  The patient was seen and identified in the pre-operative area. The operative eye was identified and dilated.  The operative eye was marked.  Topical anesthesia was administered to the operative eye.     The patient was then to the operative suite and placed in the supine position.  A timeout was performed confirming the patient, procedure to be performed, and all other relevant information.   The patient's face was prepped and draped in the usual fashion for intra-ocular surgery.  A lid speculum was placed into the operative eye and the surgical microscope moved into place and focused.  A superotemporal paracentesis was created using a 20 gauge paracentesis blade.  Shugarcaine was injected into the anterior chamber.  Viscoelastic was injected into the anterior chamber.  A temporal clear-corneal main wound incision was created using a 2.443mmicrokeratome.  A continuous curvilinear capsulorrhexis was initiated using an irrigating cystitome and completed using capsulorrhexis forceps.  Hydrodissection and hydrodeliniation were performed.  Viscoelastic was injected into the anterior chamber.  A phacoemulsification handpiece and a chopper as a second instrument were used to remove the nucleus and epinucleus. The irrigation/aspiration handpiece was used to remove any  remaining cortical material.   The capsular bag was reinflated with viscoelastic, checked, and found to be intact.  The intraocular lens was inserted into the capsular bag.  The irrigation/aspiration handpiece was used to remove any remaining viscoelastic.  The clear corneal wound and paracentesis wounds were then hydrated and checked with Weck-Cels to be watertight.  The lid-speculum and drape was removed, and the patient's face was cleaned with a wet and dry 4x4.  Maxitrol was instilled in the eye before a clear shield was taped over the eye. The patient was taken to the post-operative care unit in good condition, having tolerated the procedure well.  Post-Op Instructions: [facility] and will receive all other orders and instructions.

## 2019-12-04 ENCOUNTER — Encounter (HOSPITAL_COMMUNITY): Admission: RE | Admit: 2019-12-04 | Payer: Medicare HMO | Source: Ambulatory Visit

## 2019-12-05 ENCOUNTER — Other Ambulatory Visit: Payer: Self-pay

## 2019-12-05 ENCOUNTER — Other Ambulatory Visit (HOSPITAL_COMMUNITY)
Admission: RE | Admit: 2019-12-05 | Discharge: 2019-12-05 | Disposition: A | Discharge status: Home / Self Care | Payer: Medicare HMO | Source: Ambulatory Visit | Attending: Ophthalmology | Admitting: Ophthalmology

## 2019-12-08 ENCOUNTER — Ambulatory Visit: Admit: 2019-12-08 | Payer: Medicare HMO | Admitting: Ophthalmology

## 2019-12-08 SURGERY — PHACOEMULSIFICATION, CATARACT, WITH IOL INSERTION
Anesthesia: Monitor Anesthesia Care | Laterality: Left

## 2019-12-12 ENCOUNTER — Ambulatory Visit: Payer: Medicare HMO

## 2019-12-31 ENCOUNTER — Other Ambulatory Visit: Payer: Self-pay | Admitting: *Deleted

## 2019-12-31 DIAGNOSIS — Z87891 Personal history of nicotine dependence: Secondary | ICD-10-CM

## 2019-12-31 DIAGNOSIS — F17210 Nicotine dependence, cigarettes, uncomplicated: Secondary | ICD-10-CM

## 2020-02-02 ENCOUNTER — Encounter: Payer: Self-pay | Admitting: Acute Care

## 2020-02-02 ENCOUNTER — Ambulatory Visit (INDEPENDENT_AMBULATORY_CARE_PROVIDER_SITE_OTHER)
Admission: RE | Admit: 2020-02-02 | Discharge: 2020-02-02 | Disposition: A | Discharge status: Home / Self Care | Payer: Medicare HMO | Source: Ambulatory Visit | Attending: Acute Care | Admitting: Acute Care

## 2020-02-02 ENCOUNTER — Other Ambulatory Visit: Payer: Self-pay

## 2020-02-02 ENCOUNTER — Ambulatory Visit (INDEPENDENT_AMBULATORY_CARE_PROVIDER_SITE_OTHER): Payer: Medicare HMO | Admitting: Acute Care

## 2020-02-02 DIAGNOSIS — F17210 Nicotine dependence, cigarettes, uncomplicated: Secondary | ICD-10-CM

## 2020-02-02 DIAGNOSIS — Z122 Encounter for screening for malignant neoplasm of respiratory organs: Secondary | ICD-10-CM | POA: Diagnosis not present

## 2020-02-02 DIAGNOSIS — Z87891 Personal history of nicotine dependence: Secondary | ICD-10-CM

## 2020-02-02 NOTE — Progress Notes (Signed)
Shared Decision Making Visit Lung Cancer Screening Program [phone])   Eligibility:  Age 69 y.o.  Pack Years Smoking History Calculation 49 pack year smoking history (# packs/per year x # years smoked)  Recent History of coughing up blood  no  Unexplained weight loss? no ( >Than 15 pounds within the last 6 months )  Prior History Lung / other cancer no (Diagnosis within the last 5 years already requiring surveillance chest CT Scans).  Smoking Status Current Smoker  Former Smokers: Years since quit NA  Quit Date: NA  Visit Components:  Discussion included one or more decision making aids. yes  Discussion included risk/benefits of screening. yes  Discussion included potential follow up diagnostic testing for abnormal scans. yes  Discussion included meaning and risk of over diagnosis. yes  Discussion included meaning and risk of False Positives. yes  Discussion included meaning of total radiation exposure. yes  Counseling Included:  Importance of adherence to annual lung cancer LDCT screening. yes  Impact of comorbidities on ability to participate in the program. yes  Ability and willingness to under diagnostic treatment. yes  Smoking Cessation Counseling:  Current Smokers:   Discussed importance of smoking cessation. yes  Information about tobacco cessation classes and interventions provided to patient. yes  Patient provided with "ticket" for LDCT Scan. yes  Symptomatic Patient. no  Counseling  Diagnosis Code: Tobacco Use Z72.0  Asymptomatic Patient yes  Counseling (Intermediate counseling: > three minutes counseling) B2841  Former Smokers:   Discussed the importance of maintaining cigarette abstinence. yes  Diagnosis Code: Personal History of Nicotine Dependence. L24.401  Information about tobacco cessation classes and interventions provided to patient. Yes  Patient provided with "ticket" for LDCT Scan. yes  Written Order for Lung Cancer  Screening with LDCT placed in Epic. Yes (CT Chest Lung Cancer Screening Low Dose W/O CM) [id number] Z12.2-Screening of respiratory organs Z87.891-Personal history of nicotine dependence  I have spent 25 minutes of face to face time with Mr. Swinger discussing the risks and benefits of lung cancer screening. We viewed a power point together that explained in detail the above noted topics. We paused at intervals to allow for questions to be asked and answered to ensure understanding.We discussed that the single most powerful action that he can take to decrease his risk of developing lung cancer is to quit smoking. We discussed whether or not he is ready to commit to setting a quit date. We discussed options for tools to aid in quitting smoking including nicotine replacement therapy, non-nicotine medications, support groups, Quit Smart classes, and behavior modification. We discussed that often times setting smaller, more achievable goals, such as eliminating 1 cigarette a day for a week and then 2 cigarettes a day for a week can be helpful in slowly decreasing the number of cigarettes smoked. This allows for a sense of accomplishment as well as providing a clinical benefit. I gave him the " Be Stronger Than Your Excuses" card with contact information for community resources, classes, free nicotine replacement therapy, and access to mobile apps, text messaging, and on-line smoking cessation help. I have also given him my card and contact information in the event he needs to contact me. We discussed the time and location of the scan, and that either Doroteo Glassman RN or I will call with the results within 24-48 hours of receiving them. I have offered him  a copy of the power point we viewed  as a resource in the event they need reinforcement of  the concepts we discussed today in the office. The patient verbalized understanding of all of  the above and had no further questions upon leaving the office. They have my contact  information in the event they have any further questions.  I spent 3 minutes counseling on smoking cessation and the health risks of continued tobacco abuse.  I explained to the patient that there has been a high incidence of coronary artery disease noted on these exams. I explained that this is a non-gated exam therefore degree or severity cannot be determined. This patient is  Currently on statin therapy. I have asked the patient to follow-up with their PCP regarding any incidental finding of coronary artery disease and management with diet or medication as their PCP  feels is clinically indicated. The patient verbalized understanding of the above and had no further questions upon completion of the visit.   Pt. Is contemplating quitting. He is not ready to set a quit date today.   Magdalen Spatz, NP 02/02/2020

## 2020-02-02 NOTE — Patient Instructions (Signed)
Thank you for participating in the [facility] Scre[facility]. It was our pleasure to meet you today. We will call you with the results of your scan within the next few days. Your scan will be assigned a Lung RADS category score by the physicians reading the scans.  This Lung RADS score determines follow up scanning.  See below for description of categories, and follow up screening recommendations. We will be in touch to schedule your follow up screening annually or based on recommendations of our providers. We will fax a copy of your scan results to your Primary Care Physician, or the physician who referred you to the program, to ensure they have the results. Please call the office if you have any questions or concerns regarding your scanning experience or results.  Our office number is [phone]. Please speak with Denise Phelps, RN. She is our Lung Cancer Screening RN. If she is unavailable when you call, please have the office staff send her a message. She will return your call at her earliest convenience. Remember, if your scan is normal, we will scan you annually as long as you continue to meet the criteria for the program. (Age 55-77, Current smoker or smoker who has quit within the last 15 years). If you are a smoker, remember, quitting is the single most powerful action that you can take to decrease your risk of lung cancer and other pulmonary, breathing related problems. We know quitting is hard, and we are here to help.  Please let us know if there is anything we can do to help you meet your goal of quitting. If you are a former smoker, congratulations. We are proud of you! Remain smoke free! Remember you can refer friends or family members through the number above.  We will screen them to make sure they meet criteria for the program. Thank you for helping us take better care of you by participating in Lung Screening.  Lung RADS Categories:  Lung RADS 1: no nodules  or definitely non-concerning nodules.  Recommendation is for a repeat annual scan in 12 months.  Lung RADS 2:  nodules that are non-concerning in appearance and behavior with a very low likelihood of becoming an active cancer. Recommendation is for a repeat annual scan in 12 months.  Lung RADS 3: nodules that are probably non-concerning , includes nodules with a low likelihood of becoming an active cancer.  Recommendation is for a 6-month repeat screening scan. Often noted after an upper respiratory illness. We will be in touch to make sure you have no questions, and to schedule your 6-month scan.  Lung RADS 4 A: nodules with concerning findings, recommendation is most often for a follow up scan in 3 months or additional testing based on our provider's assessment of the scan. We will be in touch to make sure you have no questions and to schedule the recommended 3 month follow up scan.  Lung RADS 4 B:  indicates findings that are concerning. We will be in touch with you to schedule additional diagnostic testing based on our provider's  assessment of the scan.   

## 2020-02-03 ENCOUNTER — Other Ambulatory Visit: Payer: Self-pay | Admitting: *Deleted

## 2020-02-03 DIAGNOSIS — Z87891 Personal history of nicotine dependence: Secondary | ICD-10-CM

## 2020-02-03 DIAGNOSIS — F17210 Nicotine dependence, cigarettes, uncomplicated: Secondary | ICD-10-CM

## 2020-02-03 NOTE — Progress Notes (Signed)
I have called the patient with the results of his scan. I explained that his scan was read as a Lung  RADS 3, nodules that are probably benign findings, short term follow up suggested: includes nodules with a low likelihood of becoming a clinically active cancer. Radiology recommends a 6 month repeat LDCT follow up.  I also explained that there was notification of  The patient verbalized understanding and had no further questions at completion of the call.   Langley Gauss, please fax results to PCP and place order for 6 month follow up 03/2020. Thanks so much

## 2020-03-07 IMAGING — CT CT ABDOMEN AND PELVIS WITHOUT CONTRAST
2 of 4 series · 15 of 46 positions shown, 17 images · non-contrast
Comparison: 05/24/2013

CLINICAL DATA: sent from his PCP with abnormal labs. Patient has
history of pancreatitis, diabetes, hypertension and gastritis. He
reports that over the last 2 weeks he has been feeling unwell. His
p.o. intake has gone down. He has been having some epigastric
abdominal pain that is nonradiating. He denies any nausea, vomiting,
diarrhea, blood loss. He denies any new medications, recent fevers
or chills.He went to his PCP today and was advised to come to the ER
after his kidney was found to be damage.

EXAM:
CT ABDOMEN AND PELVIS WITHOUT CONTRAST
TECHNIQUE: Multidetector CT imaging of the abdomen and pelvis was performed
following the standard protocol without IV contrast.

[Series 2: axial st · axial · 0.72mm/px · z∈[-581,-151]mm · 12 of 96 slices shown, 14 images]
[im 5/96  soft-tissue]
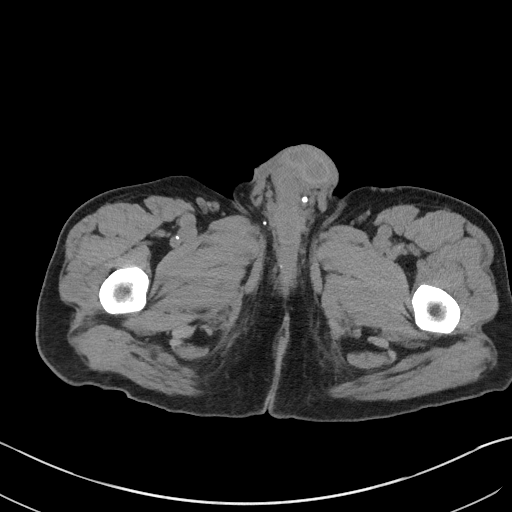
[im 5/96  bone]
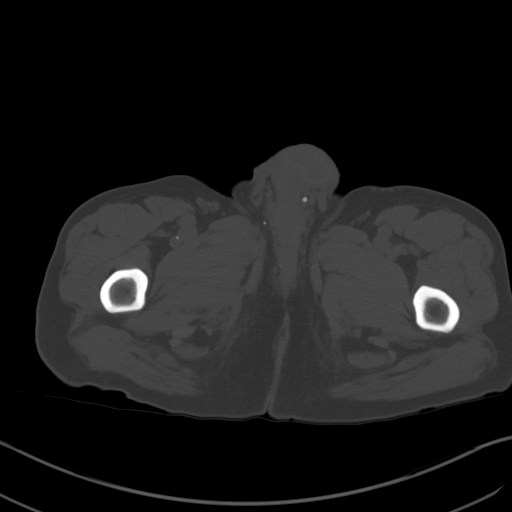
[im 15/96  soft-tissue]
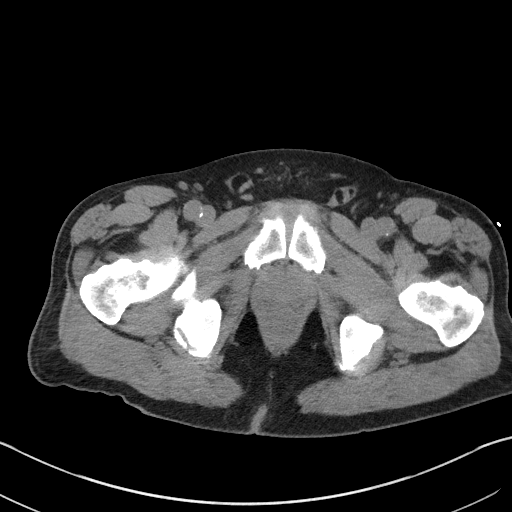
[im 20/96  soft-tissue]
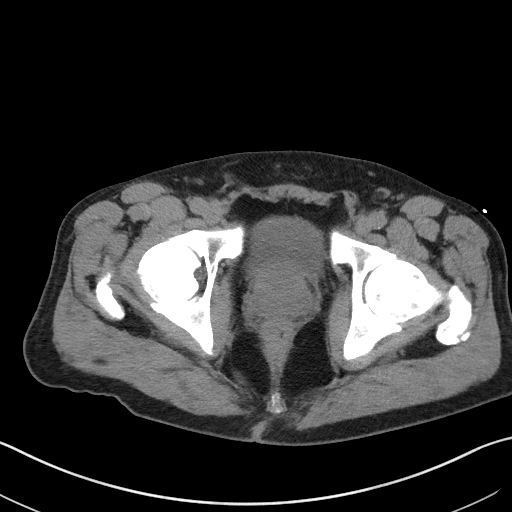
[im 29/96  soft-tissue]
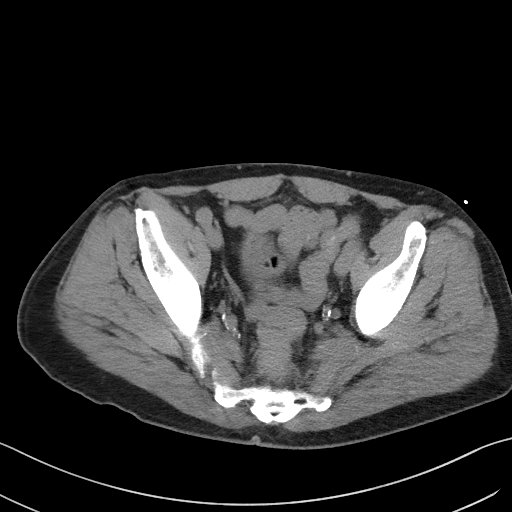
[im 39/96  soft-tissue]
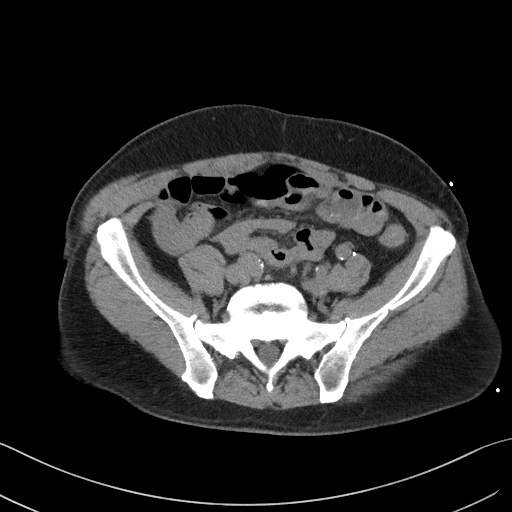
[im 43/96  soft-tissue]
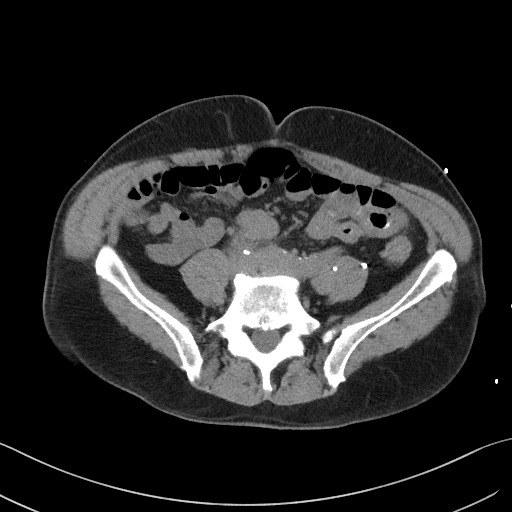
[im 53/96  soft-tissue]
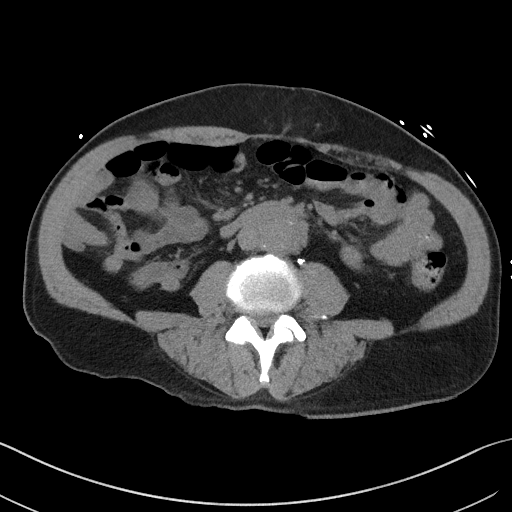
[im 58/96  soft-tissue]
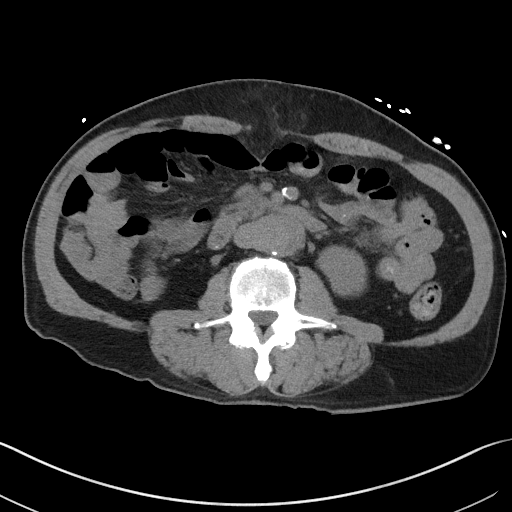
[im 67/96  soft-tissue]
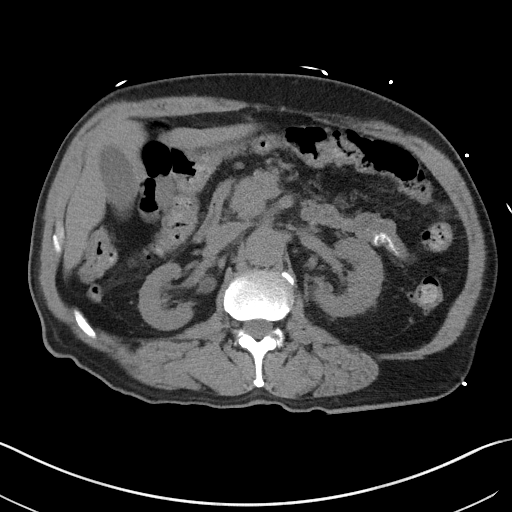
[im 67/96  bone]
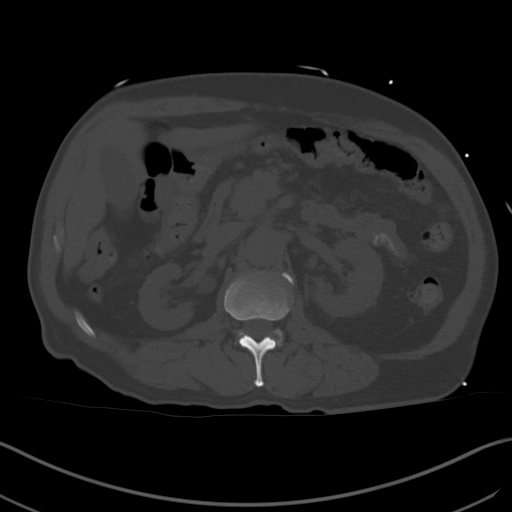
[im 77/96  soft-tissue]
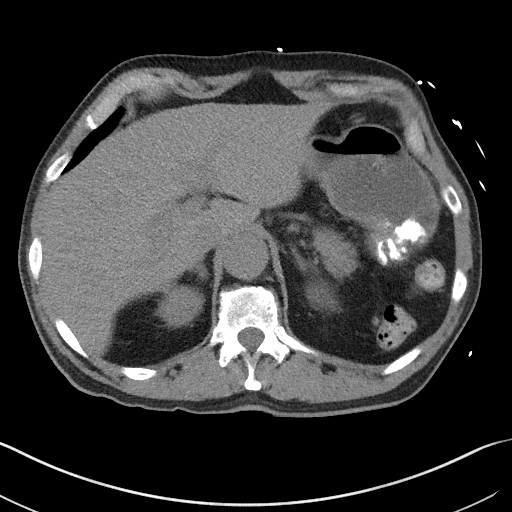
[im 81/96  soft-tissue]
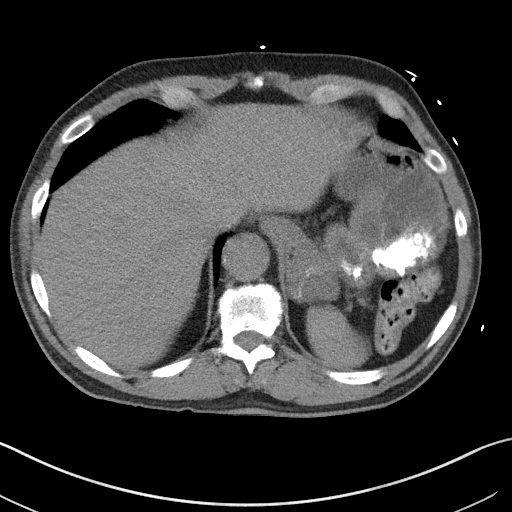
[im 91/96  soft-tissue]
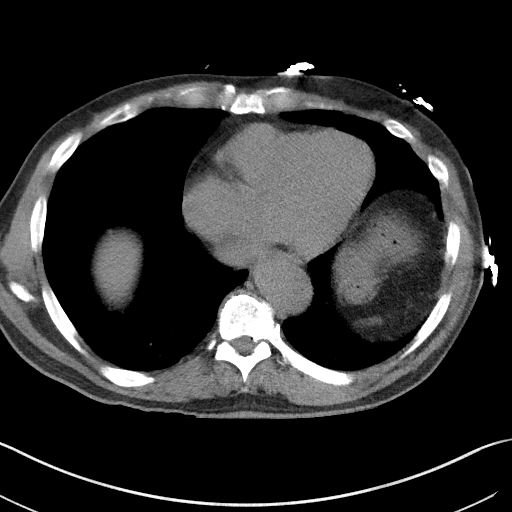

[Series 5: coronal st · coronal · 0.68mm/px · 3 of 88 slices shown]
[im 30/88  soft-tissue]
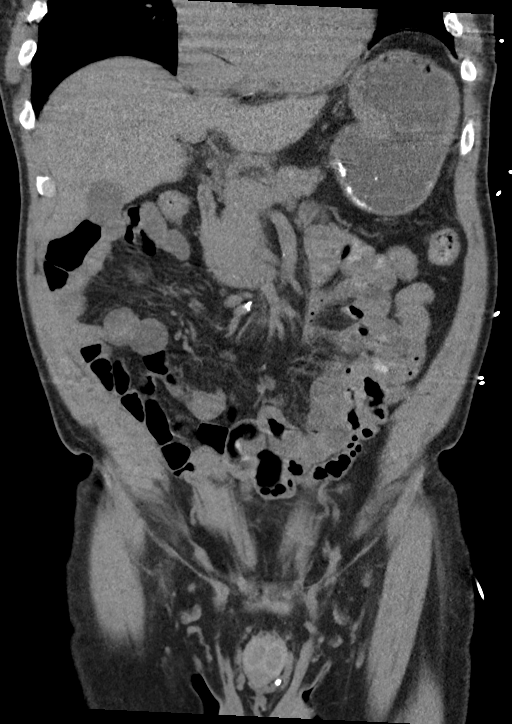
[im 39/88  soft-tissue]
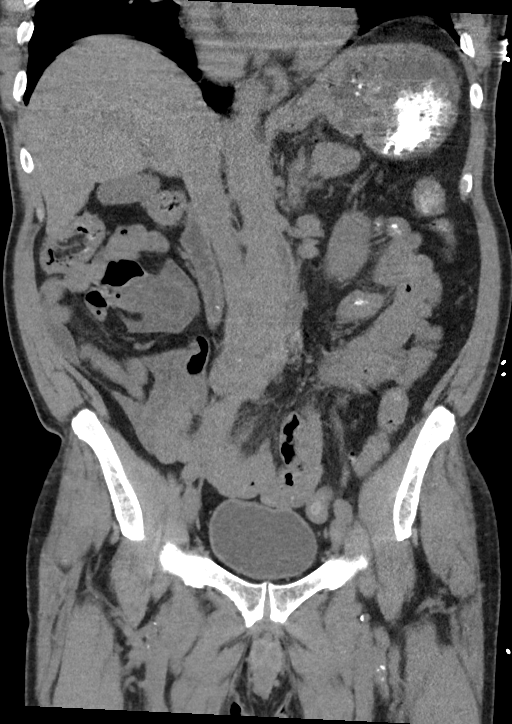
[im 49/88  soft-tissue]
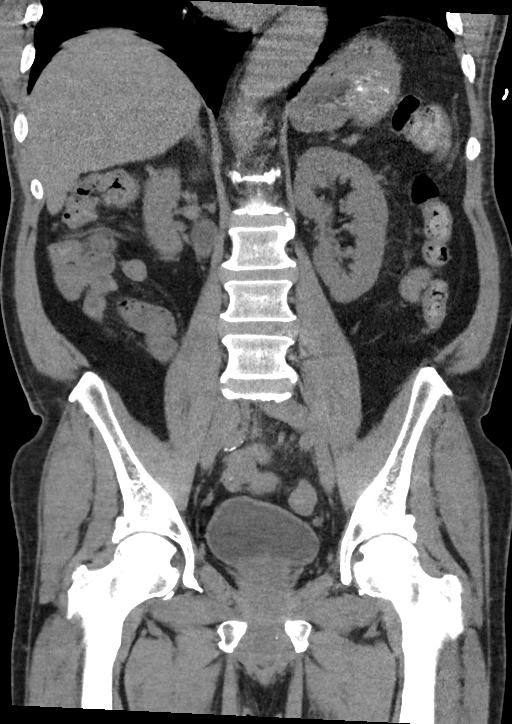

[15 of 46 positions shown; findings below may reference images not displayed]

FINDINGS: Lower chest: No acute findings.

Hepatobiliary: No focal liver abnormality is seen. No gallstones,
gallbladder wall thickening, or biliary dilatation.

Pancreas: Unremarkable. No pancreatic ductal dilatation or
surrounding inflammatory changes.

Spleen: Normal in size without focal abnormality.

Adrenals/Urinary Tract: No adrenal masses. Mild right renal cortical
thinning. Kidneys normal in position and orientation. No renal
masses or stones. No hydronephrosis.

Ureters are normal in course and in caliber.  No ureteral stones.

Bladder is unremarkable.

Stomach/Bowel: Stomach is unremarkable. Small bowel and colon are
normal in caliber. No wall thickening or inflammation. There are
scattered colonic diverticula without diverticulitis. What appears
to be the appendix lies in the right upper quadrant. It is dilated,
to 1.1 cm, but has a thin wall and contains air and a small amount
of contrast. This is also stable from the prior CT.

Vascular/Lymphatic: Diffuse aortic ectasia atherosclerosis. Aorta
measures 3.3 cm anterior-posterior at its hiatus. The infrarenal
aorta measures 3.2 cm anterior-posterior. There is some ill-defined
soft tissue surrounding the wall of the infrarenal abdominal aorta
extending to the proximal iliac arteries. This soft tissue is new
from the prior CT and the infrarenal aorta has increased in diameter
2.6 cm in this location on the prior CT.

No enlarged lymph nodes.

Reproductive: Prominent prostate measuring 4.8 x 3.8 cm
transversely.

Other: Small adjacent fat contained midline anterior abdominal wall
hernias. No bowel enters these. These are also stable from the prior
CT.

No ascites.

Musculoskeletal: No fracture or acute finding. No osteoblastic or
osteolytic lesions.
IMPRESSION: 1. No acute findings.
2. Abdominal aortic aneurysm, increased in size from the prior CT,
3.2 cm is infrarenal portion. There is soft tissue attenuation
surrounding the infrarenal abdominal aortic aneurysm extending to
the proximal portion of the common iliac arteries. This suggest
fibrosis. Consider follow-up contrast enhanced CT angiography to
assess aorta and its branch vessels, if the patient can tolerate
iodinated contrast.

## 2020-05-13 ENCOUNTER — Other Ambulatory Visit: Payer: Self-pay | Admitting: Pulmonary Disease

## 2020-05-13 DIAGNOSIS — R918 Other nonspecific abnormal finding of lung field: Secondary | ICD-10-CM

## 2020-05-14 ENCOUNTER — Ambulatory Visit: Payer: Medicare HMO | Admitting: Pulmonary Disease

## 2020-05-19 ENCOUNTER — Other Ambulatory Visit: Payer: Self-pay

## 2020-05-19 ENCOUNTER — Ambulatory Visit (INDEPENDENT_AMBULATORY_CARE_PROVIDER_SITE_OTHER): Payer: Medicare HMO | Admitting: Pulmonary Disease

## 2020-05-19 DIAGNOSIS — R918 Other nonspecific abnormal finding of lung field: Secondary | ICD-10-CM

## 2020-05-19 LAB — PULMONARY FUNCTION TEST
DL/VA % pred: 112 %
DL/VA: 4.59 ml/min/mmHg/L
DLCO cor % pred: 83 %
DLCO cor: 20.7 ml/min/mmHg
DLCO unc % pred: 83 %
DLCO unc: 20.7 ml/min/mmHg
FEF 25-75 Post: 1.66 L/sec
FEF 25-75 Pre: 1.78 L/sec
FEF2575-%Change-Post: -6 %
FEF2575-%Pred-Post: 70 %
FEF2575-%Pred-Pre: 75 %
FEV1-%Change-Post: 0 %
FEV1-%Pred-Post: 96 %
FEV1-%Pred-Pre: 96 %
FEV1-Post: 2.61 L
FEV1-Pre: 2.62 L
FEV1FVC-%Change-Post: 13 %
FEV1FVC-%Pred-Pre: 95 %
FEV6-%Change-Post: -10 %
FEV6-%Pred-Post: 90 %
FEV6-%Pred-Pre: 101 %
FEV6-Post: 3.13 L
FEV6-Pre: 3.49 L
FEV6FVC-%Change-Post: 1 %
FEV6FVC-%Pred-Post: 104 %
FEV6FVC-%Pred-Pre: 103 %
FVC-%Change-Post: -12 %
FVC-%Pred-Post: 86 %
FVC-%Pred-Pre: 99 %
FVC-Post: 3.14 L
FVC-Pre: 3.58 L
Post FEV1/FVC ratio: 83 %
Post FEV6/FVC ratio: 100 %
Pre FEV1/FVC ratio: 73 %
Pre FEV6/FVC Ratio: 99 %
RV % pred: 97 %
RV: 2.3 L
TLC % pred: 86 %
TLC: 5.81 L

## 2020-05-19 NOTE — Progress Notes (Signed)
PFT done today. 

## 2020-05-26 ENCOUNTER — Encounter (INDEPENDENT_AMBULATORY_CARE_PROVIDER_SITE_OTHER): Payer: Self-pay | Admitting: Gastroenterology

## 2020-06-09 NOTE — Progress Notes (Signed)
Attempted to call patient, no voicemail picked up. Will try again later.

## 2020-06-15 NOTE — Progress Notes (Signed)
Attempted to call patient, unable to leave voicemail due to mailbox being full. Put in recall for November with and per Dr Loanne Drilling.

## 2020-06-22 ENCOUNTER — Encounter: Payer: Self-pay | Admitting: *Deleted

## 2020-07-01 ENCOUNTER — Ambulatory Visit (INDEPENDENT_AMBULATORY_CARE_PROVIDER_SITE_OTHER): Payer: Medicare HMO | Admitting: Gastroenterology

## 2020-07-01 ENCOUNTER — Encounter: Payer: Self-pay | Admitting: Nurse Practitioner

## 2020-07-01 ENCOUNTER — Other Ambulatory Visit: Payer: Self-pay

## 2020-07-01 ENCOUNTER — Ambulatory Visit (INDEPENDENT_AMBULATORY_CARE_PROVIDER_SITE_OTHER): Payer: Medicare HMO | Admitting: Nurse Practitioner

## 2020-07-01 VITALS — BP 163/84 | HR 81 | Temp 97.9°F | Ht 69.0 in | Wt 173.0 lb

## 2020-07-01 DIAGNOSIS — R197 Diarrhea, unspecified: Secondary | ICD-10-CM | POA: Insufficient documentation

## 2020-07-01 DIAGNOSIS — R634 Abnormal weight loss: Secondary | ICD-10-CM

## 2020-07-01 DIAGNOSIS — R630 Anorexia: Secondary | ICD-10-CM

## 2020-07-01 DIAGNOSIS — K219 Gastro-esophageal reflux disease without esophagitis: Secondary | ICD-10-CM | POA: Diagnosis not present

## 2020-07-01 NOTE — Patient Instructions (Signed)
Your health issues we discussed today were:   Poor appetite and weight loss: 1. It is important to remember to "eat to live" 2. Start using a supplement such as Ensure, boost, Premier protein, muscle milk, any protein supplements-drink to make sure you are getting adequate nutrition 3. We will request recent blood work from your primary care 4. Follow-up in 2 months  GERD (reflux/heartburn): 1. Continue taking your acid blocker 2. Let us know if you have any worsening or severe symptoms  Diarrhea: 1. Have your stool studies completed as soon as you are able to 2. We will request your blood work from your primary care 3. As we discussed, we will schedule a colonoscopy 4. Start taking a probiotic for the next 2 to 3 months. We've had good success with Align, Restora, [employer], [employer], and [employer]; however there are many good options and you can discuss with the pharmacist if you would like further guidance. 5. Call us for any worsening or severe symptoms  Weight loss: 1. Continue to "eat to live" using supplements as described above 2. We will schedule a colonoscopy and upper endoscopy to evaluate for GI causes of your weight loss 3. Further recommendations will follow  Overall I recommend:  1. Continue your other current medications 2. Return for follow-up in 2 months 3. Call us for any questions or concerns   ---------------------------------------------------------------  I am glad you have gotten your COVID-19 vaccination!  Even though you are fully vaccinated you should continue to follow CDC and state/local guidelines.  ---------------------------------------------------------------   At [facility] we value your feedback. You may receive a survey about your visit today. Please share your experience as we strive to create trusting relationships with our patients to provide genuine, compassionate, quality care.  We appreciate your  understanding and patience as we review any laboratory studies, imaging, and other diagnostic tests that are ordered as we care for you. Our office policy is 5 business days for review of these results, and any emergent or urgent results are addressed in a timely manner for your best interest. If you do not hear from our office in 1 week, please contact us.   We also encourage the use of MyChart, which contains your medical information for your review as well. If you are not enrolled in this feature, an access code is on this after visit summary for your convenience. Thank you for allowing [place] to be involved in your care.  It was great to see you today!  I hope you have a great Fall!!

## 2020-07-01 NOTE — H&P (Signed)
Surgical History & Physical  Patient Name: Dylan Cline      DOB: Apr 23, 1951  Surgery: Cataract extraction with intraocular lens implant phacoemulsification; Left Eye  Surgeon: [name] MD Surgery Date:  07/16/2020 Pre-Op Date:  07/01/2020  HPI: A 89 Yr. old male patient is here for cataract re-eval OS. 1. 1. The patient complains of difficulty when viewing TV, reading closed caption, news scrolls on TV, which began 1 year ago. The left eye is affected. The episode is gradual. The condition's severity increased since last visit. Symptoms occur when the patient is inside and outside. This is negatively affecting the patient's quality of life. HPI was performed by [name] .  Medical History: Phaco/IOL OD (2021, Berdina Cheever) Cataract OS Diabetes High Blood Pressure LDL  Review of Systems Negative Allergic/Immunologic Negative Cardiovascular Negative Constitutional Negative Ear, Nose, Mouth & Throat Negative Endocrine Negative Eyes Negative Gastrointestinal Negative Genitourinary Negative Hemotologic/Lymphatic Negative Integumentary Negative Musculoskeletal Negative Neurological Negative Psychiatry Negative Respiratory  Social   Current every day smoker   Medication Moxifloxacin, Ilevro, Prednisolone acetate 1%,  Amlodipine Besylate, Atorvastatin, HCTZ, Januvia, Levemir, Prednisolone acetate,   Sx/Procedures Phaco c IOL OD,  Neck cyst removal,   Drug Allergies   NKDA  History & Physical: Heent:  Cataract, Left eye NECK: supple without bruits LUNGS: lungs clear to auscultation CV: regular rate and rhythm Abdomen: soft and non-tender  Impression & Plan: Assessment: 1.  COMBINED FORMS AGE RELATED CATARACT; Left Eye (H25.812) 2.  BLEPHARITIS; Right Lower Lid, Left Upper Lid, Left Lower Lid, Right Upper Lid (H01.001, H01.002,H01.004,H01.005) 3.  INTRAOCULAR LENS IOL (Z96.1) 4.  Pinguecula; Both Eyes (H11.153) 5.  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  Cataract accounts for the patient's decreased vision. This visual impairment is not correctable with a tolerable change in glasses or contact lenses. Cataract surgery with an implantation of a new lens should significantly improve the visual and functional status of the patient. Discussed all risks, benefits, alternatives, and potential complications. Discussed the procedures and recovery. Patient desires to have surgery. A-scan ordered and performed today for intra-ocular lens calculations. The surgery will be performed in order to improve vision for driving, reading, and for eye examinations. Recommend phacoemulsification with intra-ocular lens. Left Eye Dilates poorly - shugacaine by protocol. Malyugin Ring in room. Omidria. Surgery required to correct imbalance of vision. 2.  Begin/continue lid scrubs. 3.  Stable. Doing well since surgery 4.  Observe; Artificial tears as needed for irritation. 5.  Discussed significance of finding

## 2020-07-01 NOTE — Progress Notes (Signed)
Referring Provider: No ref. provider found Primary Care Physician:  Patient, No Pcp Per Primary GI:  Dr. Gala Romney  Chief Complaint  Patient presents with  . Weight Loss    feeling full, not eating well   . Diarrhea    HPI:   Dylan Cline is a 69 y.o. male who presents on referral from primary care for weight loss, early satiety, not eating well, diarrhea.  Patient was last seen in our office 09/04/2019 for GERD, abnormal weight loss, periumbilical abdominal pain.  History of alcohol induced pancreatitis, weight loss, abdominal pain, GERD.  EGD in 2006 for epigastric pain, nausea, vomiting and abnormal CT of the abdomen suggesting pancreatic mass with findings included normal esophagus, submucosal petechial hemorrhages likely as a consequence of vomiting trauma.  Recommended CA 19-9 and if elevated proceed to FNA CT-guided biopsy.  No lab results could be found.  Also notes history of pancreatic pseudocyst and did eventually undergo an FNA at [facility] and repeat CT of the abdomen, decreased size of the pancreatic pseudocyst, no new cysts or discrete solid pancreatic masses.  Most recent CT imaging in 2014 with changes of acute pancreatitis involving the head of the pancreas, further decrease in size of the small post pancreatitis fluid collections in the distal body and proximal tail.  Remote peptic ulcer disease and GERD symptoms without improvement on omeprazole, admitted ongoing Excedrin use at that time.  Drinks a pint to a pint and a half of wine a day.  Admission in August 2020 for acute kidney failure with creatinine 5.03.  Epigastric pain improved with PPI.  Recommended omeprazole twice daily for a month and then indefinitely for once daily.  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  At his last visit he was doing well without major complications.  GERD doing better on PPI.  Notify PCP if occasional increased heart rate.  No other overt GI complaints.  Recommended continue PPI, refill omeprazole, follow-up in 1 year.  Today he states he is doing okay overall. He started having diarrhea about 3 weeks ago or a month ago. Started every bowel movement watery stools, now about 3/4 to half are watery. Currently having one stool a day, initially was having 3-4 stools a day. No recent travel or healthcare exposure. Has city water at home. No abdominal pain with his stools. Denies N/V. Also notes poor appetite since April or May. States his weight was 198 to 165 over a month to 6 weeks, just started picking back up and eating better now. GERD well managed on Prilosec. Denies hematochezia, melena, fever, chills. Denies URI or flu-like symptoms. Denies loss of sense of taste or smell. The patient has received COVID-19 vaccination(s). Denies chest pain, dyspnea, dizziness, lightheadedness, syncope, near syncope. Denies any other upper or lower GI symptoms.  Objectively his weight is up 2 lbs (171 lb -> 173 lb since 09/2019). States he only eats about once a day.  He has never had a colonoscopy, thinks he remembers being told he needs a colonoscopy.  Past Medical History:  Diagnosis Date  . Acute type 2 diabetes mellitus with manifestations ([place])    no  meds currently  . CRI (chronic renal insufficiency), stage 3 (moderate)   . GERD (gastroesophageal reflux disease)   . Hypertension   . Mass of left parotid gland   . Pancreatitis   . Pancreatitis, alcoholic, acute 95/10/8839  . Smoker     Past Surgical History:  Procedure Laterality Date  . CATARACT EXTRACTION W/PHACO Right 11/24/2019   Procedure: CATARACT EXTRACTION PHACO AND INTRAOCULAR LENS  PLACEMENT (IOC);  Surgeon: [name], MD;  Location: [facility];  Service: Ophthalmology;  Laterality: Right;  CDE: 4.96  . COLONOSCOPY   01/27/2005   YSA:YTKZSWFU hemorrhoids, otherwise normal rectum/Normal colon, marginal prep made the exam more difficult  . ESOPHAGOGASTRODUODENOSCOPY   10/12/2004   XNA:TFTDDU esophagus/Focal area of submucosal petechial hemorrhage in the fundus (likely a trauma-induced phenomenon secondary to vomiting), otherwise normal  . LAPAROSCOPIC GASTROTOMY W/ REPAIR OF ULCER    . PAROTIDECTOMY Left 07/29/2019   Procedure: LEFT PAROTIDECTOMY;  Surgeon: Leta Baptist, MD;  Location: [facility];  Service: ENT;  Laterality: Left;    Current Outpatient Medications  Medication Sig Dispense Refill  . amLODipine (NORVASC) 2.5 MG tablet Take 1 tablet (2.5 mg total) by mouth daily. 30 tablet 1  . atorvastatin (LIPITOR) 40 MG tablet Take 40 mg by mouth daily.    . hydroxypropyl methylcellulose / hypromellose (ISOPTO TEARS / GONIOVISC) 2.5 % ophthalmic solution Place 1 drop into both eyes 2 (two) times daily as needed for dry eyes.    Marland Kitchen JANUVIA 50 MG tablet Take 50 mg by mouth daily.    Marland Kitchen LEVEMIR FLEXTOUCH 100 UNIT/ML Pen Inject 17 Units into the skin at bedtime.    Marland Kitchen omeprazole (PRILOSEC) 40 MG capsule Take 1 capsule (40 mg total) by mouth 2 (two) times daily. 180 capsule 3  . tamsulosin (FLOMAX) 0.4 MG CAPS capsule Take 0.4 mg by mouth at bedtime.     No current facility-administered medications for this visit.    Allergies as of 07/01/2020  . (No Known Allergies)    Family History  Problem Relation Age of Onset  . Diabetes Mother   . Diabetes Father   . Heart attack Maternal Grandfather   . Cancer Paternal Grandmother   . Heart attack Maternal Uncle   . Cancer Maternal Aunt   . Cancer Maternal Uncle   . Colon cancer Neg Hx   . Pancreatic cancer Neg Hx   . Stomach cancer Neg Hx     Social History   Socioeconomic History  . Marital status:  Married    Spouse name: Not on file  . Number of children: Not on file  . Years of education: Not on file  . Highest education level: Not on file  Occupational History  . Not on file  Tobacco Use  . Smoking status: Current Every Day Smoker    Packs/day: 1.00    Years: 40.00    Pack years: 40.00    Types: Cigarettes    Start date: 12/01/1970  . Smokeless tobacco: Never Used  Vaping Use  . Vaping Use: Never used  Substance and Sexual Activity  . Alcohol use: Yes    Alcohol/week: 0.0 standard drinks    Comment: 07/01/20: drinks a bottle of wine a day  . Drug use: Yes    Types: Methamphetamines, Marijuana    Comment: occasionally to stimulate appetite  . Sexual activity: Not on file  Other Topics Concern  . Not on file  Social History Narrative  . Not on file  Social Determinants of Health   Financial Resource Strain:   . Difficulty of Paying Living Expenses: Not on file  Food Insecurity:   . Worried About Charity fundraiser in the Last Year: Not on file  . Ran Out of Food in the Last Year: Not on file  Transportation Needs:   . Lack of Transportation (Medical): Not on file  . Lack of Transportation (Non-Medical): Not on file  Physical Activity:   . Days of Exercise per Week: Not on file  . Minutes of Exercise per Session: Not on file  Stress:   . Feeling of Stress : Not on file  Social Connections:   . Frequency of Communication with Friends and Family: Not on file  . Frequency of Social Gatherings with Friends and Family: Not on file  . Attends Religious Services: Not on file  . Active Member of Clubs or Organizations: Not on file  . Attends Archivist Meetings: Not on file  . Marital Status: Not on file    Subjective: Review of Systems  Constitutional: Negative for chills, fever, malaise/fatigue and weight loss.  HENT: Negative for congestion and sore throat.   Respiratory: Negative for cough and shortness of breath.   Cardiovascular: Negative for  chest pain and palpitations.  Gastrointestinal: Negative for abdominal pain, blood in stool, diarrhea, melena, nausea and vomiting.  Musculoskeletal: Negative for joint pain and myalgias.  Skin: Negative for rash.  Neurological: Negative for dizziness and weakness.  Endo/Heme/Allergies: Does not bruise/bleed easily.  Psychiatric/Behavioral: Negative for depression. The patient is not nervous/anxious.   All other systems reviewed and are negative.    Objective: BP (!) 163/84   Pulse 81   Temp 97.9 F (36.6 C) (Temporal)   Ht _0  (1.753 m)   Wt 173 lb (78.5 kg)   BMI 25.55 kg/m  Physical Exam Vitals and nursing note reviewed.  Constitutional:      General: He is not in acute distress.    Appearance: Normal appearance. He is not ill-appearing, toxic-appearing or diaphoretic.  HENT:     Head: Normocephalic and atraumatic.     Nose: No congestion or rhinorrhea.  Eyes:     General: No scleral icterus. Cardiovascular:     Rate and Rhythm: Normal rate and regular rhythm.     Heart sounds: Normal heart sounds.  Pulmonary:     Effort: Pulmonary effort is normal.     Breath sounds: Normal breath sounds.  Abdominal:     General: Bowel sounds are normal. There is no distension.     Palpations: Abdomen is soft. There is no hepatomegaly, splenomegaly or mass.     Tenderness: There is no abdominal tenderness. There is no guarding or rebound.     Hernia: No hernia is present.  Musculoskeletal:     Cervical back: Neck supple.  Skin:    General: Skin is warm and dry.     Coloration: Skin is not jaundiced.     Findings: No bruising or rash.  Neurological:     General: No focal deficit present.     Mental Status: He is alert and oriented to person, place, and time. Mental status is at baseline.  Psychiatric:        Mood and Affect: Mood normal.        Behavior: Behavior normal.        Thought Content: Thought content normal.      Assessment:  Pleasant 69 year old male who  presents with complaints  of diarrhea, weight loss, GERD, poor appetite.  He states his diarrhea was worse and seems to getting better.    Diarrhea: Previously having 5 stools a day that were all liquid, now having about 3 stools a day with some stools liquid.  Possible colitis/gastroenteritis, food intolerance, food poisoning.  Less likely microscopic colitis do to self resolution.  At this point I have recommended stool studies to identify any occult infection.  Start a probiotic after collecting stool studies samples.  Further recommendations to follow.  We will request blood work recently completed by primary care  GERD: Symptoms doing well on PPI.  No overt or acute changes  Poor appetite and weight loss: I feel these are likely variable link.  He is only eating about 1 meal a day.  Objectively his weight is stable over the past 8 months based on our office visits.  However, he feels he has lost significant weight.  He has not previously had a colonoscopy or endoscopy.  At this point I discussed the need to "eat to live".  I recommended he start taking Ensure or other protein supplement twice a day to maintain adequate nutrition.  Request previous blood work from primary care recently done.  I will set him up for colonoscopy and endoscopy for evaluation of unintentional weight loss and due to no previous endoscopic evaluation.  Further recommendations will follow.  Proceed with colonoscopy and EGD on propofol/MAC by Dr. Gala Romney in near future: the risks, benefits, and alternatives have been discussed with the patient in detail. The patient states understanding and desires to proceed.  The patient is currently on Levemir, Januvia. The patient is not on any other anticoagulants, anxiolytics, chronic pain medications, antidepressants, antidiabetics, or iron supplements.  Admits significant alcohol intake of 1 bottle of wine a day.  We will plan for the procedure on propofol/MAC to promote adequate sedation.   We will adjust his diabetes medications appropriately.  ASA III  Plan: 1. Colonoscopy and EGD on propofol/MAC 2. C. difficile and GI pathogen panel 3. Ensure supplementation twice a day 4. Request blood work from primary care 5. Start probiotic for the next 1 to 2 months at minimum 6. Follow-up in 2 months    Thank you for allowing [place] to participate in the care of Dylan Net, DNP, AGNP-C Adult & Gerontological Nurse Practitioner [facility]   07/01/2020 4:21 PM   Disclaimer: This note was dictated with voice recognition software. Similar sounding words can inadvertently be transcribed and may not be corrected upon review.

## 2020-07-02 LAB — GASTROINTESTINAL PATHOGEN PANEL PCR
C. difficile Tox A/B, PCR: NOT DETECTED
Campylobacter, PCR: NOT DETECTED
Cryptosporidium, PCR: NOT DETECTED
E coli (ETEC) LT/ST PCR: NOT DETECTED
E coli (STEC) stx1/stx2, PCR: NOT DETECTED
E coli 0157, PCR: NOT DETECTED
Giardia lamblia, PCR: NOT DETECTED
Norovirus, PCR: NOT DETECTED
Rotavirus A, PCR: NOT DETECTED
Salmonella, PCR: NOT DETECTED
Shigella, PCR: NOT DETECTED

## 2020-07-02 LAB — C. DIFFICILE GDH AND TOXIN A/B
GDH ANTIGEN: NOT DETECTED
MICRO NUMBER:: 11018723
SPECIMEN QUALITY:: ADEQUATE
TOXIN A AND B: NOT DETECTED

## 2020-07-12 NOTE — Patient Instructions (Signed)
Dylan Cline  07/12/2020     @PREFPERIOPPHARMACY @   Your procedure is scheduled on  07/16/2020.  Report to [facility] at  0800  A.M.  Call this number if you have problems the morning of surgery:  [phone]   Remember:  Do not eat or drink after midnight.                        Take these medicines the morning of surgery with A SIP OF WATER  Amlodipine, prilosec, flomax. Take 1/2 of your night time insulin(12.5 units) the night before your insulin. DO NOT take any medications for diabetes the morning of your procedure.    Do not wear jewelry, make-up or nail polish.  Do not wear lotions, powders, or perfumes. Please wear deodorant and brush your teeth.  Do not shave 48 hours prior to surgery.  Men may shave face and neck.  Do not bring valuables to the hospital.  [facility] is not responsible for any belongings or valuables.  Contacts, dentures or bridgework may not be worn into surgery.  Leave your suitcase in the car.  After surgery it may be brought to your room.  For patients admitted to the hospital, discharge time will be determined by your treatment team.  Patients discharged the day of surgery will not be allowed to drive home.   Name and phone number of your driver:   family Special instructions:   DO NOT smoke the morning of your procedure.  Please read over the following fact sheets that you were given. Anesthesia Post-op Instructions and Care and Recovery After Surgery      Cataract Surgery, Care After This sheet gives you information about how to care for yourself after your procedure. Your health care provider may also give you more specific instructions. If you have problems or questions, contact your health care provider. What can I expect after the procedure? After the procedure, it is common to have:  Itching.  Discomfort.  Fluid discharge.  Sensitivity to light and to touch.  Bruising in or around the eye.  Mild blurred  vision. Follow these instructions at home: Eye care   Do not touch or rub your eyes.  Protect your eyes as told by your health care provider. You may be told to wear a protective eye shield or sunglasses.  Do not put a contact lens into the affected eye or eyes until your health care provider approves.  Keep the area around your eye clean and dry: ? Avoid swimming. ? Do not allow water to hit you directly in the face while showering. ? Keep soap and shampoo out of your eyes.  Check your eye every day for signs of infection. Watch for: ? Redness, swelling, or pain. ? Fluid, blood, or pus. ? Warmth. ? A bad smell. ? Vision that is getting worse. ? Sensitivity that is getting worse. Activity  Do not drive for 24 hours if you were given a sedative during your procedure.  Avoid strenuous activities, such as playing contact sports, for as long as told by your health care provider.  Do not drive or use heavy machinery until your health care provider approves.  Do not bend or lift heavy objects. Bending increases pressure in the eye. You can walk, climb stairs, and do light household chores.  Ask your health care provider when you can return to work. If you work in a dusty environment, you  may be advised to wear protective eyewear for a period of time. General instructions  Take or apply over-the-counter and prescription medicines only as told by your health care provider. This includes eye drops.  Keep all follow-up visits as told by your health care provider. This is important. Contact a health care provider if:  You have increased bruising around your eye.  You have pain that is not helped with medicine.  You have a fever.  You have redness, swelling, or pain in your eye.  You have fluid, blood, or pus coming from your incision.  Your vision gets worse.  Your sensitivity to light gets worse. Get help right away if:  You have sudden loss of vision.  You see flashes  of light or spots (floaters).  You have severe eye pain.  You develop nausea or vomiting. Summary  After your procedure, it is common to have itching, discomfort, bruising, fluid discharge, or sensitivity to light.  Follow instructions from your health care provider about caring for your eye after the procedure.  Do not rub your eye after the procedure. You may need to wear eye protection or sunglasses. Do not wear contact lenses. Keep the area around your eye clean and dry.  Avoid activities that require a lot of effort. These include playing sports and lifting heavy objects.  Contact a health care provider if you have increased bruising, pain that does not go away, or a fever. Get help right away if you suddenly lose your vision, see flashes of light or spots, or have severe pain in the eye. This information is not intended to replace advice given to you by your health care provider. Make sure you discuss any questions you have with your health care provider. Document Revised: 07/15/2019 Document Reviewed: 03/18/2018 Elsevier Patient Education  [place].

## 2020-07-14 ENCOUNTER — Other Ambulatory Visit (HOSPITAL_COMMUNITY): Payer: Medicare HMO

## 2020-07-14 ENCOUNTER — Encounter (HOSPITAL_COMMUNITY)
Admission: RE | Admit: 2020-07-14 | Discharge: 2020-07-14 | Disposition: A | Discharge status: Home / Self Care | Payer: Medicare HMO | Source: Ambulatory Visit | Attending: Ophthalmology | Admitting: Ophthalmology

## 2020-07-20 NOTE — H&P (Signed)
Surgical History & Physical  Patient Name: Dylan Cline DOB: 21-Nov-1950  Surgery: Cataract extraction with intraocular lens implant phacoemulsification; Left Eye  Surgeon: [name] MD Surgery Date:  07/26/2020 Pre-Op Date:  07/14/2020  HPI: A 14 Yr. old male patient is here for cataract re-eval OS. 1. 1. The patient complains of difficulty when viewing TV, reading closed caption, news scrolls on TV, which began 1 year ago. The left eye is affected. The episode is gradual. The condition's severity increased since last visit. Symptoms occur when the patient is inside and outside. This is negatively affecting the patient's quality of life. HPI was performed by [name] .  Medical History: Phaco/IOL OD (2021, Dylan Cline) Cataract OS Diabetes High Blood Pressure LDL  Review of Systems Negative Allergic/Immunologic Negative Cardiovascular Negative Constitutional Negative Ear, Nose, Mouth & Throat Negative Endocrine Negative Eyes Negative Gastrointestinal Negative Genitourinary Negative Hemotologic/Lymphatic Negative Integumentary Negative Musculoskeletal Negative Neurological Negative Psychiatry Negative Respiratory  Social   Current every day smoker   Medication Moxifloxacin, Ilevro, Prednisolone acetate 1%,  Amlodipine Besylate, Atorvastatin, HCTZ, Januvia, Levemir, Prednisolone acetate,   Sx/Procedures Phaco c IOL OD,  Neck cyst removal,   Drug Allergies   NKDA  History & Physical: Heent:  Cataract, Left eye NECK: supple without bruits LUNGS: lungs clear to auscultation CV: regular rate and rhythm Abdomen: soft and non-tender  Impression & Plan: Assessment: 1.  COMBINED FORMS AGE RELATED CATARACT; Left Eye (H25.812) 2.  BLEPHARITIS; Right Lower Lid, Left Upper Lid, Left Lower Lid, Right Upper Lid (H01.001, H01.002,H01.004,H01.005) 3.  INTRAOCULAR LENS IOL (Z96.1) 4.  Pinguecula; Both Eyes (H11.153) 5.  ARCUS SENILIS; Both Eyes (H18.413)  Plan: 1.   Cataract accounts for the patient's decreased vision. This visual impairment is not correctable with a tolerable change in glasses or contact lenses. Cataract surgery with an implantation of a new lens should significantly improve the visual and functional status of the patient. Discussed all risks, benefits, alternatives, and potential complications. Discussed the procedures and recovery. Patient desires to have surgery. A-scan ordered and performed today for intra-ocular lens calculations. The surgery will be performed in order to improve vision for driving, reading, and for eye examinations. Recommend phacoemulsification with intra-ocular lens. Left Eye Dilates poorly - shugacaine by protocol. Malyugin Ring in room. Omidria. Surgery required to correct imbalance of vision. 2.  Begin/continue lid scrubs. 3.  Stable. Doing well since surgery 4.  Observe; Artificial tears as needed for irritation. 5.  Discussed significance of finding

## 2020-07-21 NOTE — Patient Instructions (Signed)
Dylan Cline  07/21/2020     @PREFPERIOPPHARMACY @   Your procedure is scheduled on 07/26/2020.  Report to [facility] at  1230  P.M.  Call this number if you have problems the morning of surgery:  [phone]   Remember:  Do not eat or drink after midnight.                        Take these medicines the morning of surgery with A SIP OF WATER  Amlodipine, prilosec. Take 12.5 units of your levemir the night before your procedure. DO NOT take any medications for diabetes the morning of your procedure.    Do not wear jewelry, make-up or nail polish.  Do not wear lotions, powders, or perfumes. Please wear deodorant and brush your teeth.  Do not shave 48 hours prior to surgery.  Men may shave face and neck.  Do not bring valuables to the hospital.  [facility] is not responsible for any belongings or valuables.  Contacts, dentures or bridgework may not be worn into surgery.  Leave your suitcase in the car.  After surgery it may be brought to your room.  For patients admitted to the hospital, discharge time will be determined by your treatment team.  Patients discharged the day of surgery will not be allowed to drive home.   Name and phone number of your driver:   family Special instructions:  DO NOT smoke the morning of your procedure.  Please read over the following fact sheets that you were given. Anesthesia Post-op Instructions and Care and Recovery After Surgery      Cataract Surgery, Care After This sheet gives you information about how to care for yourself after your procedure. Your health care provider may also give you more specific instructions. If you have problems or questions, contact your health care provider. What can I expect after the procedure? After the procedure, it is common to have:  Itching.  Discomfort.  Fluid discharge.  Sensitivity to light and to touch.  Bruising in or around the eye.  Mild blurred vision. Follow these instructions  at home: Eye care   Do not touch or rub your eyes.  Protect your eyes as told by your health care provider. You may be told to wear a protective eye shield or sunglasses.  Do not put a contact lens into the affected eye or eyes until your health care provider approves.  Keep the area around your eye clean and dry: ? Avoid swimming. ? Do not allow water to hit you directly in the face while showering. ? Keep soap and shampoo out of your eyes.  Check your eye every day for signs of infection. Watch for: ? Redness, swelling, or pain. ? Fluid, blood, or pus. ? Warmth. ? A bad smell. ? Vision that is getting worse. ? Sensitivity that is getting worse. Activity  Do not drive for 24 hours if you were given a sedative during your procedure.  Avoid strenuous activities, such as playing contact sports, for as long as told by your health care provider.  Do not drive or use heavy machinery until your health care provider approves.  Do not bend or lift heavy objects. Bending increases pressure in the eye. You can walk, climb stairs, and do light household chores.  Ask your health care provider when you can return to work. If you work in a dusty environment, you may be advised to wear  protective eyewear for a period of time. General instructions  Take or apply over-the-counter and prescription medicines only as told by your health care provider. This includes eye drops.  Keep all follow-up visits as told by your health care provider. This is important. Contact a health care provider if:  You have increased bruising around your eye.  You have pain that is not helped with medicine.  You have a fever.  You have redness, swelling, or pain in your eye.  You have fluid, blood, or pus coming from your incision.  Your vision gets worse.  Your sensitivity to light gets worse. Get help right away if:  You have sudden loss of vision.  You see flashes of light or spots  (floaters).  You have severe eye pain.  You develop nausea or vomiting. Summary  After your procedure, it is common to have itching, discomfort, bruising, fluid discharge, or sensitivity to light.  Follow instructions from your health care provider about caring for your eye after the procedure.  Do not rub your eye after the procedure. You may need to wear eye protection or sunglasses. Do not wear contact lenses. Keep the area around your eye clean and dry.  Avoid activities that require a lot of effort. These include playing sports and lifting heavy objects.  Contact a health care provider if you have increased bruising, pain that does not go away, or a fever. Get help right away if you suddenly lose your vision, see flashes of light or spots, or have severe pain in the eye. This information is not intended to replace advice given to you by your health care provider. Make sure you discuss any questions you have with your health care provider. Document Revised: 07/15/2019 Document Reviewed: 03/18/2018 Elsevier Patient Education  [name]

## 2020-07-22 ENCOUNTER — Encounter (HOSPITAL_COMMUNITY)
Admission: RE | Admit: 2020-07-22 | Discharge: 2020-07-22 | Disposition: A | Discharge status: Home / Self Care | Payer: Medicare HMO | Source: Ambulatory Visit | Attending: Ophthalmology | Admitting: Ophthalmology

## 2020-07-22 ENCOUNTER — Telehealth: Payer: Self-pay | Admitting: *Deleted

## 2020-07-22 ENCOUNTER — Encounter (HOSPITAL_COMMUNITY): Payer: Self-pay

## 2020-07-22 ENCOUNTER — Other Ambulatory Visit: Payer: Self-pay

## 2020-07-22 ENCOUNTER — Other Ambulatory Visit (HOSPITAL_COMMUNITY)
Admission: RE | Admit: 2020-07-22 | Discharge: 2020-07-22 | Disposition: A | Discharge status: Home / Self Care | Payer: Medicare HMO | Source: Ambulatory Visit | Attending: Ophthalmology | Admitting: Ophthalmology

## 2020-07-22 DIAGNOSIS — Z20822 Contact with and (suspected) exposure to covid-19: Secondary | ICD-10-CM | POA: Diagnosis not present

## 2020-07-22 DIAGNOSIS — Z01818 Encounter for other preprocedural examination: Secondary | ICD-10-CM | POA: Insufficient documentation

## 2020-07-22 LAB — COMPREHENSIVE METABOLIC PANEL
ALT: 16 U/L (ref 0–44)
AST: 20 U/L (ref 15–41)
Albumin: 3.8 g/dL (ref 3.5–5.0)
Alkaline Phosphatase: 50 U/L (ref 38–126)
Anion gap: 13 (ref 5–15)
BUN: 19 mg/dL (ref 8–23)
CO2: 21 mmol/L — ABNORMAL LOW (ref 22–32)
Calcium: 8.6 mg/dL — ABNORMAL LOW (ref 8.9–10.3)
Chloride: 107 mmol/L (ref 98–111)
Creatinine, Ser: 2.3 mg/dL — ABNORMAL HIGH (ref 0.61–1.24)
GFR, Estimated: 30 mL/min — ABNORMAL LOW (ref >60)
Glucose, Bld: 117 mg/dL — ABNORMAL HIGH (ref 70–99)
Potassium: 3.4 mmol/L — ABNORMAL LOW (ref 3.5–5.1)
Sodium: 141 mmol/L (ref 135–145)
Total Bilirubin: 0.8 mg/dL (ref 0.3–1.2)
Total Protein: 6.9 g/dL (ref 6.5–8.1)

## 2020-07-22 NOTE — Telephone Encounter (Signed)
lmovm for pt to call back to schedule TCS/EGD w/ propofol, Dr. Gala Romney, ASA 3

## 2020-07-23 LAB — HEMOGLOBIN A1C
Hgb A1c MFr Bld: 6.4 % — ABNORMAL HIGH (ref 4.8–5.6)
Mean Plasma Glucose: 137 mg/dL

## 2020-07-23 LAB — SARS CORONAVIRUS 2 (TAT 6-24 HRS): SARS Coronavirus 2: NEGATIVE

## 2020-07-26 ENCOUNTER — Encounter (HOSPITAL_COMMUNITY): Payer: Self-pay | Admitting: Ophthalmology

## 2020-07-26 ENCOUNTER — Ambulatory Visit (HOSPITAL_COMMUNITY): Payer: Medicare HMO | Admitting: Anesthesiology

## 2020-07-26 ENCOUNTER — Encounter (HOSPITAL_COMMUNITY)
Admission: RE | Disposition: A | Discharge status: Home / Self Care | Payer: Self-pay | Source: Home / Self Care | Attending: Ophthalmology

## 2020-07-26 ENCOUNTER — Ambulatory Visit (HOSPITAL_COMMUNITY)
Admission: RE | Admit: 2020-07-26 | Discharge: 2020-07-26 | Disposition: A | Discharge status: Home / Self Care | Payer: Medicare HMO | Attending: Ophthalmology | Admitting: Ophthalmology

## 2020-07-26 DIAGNOSIS — H2181 Floppy iris syndrome: Secondary | ICD-10-CM | POA: Insufficient documentation

## 2020-07-26 DIAGNOSIS — H25812 Combined forms of age-related cataract, left eye: Secondary | ICD-10-CM | POA: Diagnosis present

## 2020-07-26 DIAGNOSIS — Z79899 Other long term (current) drug therapy: Secondary | ICD-10-CM | POA: Diagnosis not present

## 2020-07-26 DIAGNOSIS — F17200 Nicotine dependence, unspecified, uncomplicated: Secondary | ICD-10-CM | POA: Insufficient documentation

## 2020-07-26 DIAGNOSIS — H18413 Arcus senilis, bilateral: Secondary | ICD-10-CM | POA: Diagnosis not present

## 2020-07-26 DIAGNOSIS — H0100B Unspecified blepharitis left eye, upper and lower eyelids: Secondary | ICD-10-CM | POA: Insufficient documentation

## 2020-07-26 DIAGNOSIS — E1136 Type 2 diabetes mellitus with diabetic cataract: Secondary | ICD-10-CM | POA: Diagnosis not present

## 2020-07-26 DIAGNOSIS — I10 Essential (primary) hypertension: Secondary | ICD-10-CM | POA: Insufficient documentation

## 2020-07-26 DIAGNOSIS — Z794 Long term (current) use of insulin: Secondary | ICD-10-CM | POA: Insufficient documentation

## 2020-07-26 DIAGNOSIS — H0100A Unspecified blepharitis right eye, upper and lower eyelids: Secondary | ICD-10-CM | POA: Diagnosis not present

## 2020-07-26 DIAGNOSIS — E7800 Pure hypercholesterolemia, unspecified: Secondary | ICD-10-CM | POA: Insufficient documentation

## 2020-07-26 DIAGNOSIS — H11153 Pinguecula, bilateral: Secondary | ICD-10-CM | POA: Insufficient documentation

## 2020-07-26 HISTORY — PX: CATARACT EXTRACTION W/PHACO: SHX586

## 2020-07-26 LAB — GLUCOSE, CAPILLARY: Glucose-Capillary: 99 mg/dL (ref 70–99)

## 2020-07-26 SURGERY — PHACOEMULSIFICATION, CATARACT, WITH IOL INSERTION
Anesthesia: Monitor Anesthesia Care | Site: Eye | Laterality: Left

## 2020-07-26 MED ORDER — LIDOCAINE HCL 3.5 % OP GEL
OPHTHALMIC | Status: AC
  Filled 2020-07-26: qty 1

## 2020-07-26 MED ORDER — PHENYLEPHRINE HCL 2.5 % OP SOLN
1.0000 [drp] | OPHTHALMIC | Status: AC | PRN
  Administered 2020-07-26 (×3): 1 [drp] via OPHTHALMIC

## 2020-07-26 MED ORDER — NEOMYCIN-POLYMYXIN-DEXAMETH 3.5-10000-0.1 OP SUSP
OPHTHALMIC | Status: DC | PRN
  Administered 2020-07-26: 1 [drp] via OPHTHALMIC

## 2020-07-26 MED ORDER — BSS IO SOLN
INTRAOCULAR | Status: DC | PRN
  Administered 2020-07-26: 15 mL via INTRAOCULAR

## 2020-07-26 MED ORDER — LIDOCAINE HCL 3.5 % OP GEL
1.0000 "application " | Freq: Once | OPHTHALMIC | Status: AC
  Administered 2020-07-26: 1 via OPHTHALMIC

## 2020-07-26 MED ORDER — PHENYLEPHRINE-KETOROLAC 1-0.3 % IO SOLN
INTRAOCULAR | Status: DC | PRN
  Administered 2020-07-26: 500 mL via OPHTHALMIC

## 2020-07-26 MED ORDER — CYCLOPENTOLATE-PHENYLEPHRINE 0.2-1 % OP SOLN
1.0000 [drp] | OPHTHALMIC | Status: AC | PRN
  Administered 2020-07-26 (×3): 1 [drp] via OPHTHALMIC

## 2020-07-26 MED ORDER — MIDAZOLAM HCL 2 MG/2ML IJ SOLN
INTRAMUSCULAR | Status: AC
  Filled 2020-07-26: qty 2

## 2020-07-26 MED ORDER — POVIDONE-IODINE 5 % OP SOLN
OPHTHALMIC | Status: DC | PRN
  Administered 2020-07-26: 1 via OPHTHALMIC

## 2020-07-26 MED ORDER — SODIUM HYALURONATE 23 MG/ML IO SOLN
INTRAOCULAR | Status: DC | PRN
  Administered 2020-07-26: 0.6 mL via INTRAOCULAR

## 2020-07-26 MED ORDER — LIDOCAINE HCL (PF) 1 % IJ SOLN
INTRAOCULAR | Status: DC | PRN
  Administered 2020-07-26: 1 mL via OPHTHALMIC

## 2020-07-26 MED ORDER — TETRACAINE HCL 0.5 % OP SOLN
1.0000 [drp] | OPHTHALMIC | Status: AC | PRN
  Administered 2020-07-26 (×3): 1 [drp] via OPHTHALMIC

## 2020-07-26 MED ORDER — PROVISC 10 MG/ML IO SOLN
INTRAOCULAR | Status: DC | PRN
  Administered 2020-07-26: 0.85 mL via INTRAOCULAR

## 2020-07-26 MED ORDER — MIDAZOLAM HCL 2 MG/2ML IJ SOLN
INTRAMUSCULAR | Status: DC | PRN
  Administered 2020-07-26: 1 mg via INTRAVENOUS

## 2020-07-26 SURGICAL SUPPLY — 18 items
CLOTH BEACON ORANGE TIMEOUT ST (SAFETY) ×1 IMPLANT
EYE SHIELD UNIVERSAL CLEAR (GAUZE/BANDAGES/DRESSINGS) ×1 IMPLANT
GLOVE BIOGEL PI IND STRL 7.0 (GLOVE) IMPLANT
GLOVE BIOGEL PI IND STRL 7.5 (GLOVE) IMPLANT
GLOVE BIOGEL PI INDICATOR 7.0 (GLOVE) ×1
GLOVE BIOGEL PI INDICATOR 7.5 (GLOVE) ×2
GOWN STRL REUS W/ TWL XL LVL3 (GOWN DISPOSABLE) IMPLANT
GOWN STRL REUS W/TWL XL LVL3 (GOWN DISPOSABLE) ×2
LENS ALC ACRYL/TECN (Ophthalmic Related) ×1 IMPLANT
NDL HYPO 18GX1.5 BLUNT FILL (NEEDLE) IMPLANT
NEEDLE HYPO 18GX1.5 BLUNT FILL (NEEDLE) ×2 IMPLANT
PAD ARMBOARD 7.5X6 YLW CONV (MISCELLANEOUS) ×1 IMPLANT
RING MALYGIN 7.0 (MISCELLANEOUS) IMPLANT
SYR TB 1ML LL NO SAFETY (SYRINGE) ×1 IMPLANT
TAPE SURG TRANSPORE 1 IN (GAUZE/BANDAGES/DRESSINGS) IMPLANT
TAPE SURGICAL TRANSPORE 1 IN (GAUZE/BANDAGES/DRESSINGS) ×2
VISCOELASTIC ADDITIONAL (OPHTHALMIC RELATED) IMPLANT
WATER STERILE IRR 250ML POUR (IV SOLUTION) ×1 IMPLANT

## 2020-07-26 NOTE — Transfer of Care (Signed)
Immediate Anesthesia Transfer of Care Note  Patient: Dylan Cline  Procedure(s) Performed: CATARACT EXTRACTION PHACO AND INTRAOCULAR LENS PLACEMENT (IOC) (Left Eye)  Patient Location: Short Stay  Anesthesia Type:MAC  Level of Consciousness: awake, alert  and oriented  Airway & Oxygen Therapy: Patient Spontanous Breathing  Post-op Assessment: Report given to RN, Post -op Vital signs reviewed and stable and Patient moving all extremities X 4  Post vital signs: Reviewed and stable  Last Vitals:  Vitals Value Taken Time  BP    Temp    Pulse    Resp    SpO2      Last Pain:  Vitals:   07/26/20 1444  TempSrc: Oral  PainSc: 0-No pain         Complications: No complications documented.

## 2020-07-26 NOTE — Anesthesia Postprocedure Evaluation (Signed)
Anesthesia Post Note  Patient: Dylan Cline  Procedure(s) Performed: CATARACT EXTRACTION PHACO AND INTRAOCULAR LENS PLACEMENT (IOC) (Left Eye)  Patient location during evaluation: Phase II Anesthesia Type: MAC Level of consciousness: oriented and awake and alert Pain management: pain level controlled Vital Signs Assessment: post-procedure vital signs reviewed and stable Respiratory status: spontaneous breathing, nonlabored ventilation and respiratory function stable Cardiovascular status: blood pressure returned to baseline Postop Assessment: no apparent nausea or vomiting Anesthetic complications: no   No complications documented.   Last Vitals:  Vitals:   07/26/20 1444 07/26/20 1522  BP: (!) 160/75 (!) 148/84  Pulse: (!) 57 62  Resp: 15 18  Temp: 36.5 C 36.6 C  SpO2: 99% 100%    Last Pain:  Vitals:   07/26/20 1522  TempSrc: Oral  PainSc: 0-No pain                 Orlie Dakin

## 2020-07-26 NOTE — Anesthesia Procedure Notes (Signed)
Procedure Name: MAC Date/Time: 07/26/2020 3:02 PM Performed by: Orlie Dakin, CRNA Pre-anesthesia Checklist: Patient identified, Emergency Drugs available, Suction available and Patient being monitored Patient Re-evaluated:Patient Re-evaluated prior to induction Oxygen Delivery Method: Nasal cannula Placement Confirmation: positive ETCO2

## 2020-07-26 NOTE — Op Note (Signed)
Date of procedure: 07/26/20  Pre-operative diagnosis: Visually significant combined-form cataract, Left Eye; Poor dilation, Left eye (H25.812; H21.81)  Post-operative diagnosis: Visually significant cataract, Left Eye; Intra-operative Floppy Iris Syndrome, Left Eye  Procedure: Complex removal of cataract via phacoemulsification and insertion of intra-ocular lens [employer] DCB00  +20.5D into the capsular bag of the Left Eye (CPT [phone])  Attending surgeon: Gerda Diss. Raianna Slight, MD, MA  Anesthesia: MAC, Topical Akten  Complications: None  Estimated Blood Loss: <43m (minimal)  Specimens: None  Implants: As above  Indications:  Visually significant cataract, Left Eye  Procedure:  The patient was seen and identified in the pre-operative area. The operative eye was identified and dilated.  The operative eye was marked.  Topical anesthesia was administered to the operative eye.     The patient was then to the operative suite and placed in the supine position.  A timeout was performed confirming the patient, procedure to be performed, and all other relevant information.   The patient's face was prepped and draped in the usual fashion for intra-ocular surgery.  A lid speculum was placed into the operative eye and the surgical microscope moved into place and focused.  Poor dilation of the iris was confirmed.  An inferotemporal paracentesis was created using a 20 gauge paracentesis blade.  Shugarcaine was injected into the anterior chamber.  Viscoelastic was injected into the anterior chamber.  A temporal clear-corneal main wound incision was created using a 2.477mmicrokeratome.  A Malyugin ring was placed.  A continuous curvilinear capsulorrhexis was initiated using an irrigating cystitome and completed using capsulorrhexis forceps.  Hydrodissection and hydrodeliniation were performed.  Viscoelastic was injected into the anterior chamber.  A phacoemulsification handpiece and a chopper as a  second instrument were used to remove the nucleus and epinucleus. The irrigation/aspiration handpiece was used to remove any remaining cortical material.   The capsular bag was reinflated with viscoelastic, checked, and found to be intact.  The intraocular lens was inserted into the capsular bag and dialed into place using a Kuglen hook. The Malyugin ring was removed.  The irrigation/aspiration handpiece was used to remove any remaining viscoelastic.  The clear corneal wound and paracentesis wounds were then hydrated and checked with Weck-Cels to be watertight.  The lid-speculum and drape was removed, and the patient's face was cleaned with a wet and dry 4x4.  Maxitrol was instilled in the eye before a clear shield was taped over the eye. The patient was taken to the post-operative care unit in good condition, having tolerated the procedure well.  Post-Op Instructions: [facility] and will receive all other orders and instructions.

## 2020-07-26 NOTE — Interval H&P Note (Signed)
History and Physical Interval Note:  07/26/2020 2:55 PM  Dylan Cline  has presented today for surgery, with the diagnosis of Nuclear sclerotic cataract - Left eye.  The various methods of treatment have been discussed with the patient and family. After consideration of risks, benefits and other options for treatment, the patient has consented to  Procedure(s) with comments: CATARACT EXTRACTION PHACO AND INTRAOCULAR LENS PLACEMENT ([place]) (Left) - left as a surgical intervention.  The patient's history has been reviewed, patient examined, no change in status, stable for surgery.  I have reviewed the patient's chart and labs.  Questions were answered to the patient's satisfaction.     [name]

## 2020-07-26 NOTE — Discharge Instructions (Addendum)
Please discharge patient when stable, will follow up today with Dr. Wrzosek at the [facility] office immediately following discharge.  Leave shield in place until visit.  All paperwork with discharge instructions will be given at the office.   [facility] Address:  [address]  , [place] [address]             Monitored Anesthesia Care, Care After These instructions provide you with information about caring for yourself after your procedure. Your health care provider may also give you more specific instructions. Your treatment has been planned according to current medical practices, but problems sometimes occur. Call your health care provider if you have any problems or questions after your procedure. What can I expect after the procedure? After your procedure, you may:  Feel sleepy for several hours.  Feel clumsy and have poor balance for several hours.  Feel forgetful about what happened after the procedure.  Have poor judgment for several hours.  Feel nauseous or vomit.  Have a sore throat if you had a breathing tube during the procedure. Follow these instructions at home: For at least 24 hours after the procedure:      Have a responsible adult stay with you. It is important to have someone help care for you until you are awake and alert.  Rest as needed.  Do not: ? Participate in activities in which you could fall or become injured. ? Drive. ? Use heavy machinery. ? Drink alcohol. ? Take sleeping pills or medicines that cause drowsiness. ? Make important decisions or sign legal documents. ? Take care of children on your own. Eating and drinking  Follow the diet that is recommended by your health care provider.  If you vomit, drink water, juice, or soup when you can drink without vomiting.  Make sure you have little or no nausea before eating solid foods. General instructions  Take over-the-counter and  prescription medicines only as told by your health care provider.  If you have sleep apnea, surgery and certain medicines can increase your risk for breathing problems. Follow instructions from your health care provider about wearing your sleep device: ? Anytime you are sleeping, including during daytime naps. ? While taking prescription pain medicines, sleeping medicines, or medicines that make you drowsy.  If you smoke, do not smoke without supervision.  Keep all follow-up visits as told by your health care provider. This is important. Contact a health care provider if:  You keep feeling nauseous or you keep vomiting.  You feel light-headed.  You develop a rash.  You have a fever. Get help right away if:  You have trouble breathing. Summary  For several hours after your procedure, you may feel sleepy and have poor judgment.  Have a responsible adult stay with you for at least 24 hours or until you are awake and alert. This information is not intended to replace advice given to you by your health care provider. Make sure you discuss any questions you have with your health care provider. Document Revised: 12/17/2017 Document Reviewed: 01/09/2016 Elsevier Patient Education  2020 [employer]  

## 2020-07-26 NOTE — Anesthesia Preprocedure Evaluation (Signed)
Anesthesia Evaluation  Patient identified by MRN, date of birth, ID band Patient awake    Reviewed: Allergy & Precautions, NPO status , Patient's Chart, lab work & pertinent test results  History of Anesthesia Complications Negative for: history of anesthetic complications  Airway Mallampati: II  TM Distance: >3 FB Neck ROM: Full    Dental  (+) Dental Advisory Given, Missing   Pulmonary Current Smoker and Patient abstained from smoking.,  Multiple pulmonary nodules    Pulmonary exam normal breath sounds clear to auscultation       Cardiovascular Exercise Tolerance: Good hypertension, Pt. on medications  Rhythm:Regular Rate:Normal     Neuro/Psych PSYCHIATRIC DISORDERS    GI/Hepatic GERD  Medicated,(+)     substance abuse  alcohol use, marijuana use and methamphetamine use,   Endo/Other  diabetes, Well Controlled, Type 2, Oral Hypoglycemic Agents  Renal/GU Renal InsufficiencyRenal disease     Musculoskeletal negative musculoskeletal ROS (+)   Abdominal   Peds  Hematology negative hematology ROS (+)   Anesthesia Other Findings   Reproductive/Obstetrics negative OB ROS                             Anesthesia Physical Anesthesia Plan  ASA: III  Anesthesia Plan: MAC   Post-op Pain Management:    Induction:   PONV Risk Score and Plan:   Airway Management Planned: Nasal Cannula and Natural Airway  Additional Equipment:   Intra-op Plan:   Post-operative Plan:   Informed Consent: I have reviewed the patients History and Physical, chart, labs and discussed the procedure including the risks, benefits and alternatives for the proposed anesthesia with the patient or authorized representative who has indicated his/her understanding and acceptance.     Dental advisory given  Plan Discussed with: CRNA and Surgeon  Anesthesia Plan Comments:         Anesthesia Quick  Evaluation

## 2020-07-27 NOTE — Telephone Encounter (Signed)
LMOVM for pt. Letter mailed. 

## 2020-07-28 ENCOUNTER — Encounter (HOSPITAL_COMMUNITY): Payer: Self-pay | Admitting: Ophthalmology

## 2020-09-02 ENCOUNTER — Ambulatory Visit: Payer: Medicare HMO | Admitting: Nurse Practitioner

## 2020-09-02 ENCOUNTER — Encounter: Payer: Self-pay | Admitting: Internal Medicine

## 2020-09-02 NOTE — Progress Notes (Deleted)
Referring Provider: No ref. provider found Primary Care Physician:  Celene Squibb, MD Primary GI:  Dr. Gala Romney  No chief complaint on file.   HPI:   Dylan Cline is a 69 y.o. male who presents for follow-up on diarrhea and GERD.  The patient was last seen in our office 07/01/2020 for the same as well as poor appetite and weight loss.  Noted history of alcohol induced pancreatitis, weight loss, abdominal pain, GERD.  History of pancreatic pseudocyst that underwent FNA at [facility] and repeat CT which noted decreased size and no new findings.  Previous GERD symptoms improved on omeprazole.  History of admission for acute kidney failure in August 2020.  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  He subsequently underwent a left lateral parotidectomy with facial nerve dissection.  At his last visit began having diarrhea 3 weeks prior initially watery stools every bowel movement but now three quarters of his stools or half of his stools are watery, 1 stool a day that is declined from 3-4 stools a day.  No obvious sources of contamination or healthcare exposures.  No associated abdominal pain.  Noted poor appetite with a subjective weight loss of 30 pounds in 6 weeks that has begun improving since he started eating better.  GERD still doing well on Prilosec.  Objectively his weight was increased 2 pounds since December 2020.  Has never had a colonoscopy.  Recommended colonoscopy and EGD, C. difficile and GI pathogen panel, Ensure supplementation, request primary care blood work, start probiotic, follow-up in 2 months.  On 07/22/2020 we called left a message requesting a call back to schedule colonoscopy and EGD.  We have not heard back from the patient since.  He did undergo cataract extraction 07/26/2020.  Today states doing okay overall.  Past Medical  History:  Diagnosis Date  . Acute type 2 diabetes mellitus with manifestations (HCC)    no meds currently  . CRI (chronic renal insufficiency), stage 3 (moderate) (HCC)   . GERD (gastroesophageal reflux disease)   . Hypertension   . Mass of left parotid gland   . Pancreatitis   . Pancreatitis, alcoholic, acute 57/05/4695  . Smoker     Past Surgical History:  Procedure Laterality Date  . CATARACT EXTRACTION W/PHACO Right 11/24/2019   Procedure: CATARACT EXTRACTION PHACO AND INTRAOCULAR LENS PLACEMENT (IOC);  Surgeon: [name], MD;  Location: [facility];  Service: Ophthalmology;  Laterality: Right;  CDE: 4.96  . CATARACT EXTRACTION W/PHACO Left 07/26/2020   Procedure: CATARACT EXTRACTION PHACO AND INTRAOCULAR LENS PLACEMENT (IOC);  Surgeon: [name], MD;  Location: [facility];  Service: Ophthalmology;  Laterality: Left;  CDE: 6.94  . COLONOSCOPY   01/27/2005   EXB:MWUXLKGM hemorrhoids, otherwise normal rectum/Normal colon, marginal prep made the exam more difficult  . ESOPHAGOGASTRODUODENOSCOPY   10/12/2004   WNU:UVOZDG esophagus/Focal area of submucosal petechial hemorrhage in the fundus (likely a trauma-induced phenomenon secondary to vomiting), otherwise normal  . LAPAROSCOPIC GASTROTOMY W/ REPAIR OF ULCER    . PAROTIDECTOMY Left 07/29/2019   Procedure: LEFT PAROTIDECTOMY;  Surgeon: Leta Baptist, MD;  Location: [facility];  Service: ENT;  Laterality: Left;    Current Outpatient Medications  Medication Sig Dispense Refill  . amLODipine (NORVASC) 2.5 MG tablet Take 1 tablet (2.5 mg total) by mouth daily. 30 tablet 1  .  atorvastatin (LIPITOR) 40 MG tablet Take 40 mg by mouth daily.    Marland Kitchen JANUVIA 50 MG tablet Take 50 mg by mouth daily.    Marland Kitchen LEVEMIR FLEXTOUCH 100 UNIT/ML Pen Inject 25 Units into the skin at bedtime.     Marland Kitchen omeprazole (PRILOSEC) 40 MG capsule Take 1 capsule (40 mg total) by mouth 2 (two) times daily. (Patient taking differently: Take 40 mg by mouth daily. )  180 capsule 3  . tamsulosin (FLOMAX) 0.4 MG CAPS capsule Take 0.4 mg by mouth at bedtime.     No current facility-administered medications for this visit.    Allergies as of 09/02/2020  . (No Known Allergies)    Family History  Problem Relation Age of Onset  . Diabetes Mother   . Diabetes Father   . Heart attack Maternal Grandfather   . Cancer Paternal Grandmother   . Heart attack Maternal Uncle   . Cancer Maternal Aunt   . Cancer Maternal Uncle   . Colon cancer Neg Hx   . Pancreatic cancer Neg Hx   . Stomach cancer Neg Hx     Social History   Socioeconomic History  . Marital status: Married    Spouse name: Not on file  . Number of children: Not on file  . Years of education: Not on file  . Highest education level: Not on file  Occupational History  . Not on file  Tobacco Use  . Smoking status: Current Every Day Smoker    Packs/day: 1.00    Years: 40.00    Pack years: 40.00    Types: Cigarettes    Start date: 12/01/1970  . Smokeless tobacco: Never Used  Vaping Use  . Vaping Use: Never used  Substance and Sexual Activity  . Alcohol use: Yes    Alcohol/week: 0.0 standard drinks    Comment: 07/01/20: drinks a bottle of wine a day  . Drug use: Yes    Types: Methamphetamines, Marijuana    Comment: occasionally to stimulate appetite  . Sexual activity: Not on file  Other Topics Concern  . Not on file  Social History Narrative  . Not on file   Social Determinants of Health   Financial Resource Strain:   . Difficulty of Paying Living Expenses: Not on file  Food Insecurity:   . Worried About Charity fundraiser in the Last Year: Not on file  . Ran Out of Food in the Last Year: Not on file  Transportation Needs:   . Lack of Transportation (Medical): Not on file  . Lack of Transportation (Non-Medical): Not on file  Physical Activity:   . Days of Exercise per Week: Not on file  . Minutes of Exercise per Session: Not on file  Stress:   . Feeling of Stress : Not  on file  Social Connections:   . Frequency of Communication with Friends and Family: Not on file  . Frequency of Social Gatherings with Friends and Family: Not on file  . Attends Religious Services: Not on file  . Active Member of Clubs or Organizations: Not on file  . Attends Archivist Meetings: Not on file  . Marital Status: Not on file    Subjective:*** Review of Systems  Constitutional: Negative for chills, fever, malaise/fatigue and weight loss.  HENT: Negative for congestion and sore throat.   Respiratory: Negative for cough and shortness of breath.   Cardiovascular: Negative for chest pain and palpitations.  Gastrointestinal: Negative for abdominal pain, blood in  stool, diarrhea, melena, nausea and vomiting.  Musculoskeletal: Negative for joint pain and myalgias.  Skin: Negative for rash.  Neurological: Negative for dizziness and weakness.  Endo/Heme/Allergies: Does not bruise/bleed easily.  Psychiatric/Behavioral: Negative for depression. The patient is not nervous/anxious.   All other systems reviewed and are negative.    Objective: There were no vitals taken for this visit. Physical Exam Vitals and nursing note reviewed.  Constitutional:      General: He is not in acute distress.    Appearance: Normal appearance. He is not ill-appearing, toxic-appearing or diaphoretic.  HENT:     Head: Normocephalic and atraumatic.     Nose: No congestion or rhinorrhea.  Eyes:     General: No scleral icterus. Cardiovascular:     Rate and Rhythm: Normal rate and regular rhythm.     Heart sounds: Normal heart sounds.  Pulmonary:     Effort: Pulmonary effort is normal.     Breath sounds: Normal breath sounds.  Abdominal:     General: Bowel sounds are normal. There is no distension.     Palpations: Abdomen is soft. There is no hepatomegaly, splenomegaly or mass.     Tenderness: There is no abdominal tenderness. There is no guarding or rebound.     Hernia: No hernia is  present.  Musculoskeletal:     Cervical back: Neck supple.  Skin:    General: Skin is warm and dry.     Coloration: Skin is not jaundiced.     Findings: No bruising or rash.  Neurological:     General: No focal deficit present.     Mental Status: He is alert and oriented to person, place, and time. Mental status is at baseline.  Psychiatric:        Mood and Affect: Mood normal.        Behavior: Behavior normal.        Thought Content: Thought content normal.      Assessment:  ***   Plan: ***    Thank you for allowing [place] to participate in the care of Gaetano Net, DNP, AGNP-C Adult & Gerontological Nurse Practitioner [facility]   09/02/2020 11:01 AM   Disclaimer: This note was dictated with voice recognition software. Similar sounding words can inadvertently be transcribed and may not be corrected upon review.

## 2020-09-06 ENCOUNTER — Ambulatory Visit (HOSPITAL_COMMUNITY): Admission: RE | Admit: 2020-09-06 | Payer: Medicare HMO | Source: Ambulatory Visit

## 2020-09-08 DIAGNOSIS — Z0100 Encounter for examination of eyes and vision without abnormal findings: Secondary | ICD-10-CM | POA: Diagnosis not present

## 2020-10-15 DIAGNOSIS — E08319 Diabetes mellitus due to underlying condition with unspecified diabetic retinopathy without macular edema: Secondary | ICD-10-CM | POA: Diagnosis not present

## 2020-10-15 DIAGNOSIS — N184 Chronic kidney disease, stage 4 (severe): Secondary | ICD-10-CM | POA: Diagnosis not present

## 2020-10-15 DIAGNOSIS — H25013 Cortical age-related cataract, bilateral: Secondary | ICD-10-CM | POA: Diagnosis not present

## 2020-10-15 DIAGNOSIS — Z712 Person consulting for explanation of examination or test findings: Secondary | ICD-10-CM | POA: Diagnosis not present

## 2020-10-15 DIAGNOSIS — D649 Anemia, unspecified: Secondary | ICD-10-CM | POA: Diagnosis not present

## 2020-10-15 DIAGNOSIS — I10 Essential (primary) hypertension: Secondary | ICD-10-CM | POA: Diagnosis not present

## 2020-10-15 DIAGNOSIS — N401 Enlarged prostate with lower urinary tract symptoms: Secondary | ICD-10-CM | POA: Diagnosis not present

## 2020-10-15 DIAGNOSIS — Z0189 Encounter for other specified special examinations: Secondary | ICD-10-CM | POA: Diagnosis not present

## 2020-10-15 DIAGNOSIS — E1122 Type 2 diabetes mellitus with diabetic chronic kidney disease: Secondary | ICD-10-CM | POA: Diagnosis not present

## 2020-10-18 DIAGNOSIS — D649 Anemia, unspecified: Secondary | ICD-10-CM | POA: Diagnosis not present

## 2020-10-18 DIAGNOSIS — E1122 Type 2 diabetes mellitus with diabetic chronic kidney disease: Secondary | ICD-10-CM | POA: Diagnosis not present

## 2020-10-18 DIAGNOSIS — N184 Chronic kidney disease, stage 4 (severe): Secondary | ICD-10-CM | POA: Diagnosis not present

## 2020-10-18 DIAGNOSIS — I10 Essential (primary) hypertension: Secondary | ICD-10-CM | POA: Diagnosis not present

## 2020-10-29 DIAGNOSIS — R634 Abnormal weight loss: Secondary | ICD-10-CM | POA: Diagnosis not present

## 2020-10-29 DIAGNOSIS — R102 Pelvic and perineal pain: Secondary | ICD-10-CM | POA: Diagnosis not present

## 2020-10-29 DIAGNOSIS — R112 Nausea with vomiting, unspecified: Secondary | ICD-10-CM | POA: Diagnosis not present

## 2020-11-02 ENCOUNTER — Telehealth: Payer: Self-pay | Admitting: Internal Medicine

## 2020-11-02 DIAGNOSIS — R109 Unspecified abdominal pain: Secondary | ICD-10-CM

## 2020-11-02 DIAGNOSIS — R112 Nausea with vomiting, unspecified: Secondary | ICD-10-CM

## 2020-11-02 NOTE — Telephone Encounter (Signed)
Spoke with pts spouse. Pt is going to have labs completed at [facility] tomorrow and is aware that someone will call pt to schedule CT scan.

## 2020-11-02 NOTE — Telephone Encounter (Signed)
Please tell the patient that in general we don't usually send in pain medication without knowing what we're treating because, depending on what the underlying cause is, the pain medication can make that worse.  I'm putting in for some STAT labs and an ASAP CT. Have the labs done as soon as possible. Someone will help schedule the CT exam.  If the pain becomes severe, or if he has N/V to where he cannot keep down food/fluids, proceed to the ER.  Cc: Tretha Sciara for CT PA and scheduling, etc.

## 2020-11-02 NOTE — Telephone Encounter (Signed)
Please call patient , he is asking for pain medication to be sent in

## 2020-11-02 NOTE — Telephone Encounter (Signed)
Spoke with pts spouse. Pt has been having lower abdominal pian, n/v since 10/30/20. Pt is taking Omeprazole 40 mg bid as directed. Pt has an appointment at the end of February. Per pts spouse. Pt saw his PCP and was given something to take for n/v. When asked was pts bowels moving daily without concern, pts spouse said pts bowels are moving normal. Per spouse pt is avoiding foods that cause reflux and pt watches what he eats. Pt is asking for medication to help with abdominal pain.

## 2020-11-02 NOTE — Addendum Note (Signed)
Addended by: Gordy Levan, Skilynn Durney A on: 11/02/2020 04:46 PM   Modules accepted: Orders

## 2020-11-03 ENCOUNTER — Other Ambulatory Visit: Payer: Self-pay

## 2020-11-03 ENCOUNTER — Other Ambulatory Visit: Payer: Self-pay | Admitting: Nurse Practitioner

## 2020-11-03 ENCOUNTER — Ambulatory Visit (HOSPITAL_COMMUNITY)
Admission: RE | Admit: 2020-11-03 | Discharge: 2020-11-03 | Disposition: A | Discharge status: Home / Self Care | Payer: Medicare HMO | Source: Ambulatory Visit | Attending: Nurse Practitioner | Admitting: Nurse Practitioner

## 2020-11-03 DIAGNOSIS — R109 Unspecified abdominal pain: Secondary | ICD-10-CM | POA: Diagnosis not present

## 2020-11-03 DIAGNOSIS — R112 Nausea with vomiting, unspecified: Secondary | ICD-10-CM | POA: Insufficient documentation

## 2020-11-03 DIAGNOSIS — R1111 Vomiting without nausea: Secondary | ICD-10-CM | POA: Diagnosis not present

## 2020-11-03 DIAGNOSIS — K429 Umbilical hernia without obstruction or gangrene: Secondary | ICD-10-CM | POA: Diagnosis not present

## 2020-11-03 DIAGNOSIS — I714 Abdominal aortic aneurysm, without rupture: Secondary | ICD-10-CM | POA: Diagnosis not present

## 2020-11-03 DIAGNOSIS — K5730 Diverticulosis of large intestine without perforation or abscess without bleeding: Secondary | ICD-10-CM | POA: Diagnosis not present

## 2020-11-03 LAB — POCT I-STAT CREATININE: Creatinine, Ser: 2.9 mg/dL — ABNORMAL HIGH (ref 0.61–1.24)

## 2020-11-03 MED ORDER — IOHEXOL 9 MG/ML PO SOLN
ORAL | Status: AC
  Filled 2020-11-03: qty 1000

## 2020-11-03 MED ORDER — IOHEXOL 300 MG/ML  SOLN
100.0000 mL | Freq: Once | INTRAMUSCULAR | Status: AC | PRN
  Administered 2020-11-03: 100 mL via INTRAVENOUS

## 2020-11-03 NOTE — Telephone Encounter (Signed)
Called central scheduling, pt can go now to start drinking contrast for CT.  Called and informed pt's wife he can go now for CT and to be NPO. She will let him know.

## 2020-11-03 NOTE — Telephone Encounter (Signed)
CT approved. [employer]# [id number], valid 11/03/20-12/03/20.

## 2020-11-03 NOTE — Telephone Encounter (Signed)
PA started via [employer]. Case went to clinical review. Clinical notes uploaded. Procedure tracking# [id number].

## 2020-11-03 NOTE — Telephone Encounter (Signed)
Walden Field NP informed pt will be having CT.

## 2020-11-16 ENCOUNTER — Telehealth: Payer: Self-pay | Admitting: Internal Medicine

## 2020-11-16 NOTE — Telephone Encounter (Signed)
Pt returning call. [phone]

## 2020-11-16 NOTE — Telephone Encounter (Signed)
See result note.  

## 2020-11-25 ENCOUNTER — Encounter: Payer: Self-pay | Admitting: Nurse Practitioner

## 2020-11-25 ENCOUNTER — Ambulatory Visit (INDEPENDENT_AMBULATORY_CARE_PROVIDER_SITE_OTHER): Payer: Medicare HMO | Admitting: Nurse Practitioner

## 2020-11-25 ENCOUNTER — Other Ambulatory Visit: Payer: Self-pay

## 2020-11-25 VITALS — BP 135/84 | HR 80 | Temp 97.1°F | Ht 69.0 in | Wt 180.8 lb

## 2020-11-25 DIAGNOSIS — K219 Gastro-esophageal reflux disease without esophagitis: Secondary | ICD-10-CM | POA: Diagnosis not present

## 2020-11-25 DIAGNOSIS — R1010 Upper abdominal pain, unspecified: Secondary | ICD-10-CM

## 2020-11-25 DIAGNOSIS — R197 Diarrhea, unspecified: Secondary | ICD-10-CM | POA: Diagnosis not present

## 2020-11-25 NOTE — Patient Instructions (Addendum)
Your health issues we discussed today were:   GERD (reflux/heartburn): 1. Continue taking omeprazole (Prilosec) once a day 2. If you have significant flare or worsening symptoms, you can increase this to twice a day for several days to help 3. You can also use Tums to help with breakthrough symptoms 4. When your flare symptoms resolve you can return back to once a day dosing 5. Call for any worsening or severe symptoms that do not go away with increased medication  Abdominal pain and diarrhea: 1. I am glad you are doing better with this! 2. Again, your CT scan to be completed found no significant problems 3. I believe your abdominal pain is likely contributed to by your reflux 4. Call us for any worsening or returning symptoms  Overall I recommend:  1. Continue other current medications 2. Return for follow-up in 6 months 3. Call us for any questions or concerns   ---------------------------------------------------------------  I am glad you have gotten your COVID-19 vaccination!  Even though you are fully vaccinated you should continue to follow CDC and state/local guidelines.  ---------------------------------------------------------------   At [facility] we value your feedback. You may receive a survey about your visit today. Please share your experience as we strive to create trusting relationships with our patients to provide genuine, compassionate, quality care.  We appreciate your understanding and patience as we review any laboratory studies, imaging, and other diagnostic tests that are ordered as we care for you. Our office policy is 5 business days for review of these results, and any emergent or urgent results are addressed in a timely manner for your best interest. If you do not hear from our office in 1 week, please contact us.   We also encourage the use of MyChart, which contains your medical information for your review as well. If you are not enrolled  in this feature, an access code is on this after visit summary for your convenience. Thank you for allowing [place] to be involved in your care.  It was great to see you today!  I hope you have a great end of winter and early spring!!

## 2020-11-25 NOTE — Progress Notes (Signed)
Referring Provider: Celene Squibb, MD Primary Care Physician:  Celene Squibb, MD Primary GI:  Dr. Gala Romney  Chief Complaint  Patient presents with  . Gastroesophageal Reflux    Doing ok  . Diarrhea    Doing ok    HPI:   Dylan Cline is a 70 y.o. male who presents for follow-up on diarrhea and GERD.  Patient was last seen in our office 07/01/2020 for weight loss, diarrhea, GERD, poor appetite.  At that time noted history of alcohol induced pancreatitis, weight loss, abdominal pain, GERD.  EGD 2006 and CT of the abdomen for possible pancreatic mass with findings including normal esophagus, submucosal petechia hemorrhages as well as consequence of vomiting trauma.  Recommend CA 19-9 and if elevated proceed to FNA CT-guided biopsy.  Also remote history of peptic ulcer disease.  Eventual diagnosis with pancreatic pseudocyst and underwent FNA at [facility] and repeat CT which showed decrease size of the pseudocyst, no new findings or discrete solid pancreatic masses.  Patient is undergone a left lateral parotidectomy with facial nerve dissection for left parotid mass and surgical pathology found excised tumor to be a Warthin's tumor measuring 4.9 cm.  His last visit noted diarrhea 3 weeks prior with every stool, watery.  Improved minimally and now 1/2-3/4 of stools are watery.  Generally having only about 1 stool a day, previously 3 to 4-day.  No healthcare exposure, well water, travel, antibiotics.  Poor appetite with 30 pound subjective weight loss in 6 weeks but has been eating better now.  GERD doing well on Prilosec.  Objectively his weight was up 2 pounds at his last visit.  Has never had a colonoscopy.  Recommended eating healthy nutritious diet, supplements such as Ensure, boost, or the like.  Continue PPI, request previous blood work, schedule colonoscopy and EGD, probiotic for the next 2 to 3 months, follow-up in 2 months.  C. difficile and GI pathogen panel was completed on 07/01/2020  were both negative.  We attempted to schedule a colonoscopy and EGD, left a voicemail and mail the letter.  Never received response.  Patient called our office 11/02/2018 requesting pain medication for lower abdominal pain with nausea and vomiting.  Noted normal bowel movements, avoiding triggers.  Recommend stat labs and ASAP CT scan.   CT completed 11/03/2020 which found no acute intra-abdominal or pelvic pathology, moderate colonic stool burden without obstruction, fat-containing supraumbilical hernias as seen on prior CT no herniation or fluid collection, noted three-point centimeter infrarenal abdominal aortic aneurysm recommended ultrasound every 3 years.  Only call to relay the information to the patient and offer constipation management options she stated the patient is not having any further pain I will discuss at his next appointment.  Requested stat labs were not completed.  Today he states doing okay overall. Had some GERD flare a couple weeks ago, bought OTC Nexium which helped. Currently GERD is doing well. He has only been taking Prilosec once a day, we had it at bid. Discussed increasing back to bid. GERD flares aren't very frequent. Symptoms are less than once a month. Diarrhea is doing well currently, has a bowel movement about once every other day, consistent with [facility] 4. Denies N/V, hematochezia, melena, fever, chills, unintentional weight loss. Denies URI or flu-like symptoms. Denies loss of sense of taste or smell. The patient has received COVID-19 vaccination(s). Denies chest pain, dyspnea, dizziness, lightheadedness, syncope, near syncope. Denies any other upper or lower GI symptoms.  Past Medical  History:  Diagnosis Date  . Acute type 2 diabetes mellitus with manifestations (HCC)    no meds currently  . CRI (chronic renal insufficiency), stage 3 (moderate) (HCC)   . GERD (gastroesophageal reflux disease)   . Hypertension   . Mass of left parotid gland   . Pancreatitis   .  Pancreatitis, alcoholic, acute 67/03/7208  . Smoker     Past Surgical History:  Procedure Laterality Date  . CATARACT EXTRACTION W/PHACO Right 11/24/2019   Procedure: CATARACT EXTRACTION PHACO AND INTRAOCULAR LENS PLACEMENT (IOC);  Surgeon: [name], MD;  Location: [facility];  Service: Ophthalmology;  Laterality: Right;  CDE: 4.96  . CATARACT EXTRACTION W/PHACO Left 07/26/2020   Procedure: CATARACT EXTRACTION PHACO AND INTRAOCULAR LENS PLACEMENT (IOC);  Surgeon: [name], MD;  Location: [facility];  Service: Ophthalmology;  Laterality: Left;  CDE: 6.94  . COLONOSCOPY   01/27/2005   OBS:[id number] hemorrhoids, otherwise normal rectum/Normal colon, marginal prep made the exam more difficult  . ESOPHAGOGASTRODUODENOSCOPY   10/12/2004   HUT:MLYYTK esophagus/Focal area of submucosal petechial hemorrhage in the fundus (likely a trauma-induced phenomenon secondary to vomiting), otherwise normal  . LAPAROSCOPIC GASTROTOMY W/ REPAIR OF ULCER    . PAROTIDECTOMY Left 07/29/2019   Procedure: LEFT PAROTIDECTOMY;  Surgeon: Leta Baptist, MD;  Location: [place];  Service: ENT;  Laterality: Left;    Current Outpatient Medications  Medication Sig Dispense Refill  . amLODipine (NORVASC) 2.5 MG tablet Take 1 tablet (2.5 mg total) by mouth daily. 30 tablet 1  . atorvastatin (LIPITOR) 40 MG tablet Take 40 mg by mouth daily.    Marland Kitchen JANUVIA 50 MG tablet Take 50 mg by mouth daily.    Marland Kitchen LEVEMIR FLEXTOUCH 100 UNIT/ML Pen Inject 25 Units into the skin at bedtime.     Marland Kitchen omeprazole (PRILOSEC) 40 MG capsule Take 1 capsule (40 mg total) by mouth 2 (two) times daily. (Patient taking differently: Take 40 mg by mouth daily.) 180 capsule 3  . tamsulosin (FLOMAX) 0.4 MG CAPS capsule Take 0.4 mg by mouth at bedtime.     No current facility-administered medications for this visit.    Allergies as of 11/25/2020  . (No Known Allergies)    Family History  Problem Relation Age of Onset  . Diabetes Mother   .  Diabetes Father   . Heart attack Maternal Grandfather   . Cancer Paternal Grandmother   . Heart attack Maternal Uncle   . Cancer Maternal Aunt   . Cancer Maternal Uncle   . Colon cancer Neg Hx   . Pancreatic cancer Neg Hx   . Stomach cancer Neg Hx     Social History   Socioeconomic History  . Marital status: Married    Spouse name: Not on file  . Number of children: Not on file  . Years of education: Not on file  . Highest education level: Not on file  Occupational History  . Not on file  Tobacco Use  . Smoking status: Current Every Day Smoker    Packs/day: 1.00    Years: 40.00    Pack years: 40.00    Types: Cigarettes    Start date: 12/01/1970  . Smokeless tobacco: Never Used  Vaping Use  . Vaping Use: Never used  Substance and Sexual Activity  . Alcohol use: Yes    Alcohol/week: 0.0 standard drinks    Comment: 11/25/20: drinks a bottle of wine a day  . Drug use: Yes    Types: Marijuana  Comment: occasionally to stimulate appetite  . Sexual activity: Not on file  Other Topics Concern  . Not on file  Social History Narrative  . Not on file   Social Determinants of Health   Financial Resource Strain: Not on file  Food Insecurity: Not on file  Transportation Needs: Not on file  Physical Activity: Not on file  Stress: Not on file  Social Connections: Not on file    Subjective: Review of Systems  Constitutional: Negative for chills, fever, malaise/fatigue and weight loss.  HENT: Negative for congestion and sore throat.   Respiratory: Negative for cough and shortness of breath.   Cardiovascular: Negative for chest pain and palpitations.  Gastrointestinal: Negative for abdominal pain, blood in stool, diarrhea, melena, nausea and vomiting.  Musculoskeletal: Negative for joint pain and myalgias.  Skin: Negative for rash.  Neurological: Negative for dizziness and weakness.  Endo/Heme/Allergies: Does not bruise/bleed easily.  Psychiatric/Behavioral: Negative for  depression. The patient is not nervous/anxious.   All other systems reviewed and are negative.    Objective: BP 135/84   Pulse 80   Temp (!) 97.1 F (36.2 C) (Temporal)   Ht 5\' 9"  (1.753 m)   Wt 180 lb 12.8 oz (82 kg)   BMI 26.70 kg/m  Physical Exam Vitals and nursing note reviewed.  Constitutional:      General: He is not in acute distress.    Appearance: Normal appearance. He is normal weight. He is not ill-appearing, toxic-appearing or diaphoretic.  HENT:     Head: Normocephalic and atraumatic.     Nose: No congestion or rhinorrhea.  Eyes:     General: No scleral icterus. Cardiovascular:     Rate and Rhythm: Normal rate and regular rhythm.     Heart sounds: Normal heart sounds.  Pulmonary:     Effort: Pulmonary effort is normal.     Breath sounds: Normal breath sounds.  Abdominal:     General: Bowel sounds are normal. There is no distension.     Palpations: Abdomen is soft. There is no hepatomegaly, splenomegaly or mass.     Tenderness: There is no abdominal tenderness. There is no guarding or rebound.     Hernia: No hernia is present.  Musculoskeletal:     Cervical back: Neck supple.  Skin:    General: Skin is warm and dry.     Coloration: Skin is not jaundiced.     Findings: No bruising or rash.  Neurological:     General: No focal deficit present.     Mental Status: He is alert and oriented to person, place, and time. Mental status is at baseline.  Psychiatric:        Mood and Affect: Mood normal.        Behavior: Behavior normal.        Thought Content: Thought content normal.      Assessment:  Very pleasant 81-year-old male presents to follow-up on GERD, diarrhea, abdominal pain.  Previously diagnosed with pancreatic pseudocyst status post FNA at [facility] with no new findings or discrete solid pancreatic masses.  Status post left lateral parotidectomy with facial nerve dissection for Warthin's tumor.  Previously with significant watery stools  negative for C. difficile and negative GI pathogen panel.  Unable to complete EGD due to difficulty contacting the patient.  Previous weight loss with recommended protein supplements that seems to have stabilized.  GERD: Overall adequately managed, had a flare a couple weeks ago with over-the-counter Nexium helping in  addition to his daily Prilosec.  Previously we had recommended increasing to twice a day, but he is only currently taking it once a day.  GERD flares are pretty infrequent less than once a month.  At this point I feel that we can have him continue once a day dosing of Prilosec with an increased dose to twice a day as needed versus Tums for breakthrough.  Diarrhea: Overall doing well, bowel movement every other day consistently [place] 4.  Diarrhea essentially resolved.  Recommend he monitor notify us of any worsening or recurrence.   Plan: 1. Continue Prilosec 40 mg daily 2. Can take a second dose of Prilosec as needed for breakthrough 3. Could also use Tums, Rolaids, or the like as needed for breakthrough 4. Continue other medications 5. Follow-up in 6 months 6. Call us for worsening or severe symptoms.    Thank you for allowing [place] to participate in the care of Dylan Net, DNP, AGNP-C Adult & Gerontological Nurse Practitioner [facility]   11/25/2020 3:00 PM   Disclaimer: This note was dictated with voice recognition software. Similar sounding words can inadvertently be transcribed and may not be corrected upon review.

## 2020-12-13 DIAGNOSIS — E1122 Type 2 diabetes mellitus with diabetic chronic kidney disease: Secondary | ICD-10-CM | POA: Diagnosis not present

## 2020-12-13 DIAGNOSIS — N184 Chronic kidney disease, stage 4 (severe): Secondary | ICD-10-CM | POA: Diagnosis not present

## 2020-12-13 DIAGNOSIS — R399 Unspecified symptoms and signs involving the genitourinary system: Secondary | ICD-10-CM | POA: Diagnosis not present

## 2020-12-13 DIAGNOSIS — I129 Hypertensive chronic kidney disease with stage 1 through stage 4 chronic kidney disease, or unspecified chronic kidney disease: Secondary | ICD-10-CM | POA: Diagnosis not present

## 2020-12-13 DIAGNOSIS — Z720 Tobacco use: Secondary | ICD-10-CM | POA: Diagnosis not present

## 2021-01-17 ENCOUNTER — Encounter: Payer: Self-pay | Admitting: Gastroenterology

## 2021-02-08 ENCOUNTER — Other Ambulatory Visit: Payer: Self-pay | Admitting: *Deleted

## 2021-02-08 DIAGNOSIS — Z87891 Personal history of nicotine dependence: Secondary | ICD-10-CM

## 2021-02-08 DIAGNOSIS — F17210 Nicotine dependence, cigarettes, uncomplicated: Secondary | ICD-10-CM

## 2021-02-10 DIAGNOSIS — R21 Rash and other nonspecific skin eruption: Secondary | ICD-10-CM | POA: Diagnosis not present

## 2021-02-10 DIAGNOSIS — R110 Nausea: Secondary | ICD-10-CM | POA: Diagnosis not present

## 2021-02-10 DIAGNOSIS — K529 Noninfective gastroenteritis and colitis, unspecified: Secondary | ICD-10-CM | POA: Diagnosis not present

## 2021-03-03 ENCOUNTER — Ambulatory Visit: Payer: Medicare HMO | Admitting: Pulmonary Disease

## 2021-03-09 ENCOUNTER — Ambulatory Visit (HOSPITAL_COMMUNITY)
Admission: RE | Admit: 2021-03-09 | Discharge: 2021-03-09 | Disposition: A | Discharge status: Home / Self Care | Payer: Medicare HMO | Source: Ambulatory Visit | Attending: Acute Care | Admitting: Acute Care

## 2021-03-09 DIAGNOSIS — F17210 Nicotine dependence, cigarettes, uncomplicated: Secondary | ICD-10-CM | POA: Diagnosis not present

## 2021-03-09 DIAGNOSIS — Z87891 Personal history of nicotine dependence: Secondary | ICD-10-CM | POA: Diagnosis not present

## 2021-03-17 NOTE — Progress Notes (Signed)

## 2021-03-21 ENCOUNTER — Other Ambulatory Visit: Payer: Self-pay | Admitting: *Deleted

## 2021-03-21 DIAGNOSIS — Z87891 Personal history of nicotine dependence: Secondary | ICD-10-CM

## 2021-03-21 DIAGNOSIS — F17210 Nicotine dependence, cigarettes, uncomplicated: Secondary | ICD-10-CM

## 2021-03-29 ENCOUNTER — Ambulatory Visit (INDEPENDENT_AMBULATORY_CARE_PROVIDER_SITE_OTHER): Payer: Medicare HMO | Admitting: Pulmonary Disease

## 2021-03-29 VITALS — BP 138/76 | HR 75 | Ht 69.0 in | Wt 170.4 lb

## 2021-03-29 DIAGNOSIS — J432 Centrilobular emphysema: Secondary | ICD-10-CM | POA: Diagnosis not present

## 2021-03-29 DIAGNOSIS — Z720 Tobacco use: Secondary | ICD-10-CM

## 2021-03-29 DIAGNOSIS — R918 Other nonspecific abnormal finding of lung field: Secondary | ICD-10-CM | POA: Diagnosis not present

## 2021-03-29 NOTE — Progress Notes (Signed)
Synopsis: Referred in 10/2018 for pulmonary lung nodules   Subjective:   PATIENT ID: Dylan Cline GENDER: male DOB: [birth year], MRN: [id number]   HPI  Chief Complaint  Patient presents with   Lung Mass    Follow up doing fine.   [name] is a 70 year old male current smoker with emphysema, HTN, DM2, hx alcoholism and pancreatitis who presents or follow-up of pulmonary nodules.  At baseline he is an active smoker, smoking 1 ppd since he was a teenager. Has shortness of breath with moderate exertion but denies limitation in activity. On initial consult in 10/2018 he had a CT Chest completed on 06/02/18 that demonstrated multiple pulmonary nodules with the largest measuring 8mm. He does not recall undergoing the CT and discussing the results with anyone. Recent lung screen with unchanged including RUL nodule ~20mm since 11/2019 (16 months ago). He reports long standing shortness of breath even before smoking. He started smoking at 70 years old. Denies cough or wheezing. He does not routinely exercise. He is limited by hip pain.   Social History: Active smoker.  40-pack-year history. 1-1.5 ppd daily  Environmental exposures: Denies any known exposure.   I have personally reviewed patient's past medical/family/social history/allergies/current medications.  Past Medical History:  Diagnosis Date   Acute type 2 diabetes mellitus with manifestations (HCC)    no meds currently   CRI (chronic renal insufficiency), stage 3 (moderate) (HCC)    GERD (gastroesophageal reflux disease)    Hypertension    Mass of left parotid gland    Pancreatitis    Pancreatitis, alcoholic, acute 08/10/2014   Smoker      Family History  Problem Relation Age of Onset   Diabetes Mother    Diabetes Father    Heart attack Maternal Grandfather    Cancer Paternal Grandmother    Heart attack Maternal Uncle    Cancer Maternal Aunt    Cancer Maternal Uncle    Colon cancer Neg Hx    Pancreatic cancer Neg Hx     Stomach cancer Neg Hx      Social History   Occupational History   Not on file  Tobacco Use   Smoking status: Every Day    Packs/day: 1.00    Years: 40.00    Pack years: 40.00    Types: Cigarettes    Start date: 12/01/1970   Smokeless tobacco: Never  Vaping Use   Vaping Use: Never used  Substance and Sexual Activity   Alcohol use: Yes    Alcohol/week: 0.0 standard drinks    Comment: 11/25/20: drinks a bottle of wine a day   Drug use: Yes    Types: Marijuana    Comment: occasionally to stimulate appetite   Sexual activity: Not on file    No Known Allergies   Outpatient Medications Prior to Visit  Medication Sig Dispense Refill   amLODipine (NORVASC) 2.5 MG tablet Take 1 tablet (2.5 mg total) by mouth daily. 30 tablet 1   atorvastatin (LIPITOR) 40 MG tablet Take 40 mg by mouth daily.     JANUVIA 50 MG tablet Take 50 mg by mouth daily.     LEVEMIR FLEXTOUCH 100 UNIT/ML Pen Inject 25 Units into the skin at bedtime.      omeprazole (PRILOSEC) 40 MG capsule Take 1 capsule (40 mg total) by mouth 2 (two) times daily. (Patient taking differently: Take 40 mg by mouth daily.) 180 capsule 3   tamsulosin (FLOMAX) 0.4 MG CAPS capsule Take  0.4 mg by mouth at bedtime.     No facility-administered medications prior to visit.    Review of Systems  Constitutional:  Negative for chills, diaphoresis, fever, malaise/fatigue and weight loss.  HENT:  Negative for congestion.   Respiratory:  Positive for shortness of breath. Negative for cough, hemoptysis, sputum production and wheezing.   Cardiovascular:  Negative for chest pain, palpitations and leg swelling.   Objective:   Vitals:   03/29/21 1422  BP: 138/76  Pulse: 75  SpO2: 99%  Weight: 170 lb 6.4 oz (77.3 kg)  Height: 5\' 9"  (1.753 m)   SpO2: 99 %  Physical Exam: General: Well-appearing, no acute distress HENT: [place], AT Eyes: EOMI, no scleral icterus Respiratory: Clear to auscultation bilaterally.  No crackles, wheezing or  rales Cardiovascular: RRR, -M/R/G, no JVD Extremities:-Edema,-tenderness Neuro: AAO x4, CNII-XII grossly intact Skin: Intact, no rashes or bruising Psych: Normal mood, normal affect  Chest imaging: CT Chest 06/12/2018 [facility] (report only, no imaging available) FINDINGS:   - Lower Neck: Unremarkable.    - Heart and Vessels: Normal heart size.  No pericardial effusion. Normal caliber thoracic aorta and main pulmonary artery. Mild coronary artery calcification.  - Mediastinum, Hila and Axilla: Mildly enlarged AP window lymph node measuring 1.4 x 1.2 cm (image 37).   - Lungs: Central airways are patent. Mild predominant paraseptal and centrilobular emphysema. There is an 8 mm right upper lobe nodule adjacent to the fissure (series 102, image 148), 6 mm anterior right upper lobe nodule (image 148), 6 mm left lower  lobe nodule (image 185), and 6 mm left lower lobe nodule (image 267).   - Pleura: No pleural effusion or pneumothorax.   - Upper Abdomen: Diverticulosis.   - Musculoskeletal: No acute or aggressive osseous abnormalities. Multilevel degenerative disc disease.  - Miscellaneous: N/A. Impression: 1.  Mild emphysema. 2.  Multiple pulmonary nodules, the largest measuring 8 mm. If the patient is low risk for lung cancer, recommend follow-up CT at 3-6 months and optional follow-up CT at 18-24 months.  If the patient is high risk for lung cancer, recommend follow-up CT  at 3-6 months and again at 18-24 months to ensure stability. (2017 Fleischner Guidelines).  3.  Nonspecific mildly enlarged AP window lymph node. This could potentially be reactive.  CT abdomen pelvis 05/24/2013-lower lobes with minimal atelectasis in the bases CXR 10/28/2011-normal chest x-ray.  No infiltrates or effusion.  CT Chest Lung Screen 03/09/21 - Stable lung nodules with largest 7.2 mm. Paraseptal and centrilobular emphysema.  PFT: None on file  Imaging, labs and test noted above have been reviewed  independently by me.    Assessment & Plan:  70 year old male current smoker with emphysema, HTN, DM2, hx alcoholism and pancreatitis who presents or follow-up of pulmonary nodules. Reports baseline shortness of breath with minimal activity. Discussed benefits of bronchodilators and pulmonary rehab. We also reviewed CT scan in clinic.  Emphysema/COPD GOLD Class B - uncontrolled symptoms --START Spiriva 2.5 mcg TWO puffs ONCE a day. Sample and prescription ordered --Refer to pulmonary rehab  Tobacco abuse Patient is an active smoker. We discussed smoking cessation for 5 minutes. We discussed triggers and stressors and ways to deal with them. We discussed barriers to continued smoking and benefits of smoking cessation.   Multiple lung nodules - stable Continue annual CT lung screen  Orders Placed This Encounter  Procedures   AMB referral to pulmonary rehabilitation    Referral Priority:   Routine  Referral Type:   Consultation    Number of Visits Requested:   1  No orders of the defined types were placed in this encounter.   Return in about 3 months (around 06/29/2021).   I have spent a total time of 35-minutes on the day of the appointment reviewing prior documentation, coordinating care and discussing medical diagnosis and plan with the patient/family. Imaging, labs and tests included in this note have been reviewed and interpreted independently by me.  Akbar Sacra Mechele Collin, MD [facility] 03/29/2021 2:46 PM

## 2021-03-29 NOTE — Patient Instructions (Signed)
Emphysema --START Spiriva 2.5 mcg TWO puffs ONCE a day. Sample and prescription ordered  Follow-up with me in 3 months

## 2021-04-06 ENCOUNTER — Encounter: Payer: Self-pay | Admitting: Pulmonary Disease

## 2021-04-06 DIAGNOSIS — J432 Centrilobular emphysema: Secondary | ICD-10-CM | POA: Insufficient documentation

## 2021-05-16 DIAGNOSIS — I10 Essential (primary) hypertension: Secondary | ICD-10-CM | POA: Diagnosis not present

## 2021-05-16 DIAGNOSIS — E119 Type 2 diabetes mellitus without complications: Secondary | ICD-10-CM | POA: Diagnosis not present

## 2021-05-18 DIAGNOSIS — E1122 Type 2 diabetes mellitus with diabetic chronic kidney disease: Secondary | ICD-10-CM | POA: Diagnosis not present

## 2021-05-18 DIAGNOSIS — Z0001 Encounter for general adult medical examination with abnormal findings: Secondary | ICD-10-CM | POA: Diagnosis not present

## 2021-05-18 DIAGNOSIS — R634 Abnormal weight loss: Secondary | ICD-10-CM | POA: Diagnosis not present

## 2021-05-18 DIAGNOSIS — N401 Enlarged prostate with lower urinary tract symptoms: Secondary | ICD-10-CM | POA: Diagnosis not present

## 2021-05-18 DIAGNOSIS — H269 Unspecified cataract: Secondary | ICD-10-CM | POA: Diagnosis not present

## 2021-05-18 DIAGNOSIS — R2231 Localized swelling, mass and lump, right upper limb: Secondary | ICD-10-CM | POA: Diagnosis not present

## 2021-05-18 DIAGNOSIS — I10 Essential (primary) hypertension: Secondary | ICD-10-CM | POA: Diagnosis not present

## 2021-05-18 DIAGNOSIS — D631 Anemia in chronic kidney disease: Secondary | ICD-10-CM | POA: Diagnosis not present

## 2021-05-18 DIAGNOSIS — N184 Chronic kidney disease, stage 4 (severe): Secondary | ICD-10-CM | POA: Diagnosis not present

## 2021-05-23 NOTE — Progress Notes (Deleted)
Referring Provider: Celene Squibb, MD Primary Care Physician:  Celene Squibb, MD Primary GI Physician: Dr. Rayne Du chief complaint on file.   HPI:   Dylan Cline is a 70 y.o. male with a history of GERD, remote history of PUD, diarrhea (C. difficile and GI pathogen panel - September 2021), abdominal pain, alcohol induced pancreatitis, pancreatic pseudocyst (per prior OV notes, FNA [facility] previously), who is presenting today for follow-up.   Last CT A/P without contrast February 2022 for abdominal pain and nausea with several punctate pancreatic calcifications consistent with chronic pancreatitis, moderate stool throughout the colon, diverticula without inflammatory changes.   Patient last seen in our office 11/25/2020.  Reported a GERD flare couple weeks ago, bought OTC Nexium which helped.  Currently, GERD doing well.  Noted he was only taking Prilosec once a day, but we had prescribed twice daily.  Discussed increasing back to twice daily, but patient stated GERD flares were less than once a month.  His diarrhea was doing well with BMs once every other day, [place] 4.  Denied any other significant GI symptoms.  Weight loss had stabilized.  Previously have recommended EGD and colonoscopy, but office has been unable to reach the patient.  Procedures were not arranged as patient was doing well.  Advised to continue Prilosec 40 mg daily, increase to twice daily if flare, follow-up in 6 months.  Today:  GERD:  Colon cancer screening:   Past Medical History:  Diagnosis Date   Acute type 2 diabetes mellitus with manifestations ([place])    no meds currently   CRI (chronic renal insufficiency), stage 3 (moderate) (HCC)    GERD (gastroesophageal reflux disease)    Hypertension    Mass of left parotid gland    Pancreatitis    Pancreatitis, alcoholic, acute 35/12/2990   Pancreatitis, alcoholic, acute 42/03/8340   Smoker     Past Surgical History:  Procedure Laterality Date    CATARACT EXTRACTION W/PHACO Right 11/24/2019   Procedure: CATARACT EXTRACTION PHACO AND INTRAOCULAR LENS PLACEMENT ([place]);  Surgeon: [name], MD;  Location: [facility];  Service: Ophthalmology;  Laterality: Right;  CDE: 4.96   CATARACT EXTRACTION W/PHACO Left 07/26/2020   Procedure: CATARACT EXTRACTION PHACO AND INTRAOCULAR LENS PLACEMENT ([facility]);  Surgeon: [name], MD;  Location: [facility];  Service: Ophthalmology;  Laterality: Left;  CDE: 6.94   COLONOSCOPY   01/27/2005   DQQ:IWLNLGXQ hemorrhoids, otherwise normal rectum/Normal colon, marginal prep made the exam more difficult   ESOPHAGOGASTRODUODENOSCOPY   10/12/2004   JJH:ERDEYC esophagus/Focal area of submucosal petechial hemorrhage in the fundus (likely a trauma-induced phenomenon secondary to vomiting), otherwise normal   LAPAROSCOPIC GASTROTOMY W/ REPAIR OF ULCER     PAROTIDECTOMY Left 07/29/2019   Procedure: LEFT PAROTIDECTOMY;  Surgeon: Leta Baptist, MD;  Location: [facility];  Service: ENT;  Laterality: Left;    Current Outpatient Medications  Medication Sig Dispense Refill   amLODipine (NORVASC) 2.5 MG tablet Take 1 tablet (2.5 mg total) by mouth daily. 30 tablet 1   atorvastatin (LIPITOR) 40 MG tablet Take 40 mg by mouth daily.     JANUVIA 50 MG tablet Take 50 mg by mouth daily.     LEVEMIR FLEXTOUCH 100 UNIT/ML Pen Inject 25 Units into the skin at bedtime.      omeprazole (PRILOSEC) 40 MG capsule Take 1 capsule (40 mg total) by mouth 2 (two) times daily. (Patient taking differently: Take 40 mg by mouth daily.) 180  capsule 3   tamsulosin (FLOMAX) 0.4 MG CAPS capsule Take 0.4 mg by mouth at bedtime.     No current facility-administered medications for this visit.    Allergies as of 05/25/2021   (No Known Allergies)    Family History  Problem Relation Age of Onset   Diabetes Mother    Diabetes Father    Heart attack Maternal Grandfather    Cancer Paternal Grandmother    Heart attack Maternal Uncle     Cancer Maternal Aunt    Cancer Maternal Uncle    Colon cancer Neg Hx    Pancreatic cancer Neg Hx    Stomach cancer Neg Hx     Social History   Socioeconomic History   Marital status: Married    Spouse name: Not on file   Number of children: Not on file   Years of education: Not on file   Highest education level: Not on file  Occupational History   Not on file  Tobacco Use   Smoking status: Every Day    Packs/day: 1.00    Years: 40.00    Pack years: 40.00    Types: Cigarettes    Start date: 12/01/1970   Smokeless tobacco: Never  Vaping Use   Vaping Use: Never used  Substance and Sexual Activity   Alcohol use: Yes    Alcohol/week: 0.0 standard drinks    Comment: 11/25/20: drinks a bottle of wine a day   Drug use: Yes    Types: Marijuana    Comment: occasionally to stimulate appetite   Sexual activity: Not on file  Other Topics Concern   Not on file  Social History Narrative   Not on file   Social Determinants of Health   Financial Resource Strain: Not on file  Food Insecurity: Not on file  Transportation Needs: Not on file  Physical Activity: Not on file  Stress: Not on file  Social Connections: Not on file    Review of Systems: Gen: Denies fever, chills, anorexia. Denies fatigue, weakness, weight loss.  CV: Denies chest pain, palpitations, syncope, peripheral edema, and claudication. Resp: Denies dyspnea at rest, cough, wheezing, coughing up blood, and pleurisy. GI: Denies vomiting blood, jaundice, and fecal incontinence.   Denies dysphagia or odynophagia. Derm: Denies rash, itching, dry skin Psych: Denies depression, anxiety, memory loss, confusion. No homicidal or suicidal ideation.  Heme: Denies bruising, bleeding, and enlarged lymph nodes.  Physical Exam: There were no vitals taken for this visit. General:   Alert and oriented. No distress noted. Pleasant and cooperative.  Head:  Normocephalic and atraumatic. Eyes:  Conjuctiva clear without scleral  icterus. Mouth:  Oral mucosa pink and moist. Good dentition. No lesions. Heart:  S1, S2 present without murmurs appreciated. Lungs:  Clear to auscultation bilaterally. No wheezes, rales, or rhonchi. No distress.  Abdomen:  +BS, soft, non-tender and non-distended. No rebound or guarding. No HSM or masses noted. Msk:  Symmetrical without gross deformities. Normal posture. Extremities:  Without edema. Neurologic:  Alert and  oriented x4 Psych:  Alert and cooperative. Normal mood and affect.

## 2021-05-25 ENCOUNTER — Ambulatory Visit: Payer: Medicare HMO | Admitting: Gastroenterology

## 2021-05-26 ENCOUNTER — Ambulatory Visit: Payer: Medicare HMO | Admitting: Nurse Practitioner

## 2021-06-07 ENCOUNTER — Ambulatory Visit: Payer: Medicare HMO | Admitting: Orthopaedic Surgery

## 2021-06-07 ENCOUNTER — Encounter: Payer: Self-pay | Admitting: Orthopaedic Surgery

## 2021-06-07 ENCOUNTER — Other Ambulatory Visit: Payer: Self-pay

## 2021-06-07 VITALS — BP 168/147 | HR 88 | Ht 69.0 in | Wt 167.0 lb

## 2021-06-07 DIAGNOSIS — F17210 Nicotine dependence, cigarettes, uncomplicated: Secondary | ICD-10-CM

## 2021-06-07 DIAGNOSIS — M25531 Pain in right wrist: Secondary | ICD-10-CM | POA: Diagnosis not present

## 2021-06-07 MED ORDER — HYDROCODONE-ACETAMINOPHEN 5-325 MG PO TABS
ORAL_TABLET | ORAL | 0 refills | Status: DC
Start: 2021-06-07 — End: 2021-06-20

## 2021-06-07 NOTE — Progress Notes (Signed)
Subjective:    Patient ID: Dylan Cline, male    DOB: Jun 03, 1951, 70 y.o.   MRN: [id number]  HPI He has history of carpal tunnel of the right wrist.  I saw him for this in around 2003.  I made a splint for him.  I have not seen him in many years.  He says the splint no longer helps.  He has nocturnal pain.  He has seen Dr. Wende Neighbors and I have copies of the notes.  He says he is tired of the pain and the numbness.  He wants surgery now.    He has not had any EMGs.  I will get this done.  He will most likely need the surgery.  He does have diabetes.   Review of Systems  Constitutional:  Positive for activity change.  Musculoskeletal:  Positive for arthralgias.  All other systems reviewed and are negative. For Review of Systems, all other systems reviewed and are negative.  The following is a summary of the past history medically, past history surgically, known current medicines, social history and family history.  This information is gathered electronically by the computer from prior information and documentation.  I review this each visit and have found including this information at this point in the chart is beneficial and informative.   Past Medical History:  Diagnosis Date   Acute type 2 diabetes mellitus with manifestations ([place])    no meds currently   CRI (chronic renal insufficiency), stage 3 (moderate) (HCC)    GERD (gastroesophageal reflux disease)    Hypertension    Mass of left parotid gland    Pancreatitis    Pancreatitis, alcoholic, acute 76/04/3418   Pancreatitis, alcoholic, acute 37/06/239   Smoker     Past Surgical History:  Procedure Laterality Date   CATARACT EXTRACTION W/PHACO Right 11/24/2019   Procedure: CATARACT EXTRACTION PHACO AND INTRAOCULAR LENS PLACEMENT ([place]);  Surgeon: [name], MD;  Location: [facility];  Service: Ophthalmology;  Laterality: Right;  CDE: 4.96   CATARACT EXTRACTION W/PHACO Left 07/26/2020   Procedure: CATARACT EXTRACTION PHACO AND  INTRAOCULAR LENS PLACEMENT ([facility]);  Surgeon: [name], MD;  Location: [facility];  Service: Ophthalmology;  Laterality: Left;  CDE: 6.94   COLONOSCOPY   01/27/2005   XBD:ZHGDJMEQ hemorrhoids, otherwise normal rectum/Normal colon, marginal prep made the exam more difficult   ESOPHAGOGASTRODUODENOSCOPY   10/12/2004   AST:MHDQQI esophagus/Focal area of submucosal petechial hemorrhage in the fundus (likely a trauma-induced phenomenon secondary to vomiting), otherwise normal   LAPAROSCOPIC GASTROTOMY W/ REPAIR OF ULCER     PAROTIDECTOMY Left 07/29/2019   Procedure: LEFT PAROTIDECTOMY;  Surgeon: Leta Baptist, MD;  Location: [facility];  Service: ENT;  Laterality: Left;    Current Outpatient Medications on File Prior to Visit  Medication Sig Dispense Refill   amLODipine (NORVASC) 2.5 MG tablet Take 1 tablet (2.5 mg total) by mouth daily. 30 tablet 1   atorvastatin (LIPITOR) 40 MG tablet Take 40 mg by mouth daily.     LEVEMIR FLEXTOUCH 100 UNIT/ML Pen Inject 25 Units into the skin at bedtime.      omeprazole (PRILOSEC) 40 MG capsule Take 1 capsule (40 mg total) by mouth 2 (two) times daily. (Patient taking differently: Take 40 mg by mouth daily.) 180 capsule 3   tamsulosin (FLOMAX) 0.4 MG CAPS capsule Take 0.4 mg by mouth at bedtime.     JANUVIA 50 MG tablet Take 50 mg by mouth daily. (Patient not taking: Reported  on 06/07/2021)     No current facility-administered medications on file prior to visit.    Social History   Socioeconomic History   Marital status: Married    Spouse name: Not on file   Number of children: Not on file   Years of education: Not on file   Highest education level: Not on file  Occupational History   Not on file  Tobacco Use   Smoking status: Every Day    Packs/day: 1.00    Years: 40.00    Pack years: 40.00    Types: Cigarettes    Start date: 12/01/1970   Smokeless tobacco: Never  Vaping Use   Vaping Use: Never used  Substance and Sexual Activity    Alcohol use: Yes    Alcohol/week: 0.0 standard drinks    Comment: 11/25/20: drinks a bottle of wine a day   Drug use: Yes    Types: Marijuana    Comment: occasionally to stimulate appetite   Sexual activity: Not on file  Other Topics Concern   Not on file  Social History Narrative   Not on file   Social Determinants of Health   Financial Resource Strain: Not on file  Food Insecurity: Not on file  Transportation Needs: Not on file  Physical Activity: Not on file  Stress: Not on file  Social Connections: Not on file  Intimate Partner Violence: Not on file    Family History  Problem Relation Age of Onset   Diabetes Mother    Diabetes Father    Heart attack Maternal Grandfather    Cancer Paternal Grandmother    Heart attack Maternal Uncle    Cancer Maternal Aunt    Cancer Maternal Uncle    Colon cancer Neg Hx    Pancreatic cancer Neg Hx    Stomach cancer Neg Hx     BP (!) 168/147   Pulse 88   Ht 5\' 9"  (1.753 m)   Wt 167 lb (75.8 kg)   BMI 24.66 kg/m   Body mass index is 24.66 kg/m.     Objective:   Physical Exam Vitals and nursing note reviewed. Exam conducted with a chaperone present.  Constitutional:      Appearance: He is well-developed.  HENT:     Head: Normocephalic and atraumatic.  Eyes:     Conjunctiva/sclera: Conjunctivae normal.     Pupils: Pupils are equal, round, and reactive to light.  Cardiovascular:     Rate and Rhythm: Normal rate and regular rhythm.  Pulmonary:     Effort: Pulmonary effort is normal.  Abdominal:     Palpations: Abdomen is soft.  Musculoskeletal:       Hands:     Cervical back: Normal range of motion and neck supple.  Skin:    General: Skin is warm and dry.  Neurological:     Mental Status: He is alert and oriented to person, place, and time.     Cranial Nerves: No cranial nerve deficit.     Motor: No abnormal muscle tone.     Coordination: Coordination normal.     Deep Tendon Reflexes: Reflexes are normal and  symmetric. Reflexes normal.  Psychiatric:        Behavior: Behavior normal.        Thought Content: Thought content normal.        Judgment: Judgment normal.          Assessment & Plan:   Encounter Diagnoses  Name Primary?   Pain in  right wrist Yes   Nicotine dependence, cigarettes, uncomplicated      Get EMGs done.  Return in two weeks.  Consider carpal tunnel surgery.  I will call in pain medicine.  I have reviewed the [place] web site prior to prescribing narcotic medicine for this patient.  Return to work.  Call if any problem.  Precautions discussed.  Electronically Signed Sanjuana Kava, MD 9/6/20229:32 AM

## 2021-06-07 NOTE — Patient Instructions (Addendum)
Steps to Quit Smoking Smoking tobacco is the leading cause of preventable death. It can affect almost every organ in the body. Smoking puts you and people around you at risk for many serious, long-lasting (chronic) diseases. Quitting smoking can be hard, but it is one of the best things that you can do for your health. It is never too late to quit. How do I get ready to quit? When you decide to quit smoking, make a plan to help you succeed. Before you quit: Pick a date to quit. Set a date within the next 2 weeks to give you time to prepare. Write down the reasons why you are quitting. Keep this list in places where you will see it often. Tell your family, friends, and co-workers that you are quitting. Their support is important. Talk with your doctor about the choices that may help you quit. Find out if your health insurance will pay for these treatments. Know the people, places, things, and activities that make you want to smoke (triggers). Avoid them. What first steps can I take to quit smoking? Throw away all cigarettes at home, at work, and in your car. Throw away the things that you use when you smoke, such as ashtrays and lighters. Clean your car. Make sure to empty the ashtray. Clean your home, including curtains and carpets. What can I do to help me quit smoking? Talk with your doctor about taking medicines and seeing a counselor at the same time. You are more likely to succeed when you do both. If you are pregnant or breastfeeding, talk with your doctor about counseling or other ways to quit smoking. Do not take medicine to help you quit smoking unless your doctor tells you to do so. To quit smoking: Quit right away Quit smoking totally, instead of slowly cutting back on how much you smoke over a period of time. Go to counseling. You are more likely to quit if you go to counseling sessions regularly. Take medicine You may take medicines to help you quit. Some medicines need a  prescription, and some you can buy over-the-counter. Some medicines may contain a drug called nicotine to replace the nicotine in cigarettes. Medicines may: Help you to stop having the desire to smoke (cravings). Help to stop the problems that come when you stop smoking (withdrawal symptoms). Your doctor may ask you to use: Nicotine patches, gum, or lozenges. Nicotine inhalers or sprays. Non-nicotine medicine that is taken by mouth. Find resources Find resources and other ways to help you quit smoking and remain smoke-free after you quit. These resources are most helpful when you use them often. They include: Online chats with a counselor. Phone quitlines. Printed self-help materials. Support groups or group counseling. Text messaging programs. Mobile phone apps. Use apps on your mobile phone or tablet that can help you stick to your quit plan. There are many free apps for mobile phones and tablets as well as websites. Examples include Quit Guide from the CDC and smokefree.gov  What things can I do to make it easier to quit?  Talk to your family and friends. Ask them to support and encourage you. Call a phone quitline (1-800-QUIT-NOW), reach out to support groups, or work with a counselor. Ask people who smoke to not smoke around you. Avoid places that make you want to smoke, such as: Bars. Parties. Smoke-break areas at work. Spend time with people who do not smoke. Lower the stress in your life. Stress can make you want to   smoke. Try these things to help your stress: Getting regular exercise. Doing deep-breathing exercises. Doing yoga. Meditating. Doing a body scan. To do this, close your eyes, focus on one area of your body at a time from head to toe. Notice which parts of your body are tense. Try to relax the muscles in those areas. How will I feel when I quit smoking? Day 1 to 3 weeks Within the first 24 hours, you may start to have some problems that come from quitting tobacco.  These problems are very bad 2-3 days after you quit, but they do not often last for more than 2-3 weeks. You may get these symptoms: Mood swings. Feeling restless, nervous, angry, or annoyed. Trouble concentrating. Dizziness. Strong desire for high-sugar foods and nicotine. Weight gain. Trouble pooping (constipation). Feeling like you may vomit (nausea). Coughing or a sore throat. Changes in how the medicines that you take for other issues work in your body. Depression. Trouble sleeping (insomnia). Week 3 and afterward After the first 2-3 weeks of quitting, you may start to notice more positive results, such as: Better sense of smell and taste. Less coughing and sore throat. Slower heart rate. Lower blood pressure. Clearer skin. Better breathing. Fewer sick days. Quitting smoking can be hard. Do not give up if you fail the first time. Some people need to try a few times before they succeed. Do your best to stick to your quit plan, and talk with your doctor if you have any questions or concerns. Summary Smoking tobacco is the leading cause of preventable death. Quitting smoking can be hard, but it is one of the best things that you can do for your health. When you decide to quit smoking, make a plan to help you succeed. Quit smoking right away, not slowly over a period of time. When you start quitting, seek help from your doctor, family, or friends. This information is not intended to replace advice given to you by your health care provider. Make sure you discuss any questions you have with your health care provider. Document Revised: 06/13/2019 Document Reviewed: 12/07/2018 Elsevier Patient Education  2022 [employer].   May return to work.

## 2021-06-15 ENCOUNTER — Ambulatory Visit: Payer: Medicare HMO | Admitting: Pulmonary Disease

## 2021-06-20 ENCOUNTER — Telehealth: Payer: Self-pay | Admitting: Orthopaedic Surgery

## 2021-06-20 MED ORDER — HYDROCODONE-ACETAMINOPHEN 5-325 MG PO TABS: ORAL_TABLET | ORAL | 0 refills | Status: DC

## 2021-06-20 NOTE — Telephone Encounter (Signed)
Patient wife called requesting refill for his pain medicine.    HYDROcodone-acetaminophen (NORCO/VICODIN) 5-325 MG tablet  Pharmacy: [facility] in [place]

## 2021-06-21 ENCOUNTER — Ambulatory Visit: Payer: Medicare HMO | Admitting: Orthopaedic Surgery

## 2021-06-27 DIAGNOSIS — N184 Chronic kidney disease, stage 4 (severe): Secondary | ICD-10-CM | POA: Diagnosis not present

## 2021-06-27 NOTE — Progress Notes (Deleted)
Referring Provider: Celene Squibb, MD Primary Care Physician:  Celene Squibb, MD Primary GI Physician: Dr. Gala Romney  No chief complaint on file.   HPI:   Dylan Cline is a 70 y.o. male presenting today for ***. History of GERD, remote PUD, remote alcohol induced pancreatitis, abdominal pain, weight loss. Per review of prior office visit notes, patient also has remote history of pancreatic pseudocyst and apparently underwent FNA at [facility] and repeat CT of the abdomen showing decreased size of pancreatic pseudocyst, no new cyst or discrete solid pancreatic masses. EGD in 2006 with normal esophagus, focal area of submucosal petechial hemorrhage in the fundus (likely trauma induced secondary to vomiting), otherwise normal.  Colonoscopy in 2006 with internal hemorrhoids, marginal prep.    CT A/P without contrast 11/03/2020 due to reports of lower abdominal pain with nausea/vomiting: several punctate pancreatic calcifications suggesting chronic pancreatitis, moderate stool throughout the colon, scattered diverticula without diverticulitis, several small fat-containing supraumbilical hernia, 3.1 cm infrarenal aortic aneurysm with recommendations to follow with ultrasound every 3 years.  Patient reported he was no longer having abdominal pain.  Last seen in our office 11/25/2020 for pain of the upper abdomen, and follow-up of GERD and diarrhea.  Stool studies previously negative in September 2021.  Noted GERD flare couple weeks prior and bought over-the-counter Nexium which helped.  He was taking Protonix once a day though prescribed twice daily.  Diarrhea resolved with [place] 4 BMs every other day.  No other significant GI symptoms.  Recommended PPI daily, increasing to twice daily as needed for breakthrough.  Follow-up in 6 months.  Today:    GERD:  Weight Loss:    Needs TCS, possible EGD depending on symptoms/weight loss.   Past Medical History:  Diagnosis Date   Acute type 2  diabetes mellitus with manifestations ([place])    no meds currently   CRI (chronic renal insufficiency), stage 3 (moderate) (HCC)    GERD (gastroesophageal reflux disease)    Hypertension    Mass of left parotid gland    Pancreatitis    Pancreatitis, alcoholic, acute 40/0/8676   Pancreatitis, alcoholic, acute 19/01/931   Smoker     Past Surgical History:  Procedure Laterality Date   CATARACT EXTRACTION W/PHACO Right 11/24/2019   Procedure: CATARACT EXTRACTION PHACO AND INTRAOCULAR LENS PLACEMENT ([place]);  Surgeon: [name], MD;  Location: [facility];  Service: Ophthalmology;  Laterality: Right;  CDE: 4.96   CATARACT EXTRACTION W/PHACO Left 07/26/2020   Procedure: CATARACT EXTRACTION PHACO AND INTRAOCULAR LENS PLACEMENT ([facility]);  Surgeon: [name], MD;  Location: [facility];  Service: Ophthalmology;  Laterality: Left;  CDE: 6.94   COLONOSCOPY   01/27/2005   IZT:[id number] hemorrhoids, otherwise normal rectum/Normal colon, marginal prep made the exam more difficult   ESOPHAGOGASTRODUODENOSCOPY   10/12/2004   PJA:[id number] esophagus/Focal area of submucosal petechial hemorrhage in the fundus (likely a trauma-induced phenomenon secondary to vomiting), otherwise normal   LAPAROSCOPIC GASTROTOMY W/ REPAIR OF ULCER     PAROTIDECTOMY Left 07/29/2019   Procedure: LEFT PAROTIDECTOMY;  Surgeon: Leta Baptist, MD;  Location: [facility];  Service: ENT;  Laterality: Left;    Current Outpatient Medications  Medication Sig Dispense Refill   amLODipine (NORVASC) 2.5 MG tablet Take 1 tablet (2.5 mg total) by mouth daily. 30 tablet 1   atorvastatin (LIPITOR) 40 MG tablet Take 40 mg by mouth daily.     HYDROcodone-acetaminophen (NORCO/VICODIN) 5-325 MG tablet One tablet every six hours for pain.  Limit 7 days. 25 tablet 0   JANUVIA 50 MG tablet Take 50 mg by mouth daily. (Patient not taking: Reported on 06/07/2021)     LEVEMIR FLEXTOUCH 100 UNIT/ML Pen Inject 25 Units into the skin at bedtime.       omeprazole (PRILOSEC) 40 MG capsule Take 1 capsule (40 mg total) by mouth 2 (two) times daily. (Patient taking differently: Take 40 mg by mouth daily.) 180 capsule 3   tamsulosin (FLOMAX) 0.4 MG CAPS capsule Take 0.4 mg by mouth at bedtime.     No current facility-administered medications for this visit.    Allergies as of 06/29/2021   (No Known Allergies)    Family History  Problem Relation Age of Onset   Diabetes Mother    Diabetes Father    Heart attack Maternal Grandfather    Cancer Paternal Grandmother    Heart attack Maternal Uncle    Cancer Maternal Aunt    Cancer Maternal Uncle    Colon cancer Neg Hx    Pancreatic cancer Neg Hx    Stomach cancer Neg Hx     Social History   Socioeconomic History   Marital status: Married    Spouse name: Not on file   Number of children: Not on file   Years of education: Not on file   Highest education level: Not on file  Occupational History   Not on file  Tobacco Use   Smoking status: Every Day    Packs/day: 1.00    Years: 40.00    Pack years: 40.00    Types: Cigarettes    Start date: 12/01/1970   Smokeless tobacco: Never  Vaping Use   Vaping Use: Never used  Substance and Sexual Activity   Alcohol use: Yes    Alcohol/week: 0.0 standard drinks    Comment: 11/25/20: drinks a bottle of wine a day   Drug use: Yes    Types: Marijuana    Comment: occasionally to stimulate appetite   Sexual activity: Not on file  Other Topics Concern   Not on file  Social History Narrative   Not on file   Social Determinants of Health   Financial Resource Strain: Not on file  Food Insecurity: Not on file  Transportation Needs: Not on file  Physical Activity: Not on file  Stress: Not on file  Social Connections: Not on file    Review of Systems: Gen: Denies fever, chills, anorexia. Denies fatigue, weakness, weight loss.  CV: Denies chest pain, palpitations, syncope, peripheral edema, and claudication. Resp: Denies dyspnea at rest,  cough, wheezing, coughing up blood, and pleurisy. GI: Denies vomiting blood, jaundice, and fecal incontinence.   Denies dysphagia or odynophagia. Derm: Denies rash, itching, dry skin Psych: Denies depression, anxiety, memory loss, confusion. No homicidal or suicidal ideation.  Heme: Denies bruising, bleeding, and enlarged lymph nodes.  Physical Exam: There were no vitals taken for this visit. General:   Alert and oriented. No distress noted. Pleasant and cooperative.  Head:  Normocephalic and atraumatic. Eyes:  Conjuctiva clear without scleral icterus. Mouth:  Oral mucosa pink and moist. Good dentition. No lesions. Heart:  S1, S2 present without murmurs appreciated. Lungs:  Clear to auscultation bilaterally. No wheezes, rales, or rhonchi. No distress.  Abdomen:  +BS, soft, non-tender and non-distended. No rebound or guarding. No HSM or masses noted. Msk:  Symmetrical without gross deformities. Normal posture. Extremities:  Without edema. Neurologic:  Alert and  oriented x4 Psych:  Alert and cooperative. Normal mood and  affect.

## 2021-06-28 ENCOUNTER — Encounter: Payer: Self-pay | Admitting: Physical Medicine and Rehabilitation

## 2021-06-28 ENCOUNTER — Encounter: Payer: Medicare HMO | Admitting: Physical Medicine and Rehabilitation

## 2021-06-29 ENCOUNTER — Ambulatory Visit: Payer: Medicare HMO | Admitting: Gastroenterology

## 2021-06-30 ENCOUNTER — Ambulatory Visit: Payer: Medicare HMO | Admitting: Orthopaedic Surgery

## 2021-07-04 ENCOUNTER — Other Ambulatory Visit: Payer: Self-pay

## 2021-07-04 ENCOUNTER — Encounter: Payer: Self-pay | Admitting: Pulmonary Disease

## 2021-07-04 ENCOUNTER — Ambulatory Visit (INDEPENDENT_AMBULATORY_CARE_PROVIDER_SITE_OTHER): Payer: Medicare HMO | Admitting: Pulmonary Disease

## 2021-07-04 VITALS — BP 128/84 | HR 80 | Temp 98.3°F | Ht 69.0 in | Wt 161.8 lb

## 2021-07-04 DIAGNOSIS — F17210 Nicotine dependence, cigarettes, uncomplicated: Secondary | ICD-10-CM | POA: Diagnosis not present

## 2021-07-04 DIAGNOSIS — Z720 Tobacco use: Secondary | ICD-10-CM

## 2021-07-04 DIAGNOSIS — J432 Centrilobular emphysema: Secondary | ICD-10-CM | POA: Diagnosis not present

## 2021-07-04 DIAGNOSIS — R911 Solitary pulmonary nodule: Secondary | ICD-10-CM | POA: Insufficient documentation

## 2021-07-04 MED ORDER — SPIRIVA RESPIMAT 2.5 MCG/ACT IN AERS: 2.0000 | INHALATION_SPRAY | Freq: Every day | RESPIRATORY_TRACT | 5 refills | Status: DC

## 2021-07-04 NOTE — Progress Notes (Signed)
Synopsis: Referred in 10/2018 for pulmonary lung nodules   Subjective:   PATIENT ID: Dylan Cline GENDER: male DOB: Aug 29, 1951, MRN: [id number]   HPI  Chief Complaint  Patient presents with   Follow-up    No SOB, no complaints   [name] is a 70 year old male current smoker with emphysema, HTN, DM2, hx alcoholism and pancreatitis who presents or follow-up of pulmonary nodules.  At baseline he is an active smoker, smoking 1 ppd since he was a teenager. Has shortness of breath with moderate exertion but denies limitation in activity. On initial consult in 10/2018 he had a CT Chest completed on 06/02/18 that demonstrated multiple pulmonary nodules with the largest measuring 8mm. He does not recall undergoing the CT and discussing the results with anyone. Recent lung screen with unchanged including RUL nodule ~69mm since 11/2019 (16 months ago). He reports long standing shortness of breath even before smoking. He started smoking at 70 years old. Denies cough or wheezing. He does not routinely exercise. He is limited by hip pain.   07/04/21 Since our last visit, he has tried Spiriva and may have improved on it. Minimal cough. Shortness of breath with activity. Never has had wheezing. He started a part-time job at [employer]. He will notice palpitations and dizziness with sudden movements including stooping to standing. He drinks only two glasses a day. He smokes 1 ppd daily. He is still not very active and has not returned his call to Pulmonary rehab.  Social History: Active smoker.  40-pack-year history. 1-1.5 ppd daily  Environmental exposures: Denies any known exposure.   Past Medical History:  Diagnosis Date   Acute type 2 diabetes mellitus with manifestations (HCC)    no meds currently   CRI (chronic renal insufficiency), stage 3 (moderate) (HCC)    GERD (gastroesophageal reflux disease)    Hypertension    Mass of left parotid gland    Pancreatitis    Pancreatitis,  alcoholic, acute 08/10/2014   Pancreatitis, alcoholic, acute 08/10/2014   Smoker      Family History  Problem Relation Age of Onset   Diabetes Mother    Diabetes Father    Heart attack Maternal Grandfather    Cancer Paternal Grandmother    Heart attack Maternal Uncle    Cancer Maternal Aunt    Cancer Maternal Uncle    Colon cancer Neg Hx    Pancreatic cancer Neg Hx    Stomach cancer Neg Hx      Social History   Occupational History   Not on file  Tobacco Use   Smoking status: Every Day    Packs/day: 1.00    Years: 40.00    Pack years: 40.00    Types: Cigarettes    Start date: 12/01/1970   Smokeless tobacco: Never  Vaping Use   Vaping Use: Never used  Substance and Sexual Activity   Alcohol use: Yes    Alcohol/week: 0.0 standard drinks    Comment: 11/25/20: drinks a bottle of wine a day   Drug use: Yes    Types: Marijuana    Comment: occasionally to stimulate appetite   Sexual activity: Not on file    No Known Allergies   Outpatient Medications Prior to Visit  Medication Sig Dispense Refill   amLODipine (NORVASC) 2.5 MG tablet Take 1 tablet (2.5 mg total) by mouth daily. 30 tablet 1   atorvastatin (LIPITOR) 40 MG tablet Take 40 mg by mouth daily.  HYDROcodone-acetaminophen (NORCO/VICODIN) 5-325 MG tablet One tablet every six hours for pain.  Limit 7 days. 25 tablet 0   LEVEMIR FLEXTOUCH 100 UNIT/ML Pen Inject 25 Units into the skin at bedtime.      omeprazole (PRILOSEC) 40 MG capsule Take 1 capsule (40 mg total) by mouth 2 (two) times daily. (Patient taking differently: Take 40 mg by mouth daily.) 180 capsule 3   tamsulosin (FLOMAX) 0.4 MG CAPS capsule Take 0.4 mg by mouth at bedtime.     JANUVIA 50 MG tablet Take 50 mg by mouth daily. (Patient not taking: No sig reported)     No facility-administered medications prior to visit.    Review of Systems  Constitutional:  Negative for chills, diaphoresis, fever, malaise/fatigue and weight loss.  HENT:  Negative for  congestion.   Respiratory:  Positive for shortness of breath. Negative for cough, hemoptysis, sputum production and wheezing.   Cardiovascular:  Negative for chest pain, palpitations and leg swelling.   Objective:   Vitals:   07/04/21 1544  BP: 128/84  Pulse: 80  Temp: 98.3 F (36.8 C)  TempSrc: Oral  SpO2: 98%  Weight: 161 lb 12.8 oz (73.4 kg)  Height: 5\' 9"  (1.753 m)   SpO2: 98 % O2 Device: None (Room air)  Physical Exam: General: Well-appearing, no acute distress HENT: [place], AT Eyes: EOMI, no scleral icterus Respiratory: Clear to auscultation bilaterally.  No crackles, wheezing or rales Cardiovascular: RRR, -M/R/G, no JVD Extremities:-Edema,-tenderness Neuro: AAO x4, CNII-XII grossly intact Psych: Normal mood, normal affect  Chest imaging: CT Chest 06/12/2018 [facility] (report only, no imaging available) FINDINGS:   - Lower Neck: Unremarkable.    - Heart and Vessels: Normal heart size.  No pericardial effusion. Normal caliber thoracic aorta and main pulmonary artery. Mild coronary artery calcification.  - Mediastinum, Hila and Axilla: Mildly enlarged AP window lymph node measuring 1.4 x 1.2 cm (image 37).   - Lungs: Central airways are patent. Mild predominant paraseptal and centrilobular emphysema. There is an 8 mm right upper lobe nodule adjacent to the fissure (series 102, image 148), 6 mm anterior right upper lobe nodule (image 148), 6 mm left lower  lobe nodule (image 185), and 6 mm left lower lobe nodule (image 267).   - Pleura: No pleural effusion or pneumothorax.   - Upper Abdomen: Diverticulosis.   - Musculoskeletal: No acute or aggressive osseous abnormalities. Multilevel degenerative disc disease.  - Miscellaneous: N/A. Impression: 1.  Mild emphysema. 2.  Multiple pulmonary nodules, the largest measuring 8 mm. If the patient is low risk for lung cancer, recommend follow-up CT at 3-6 months and optional follow-up CT at 18-24 months.  If the patient is high  risk for lung cancer, recommend follow-up CT  at 3-6 months and again at 18-24 months to ensure stability. (2017 Fleischner Guidelines).  3.  Nonspecific mildly enlarged AP window lymph node. This could potentially be reactive.  CT abdomen pelvis 05/24/2013-lower lobes with minimal atelectasis in the bases CXR 10/28/2011-normal chest x-ray.  No infiltrates or effusion.  CT Chest Lung Screen 03/09/21 - Stable lung nodules with largest 7.2 mm. Paraseptal and centrilobular emphysema.  PFT: None on file    Assessment & Plan:  70 year old male current smoker with emphysema, HTN, DM2 who presents for follow-up. Continues to have shortness of breath. Previously had LAMA with some benefit however ran out of sample. Discussed clinical course and management of COPD including bronchodilator regimen and action plan for exacerbation.  Emphysema/COPD GOLD Class  B - uncontrolled symptoms --RE-PRESCRIBE Spiriva 2.5 mcg TWO puffs ONCE a day. Sample and prescription ordered --Pulmonary Rehab referral placed. Patient to call back --Discussed vaccinations. Declined influenza vaccination.  Tobacco abuse Patient is an active smoker. We discussed smoking cessation for 3 minutes. We discussed triggers and stressors and ways to deal with them. We discussed barriers to continued smoking and benefits of smoking cessation. Provided patient with information cessation techniques and interventions including [place] quitline.  Multiple lung nodules - stable Continue annual CT lung screen  Health Maintenance  There is no immunization history on file for this patient. Declined influenza  No orders of the defined types were placed in this encounter. Meds ordered this encounter  Medications   Tiotropium Bromide Monohydrate (SPIRIVA RESPIMAT) 2.5 MCG/ACT AERS    Sig: Inhale 2 puffs into the lungs daily.    Dispense:  4 g    Refill:  5     Return in about 3 months (around 10/04/2021).   I have spent a total time of  35-minutes on the day of the appointment reviewing prior documentation, coordinating care and discussing medical diagnosis and plan with the patient/family. Imaging, labs and tests included in this note have been reviewed and interpreted independently by me.  Darris Staiger Mechele Collin, MD [facility] 07/04/2021 4:29 PM

## 2021-07-04 NOTE — Patient Instructions (Signed)
Emphysema/COPD GOLD Class B - symptomatic --RE-PRESCRIBE Spiriva 2.5 mcg TWO puffs ONCE a day. Sample and prescription ordered --Pulmonary Rehab referral placed. Patient to call back  Tobacco abuse QUIT SMOKING!  Multiple lung nodules - stable Continue annual CT lung screen  Follow-up me in 6 months

## 2021-07-05 ENCOUNTER — Telehealth: Payer: Self-pay | Admitting: Radiology

## 2021-07-05 ENCOUNTER — Telehealth: Payer: Self-pay | Admitting: Physical Medicine and Rehabilitation

## 2021-07-05 ENCOUNTER — Ambulatory Visit (INDEPENDENT_AMBULATORY_CARE_PROVIDER_SITE_OTHER): Payer: Medicare HMO | Admitting: Physical Medicine and Rehabilitation

## 2021-07-05 ENCOUNTER — Encounter: Payer: Self-pay | Admitting: Physical Medicine and Rehabilitation

## 2021-07-05 ENCOUNTER — Telehealth: Payer: Self-pay | Admitting: Orthopaedic Surgery

## 2021-07-05 DIAGNOSIS — R202 Paresthesia of skin: Secondary | ICD-10-CM

## 2021-07-05 MED ORDER — HYDROCODONE-ACETAMINOPHEN 5-325 MG PO TABS: ORAL_TABLET | ORAL | 0 refills | Status: DC

## 2021-07-05 MED ORDER — PREDNISONE 5 MG (21) PO TBPK: ORAL_TABLET | ORAL | 0 refills | Status: DC

## 2021-07-05 NOTE — Telephone Encounter (Signed)
Pt's wife asking if any pain meds can be called in to help with pts pain. Pt's last appt was this morning with Dr. Ernestina Patches. The best pharmacy is the one on file and the best call back number is [phone].

## 2021-07-05 NOTE — Progress Notes (Signed)
Right forearm, wrist, and hand pain. Difficulty making a fist. Numbness in fingers. First four fingers are worse. Right hand dominant No lotion per patient

## 2021-07-05 NOTE — Addendum Note (Signed)
Addended by: Willette Pa on: 07/05/2021 01:32 PM   Modules accepted: Orders

## 2021-07-05 NOTE — Telephone Encounter (Signed)
Patient came in the office and asked for refills of medication.  He got a Rx of prednisone dosepak from his primary care and that really helped him.  He is asking for a refill on this from you.  He showed me a Rx bottle dated 06/01/21.  He also said that the hydrocodone really is not helping much at all, his pain is great.    He had the nerve study this morning with Dr Ernestina Patches.  Results pending.  Per patient report he has CTS really bad in the right hand.    He wants something that will help his pain, and the prednisone is what has helped him most.  I explained to him you [facility] not be able to refill that Rx.    [facility].

## 2021-07-05 NOTE — Telephone Encounter (Signed)
Patient called requesting refill for his pain medicine.    HYDROcodone-acetaminophen (NORCO/VICODIN) 5-325 MG tablet  Pharmacy: [facility] in [place]  

## 2021-07-05 NOTE — Telephone Encounter (Signed)
Called patient's wife to advise that they need to contact Dr. Luna Glasgow or PCP about medication.

## 2021-07-06 DIAGNOSIS — E1122 Type 2 diabetes mellitus with diabetic chronic kidney disease: Secondary | ICD-10-CM | POA: Diagnosis not present

## 2021-07-06 DIAGNOSIS — E872 Acidosis, unspecified: Secondary | ICD-10-CM | POA: Diagnosis not present

## 2021-07-06 DIAGNOSIS — R809 Proteinuria, unspecified: Secondary | ICD-10-CM | POA: Diagnosis not present

## 2021-07-06 DIAGNOSIS — R399 Unspecified symptoms and signs involving the genitourinary system: Secondary | ICD-10-CM | POA: Diagnosis not present

## 2021-07-06 DIAGNOSIS — I129 Hypertensive chronic kidney disease with stage 1 through stage 4 chronic kidney disease, or unspecified chronic kidney disease: Secondary | ICD-10-CM | POA: Diagnosis not present

## 2021-07-06 DIAGNOSIS — N183 Chronic kidney disease, stage 3 unspecified: Secondary | ICD-10-CM | POA: Diagnosis not present

## 2021-07-06 DIAGNOSIS — Z720 Tobacco use: Secondary | ICD-10-CM | POA: Diagnosis not present

## 2021-07-06 NOTE — Procedures (Signed)
EMG & NCV Findings: Evaluation of the right median motor nerve showed prolonged distal onset latency (12.0 ms), reduced amplitude (0.3 mV), and decreased conduction velocity (Elbow-Wrist, 25 m/s).  The right median (across palm) sensory nerve showed no response (Wrist) and no response (Palm).  The right ulnar sensory nerve showed prolonged distal peak latency (3.8 ms), reduced amplitude (13.1 V), and decreased conduction velocity (Wrist-5th Digit, 37 m/s).  All remaining nerves (as indicated in the following tables) were within normal limits.    Needle evaluation of the right abductor pollicis brevis muscle showed increased insertional activity, increased spontaneous activity, and diminished recruitment.  All remaining muscles (as indicated in the following table) showed no evidence of electrical instability.    Impression: The above electrodiagnostic study is ABNORMAL and reveals evidence of a severe right median nerve entrapment at the wrist (carpal tunnel syndrome) affecting sensory and motor components.   There is no significant electrodiagnostic evidence of any other focal nerve entrapment, brachial plexopathy or cervical radiculopathy.   Recommendations: 1.  Follow-up with referring physician. 2.  Continue current management of symptoms. 3.  Suggest surgical evaluation.  ___________________________ Laurence Spates FAAPMR Board Certified, American Board of Physical Medicine and Rehabilitation    Nerve Conduction Studies Anti Sensory Summary Table   Stim Site NR Peak (ms) Norm Peak (ms) P-T Amp (V) Norm P-T Amp Site1 Site2 Delta-P (ms) Dist (cm) Vel (m/s) Norm Vel (m/s)  Right Median Acr Palm Anti Sensory (2nd Digit)  31.2C  Wrist *NR  <3.6  >10 Wrist Palm  0.0    Palm *NR  <2.0          Right Radial Anti Sensory (Base 1st Digit)  30.7C  Wrist    2.4 <3.1 13.7  Wrist Base 1st Digit 2.4 0.0    Right Ulnar Anti Sensory (5th Digit)  31.6C  Wrist    *3.8 <3.7 *13.1 >15.0 Wrist 5th  Digit 3.8 14.0 *37 >38   Motor Summary Table   Stim Site NR Onset (ms) Norm Onset (ms) O-P Amp (mV) Norm O-P Amp Site1 Site2 Delta-0 (ms) Dist (cm) Vel (m/s) Norm Vel (m/s)  Right Median Motor (Abd Poll Brev)  30.7C  Wrist    *12.0 <4.2 *0.3 >5 Elbow Wrist 10.2 25.5 *25 >50  Elbow    22.2  0.3         Right Ulnar Motor (Abd Dig Min)  30.9C  Wrist    3.0 <4.2 6.9 >3 B Elbow Wrist 4.3 23.0 53 >53  B Elbow    7.3  6.5  A Elbow B Elbow 1.5 10.0 67 >53  A Elbow    8.8  6.2          EMG   Side Muscle Nerve Root Ins Act Fibs Psw Amp Dur Poly Recrt Int Fraser Din Comment  Right Abd Poll Brev Median C8-T1 *Incr *3+ *3+ Nml Nml 0 *Reduced Nml   Right 1stDorInt Ulnar C8-T1 Nml Nml Nml Nml Nml 0 Nml Nml   Right PronatorTeres Median C6-7 Nml Nml Nml Nml Nml 0 Nml Nml   Right Biceps Musculocut C5-6 Nml Nml Nml Nml Nml 0 Nml Nml   Right Deltoid Axillary C5-6 Nml Nml Nml Nml Nml 0 Nml Nml     Nerve Conduction Studies Anti Sensory Left/Right Comparison   Stim Site L Lat (ms) R Lat (ms) L-R Lat (ms) L Amp (V) R Amp (V) L-R Amp (%) Site1 Site2 L Vel (m/s) R Vel (m/s) L-R Vel (m/s)  Median Acr Palm Anti Sensory (2nd Digit)  31.2C  Wrist       Wrist Palm     Palm             Radial Anti Sensory (Base 1st Digit)  30.7C  Wrist  2.4   13.7  Wrist Base 1st Digit     Ulnar Anti Sensory (5th Digit)  31.6C  Wrist  *3.8   *13.1  Wrist 5th Digit  *37    Motor Left/Right Comparison   Stim Site L Lat (ms) R Lat (ms) L-R Lat (ms) L Amp (mV) R Amp (mV) L-R Amp (%) Site1 Site2 L Vel (m/s) R Vel (m/s) L-R Vel (m/s)  Median Motor (Abd Poll Brev)  30.7C  Wrist  *12.0   *0.3  Elbow Wrist  *25   Elbow  22.2   0.3        Ulnar Motor (Abd Dig Min)  30.9C  Wrist  3.0   6.9  B Elbow Wrist  53   B Elbow  7.3   6.5  A Elbow B Elbow  67   A Elbow  8.8   6.2           Waveforms:

## 2021-07-06 NOTE — Progress Notes (Signed)
Dylan Cline - 70 y.o. male MRN [id number]  Date of birth: 05-27-1951  Office Visit Note: Visit Date: 07/05/2021 PCP: Celene Squibb, MD Referred by: Celene Squibb, MD  Subjective: Chief Complaint  Patient presents with   Right Hand - Numbness, Pain   Right Wrist - Pain   HPI:  Dylan Cline is a 70 y.o. male who comes in today at the request of Dr. Sanjuana Kava for electrodiagnostic study of the Right upper extremities.  Patient is Right hand dominant.  He reports severe progressive many year history of right hand wrist and forearm pain particularly with numbness and tingling in the radial digits including the radial half of the fourth digit.  He reports difficulty making a fist.  He reports a lot of weakness in the right hand.  He does have a positive flick sign at night.  He does have nocturnal complaints.  He denies any specific radicular complaints.  No left-sided complaints.  He has no prior electrodiagnostic studies.  He does have a history of diabetes without history of neuropathy.   ROS Otherwise per HPI.  Assessment & Plan: Visit Diagnoses:    ICD-10-CM   1. Paresthesia of skin  R20.2 NCV with EMG (electromyography)      Plan: Impression: The above electrodiagnostic study is ABNORMAL and reveals evidence of a severe right median nerve entrapment at the wrist (carpal tunnel syndrome) affecting sensory and motor components.   There is no significant electrodiagnostic evidence of any other focal nerve entrapment, brachial plexopathy or cervical radiculopathy.   Recommendations: 1.  Follow-up with referring physician. 2.  Continue current management of symptoms. 3.  Suggest surgical evaluation.  Meds & Orders: No orders of the defined types were placed in this encounter.   Orders Placed This Encounter  Procedures   NCV with EMG (electromyography)    Follow-up: Return in about 2 weeks (around 07/19/2021) for Sanjuana Kava, MD.   Procedures: No procedures performed   EMG & NCV Findings: Evaluation of the right median motor nerve showed prolonged distal onset latency (12.0 ms), reduced amplitude (0.3 mV), and decreased conduction velocity (Elbow-Wrist, 25 m/s).  The right median (across palm) sensory nerve showed no response (Wrist) and no response (Palm).  The right ulnar sensory nerve showed prolonged distal peak latency (3.8 ms), reduced amplitude (13.1 V), and decreased conduction velocity (Wrist-5th Digit, 37 m/s).  All remaining nerves (as indicated in the following tables) were within normal limits.    Needle evaluation of the right abductor pollicis brevis muscle showed increased insertional activity, increased spontaneous activity, and diminished recruitment.  All remaining muscles (as indicated in the following table) showed no evidence of electrical instability.    Impression: The above electrodiagnostic study is ABNORMAL and reveals evidence of a severe right median nerve entrapment at the wrist (carpal tunnel syndrome) affecting sensory and motor components.   There is no significant electrodiagnostic evidence of any other focal nerve entrapment, brachial plexopathy or cervical radiculopathy.   Recommendations: 1.  Follow-up with referring physician. 2.  Continue current management of symptoms. 3.  Suggest surgical evaluation.  ___________________________ Laurence Spates FAAPMR Board Certified, American Board of Physical Medicine and Rehabilitation    Nerve Conduction Studies Anti Sensory Summary Table   Stim Site NR Peak (ms) Norm Peak (ms) P-T Amp (V) Norm P-T Amp Site1 Site2 Delta-P (ms) Dist (cm) Vel (m/s) Norm Vel (m/s)  Right Median Acr Palm Anti Sensory (2nd Digit)  31.2C  Wrist *NR  <  3.6  >10 Wrist Palm  0.0    Palm *NR  <2.0          Right Radial Anti Sensory (Base 1st Digit)  30.7C  Wrist    2.4 <3.1 13.7  Wrist Base 1st Digit 2.4 0.0    Right Ulnar Anti Sensory (5th Digit)  31.6C  Wrist    *3.8 <3.7 *13.1 >15.0 Wrist 5th  Digit 3.8 14.0 *37 >38   Motor Summary Table   Stim Site NR Onset (ms) Norm Onset (ms) O-P Amp (mV) Norm O-P Amp Site1 Site2 Delta-0 (ms) Dist (cm) Vel (m/s) Norm Vel (m/s)  Right Median Motor (Abd Poll Brev)  30.7C  Wrist    *12.0 <4.2 *0.3 >5 Elbow Wrist 10.2 25.5 *25 >50  Elbow    22.2  0.3         Right Ulnar Motor (Abd Dig Min)  30.9C  Wrist    3.0 <4.2 6.9 >3 B Elbow Wrist 4.3 23.0 53 >53  B Elbow    7.3  6.5  A Elbow B Elbow 1.5 10.0 67 >53  A Elbow    8.8  6.2          EMG   Side Muscle Nerve Root Ins Act Fibs Psw Amp Dur Poly Recrt Int Fraser Din Comment  Right Abd Poll Brev Median C8-T1 *Incr *3+ *3+ Nml Nml 0 *Reduced Nml   Right 1stDorInt Ulnar C8-T1 Nml Nml Nml Nml Nml 0 Nml Nml   Right PronatorTeres Median C6-7 Nml Nml Nml Nml Nml 0 Nml Nml   Right Biceps Musculocut C5-6 Nml Nml Nml Nml Nml 0 Nml Nml   Right Deltoid Axillary C5-6 Nml Nml Nml Nml Nml 0 Nml Nml     Nerve Conduction Studies Anti Sensory Left/Right Comparison   Stim Site L Lat (ms) R Lat (ms) L-R Lat (ms) L Amp (V) R Amp (V) L-R Amp (%) Site1 Site2 L Vel (m/s) R Vel (m/s) L-R Vel (m/s)  Median Acr Palm Anti Sensory (2nd Digit)  31.2C  Wrist       Wrist Palm     Palm             Radial Anti Sensory (Base 1st Digit)  30.7C  Wrist  2.4   13.7  Wrist Base 1st Digit     Ulnar Anti Sensory (5th Digit)  31.6C  Wrist  *3.8   *13.1  Wrist 5th Digit  *37    Motor Left/Right Comparison   Stim Site L Lat (ms) R Lat (ms) L-R Lat (ms) L Amp (mV) R Amp (mV) L-R Amp (%) Site1 Site2 L Vel (m/s) R Vel (m/s) L-R Vel (m/s)  Median Motor (Abd Poll Brev)  30.7C  Wrist  *12.0   *0.3  Elbow Wrist  *25   Elbow  22.2   0.3        Ulnar Motor (Abd Dig Min)  30.9C  Wrist  3.0   6.9  B Elbow Wrist  53   B Elbow  7.3   6.5  A Elbow B Elbow  67   A Elbow  8.8   6.2           Waveforms:            Clinical History: No specialty comments available.     Objective:  VS:  HT:    WT:   BMI:     BP:   HR: bpm   TEMP: ( )  RESP:  Physical Exam Musculoskeletal:        General: No tenderness.     Comments: Inspection reveals some atrophy of the right APB but no atrophy of the Left APB or bilateral FDI or hand intrinsics. There is no swelling, color changes, allodynia or dystrophic changes.  He does hold his right hand and somewhat of an "ape" hand deformity.  There is 5 out of 5 strength in the bilateral wrist extension, finger abduction and long finger flexion. There is Decreased sensation to light touch in the right median nerve distribution. There is a negative Froment's test bilaterally. There is a negative Phalen's test on the right. There is a negative Hoffmann's test bilaterally.  Skin:    General: Skin is warm and dry.     Findings: No erythema or rash.  Neurological:     General: No focal deficit present.     Mental Status: He is alert and oriented to person, place, and time.     Sensory: No sensory deficit.     Motor: No weakness or abnormal muscle tone.     Coordination: Coordination normal.     Gait: Gait normal.  Psychiatric:        Mood and Affect: Mood normal.        Behavior: Behavior normal.        Thought Content: Thought content normal.     Imaging: No results found.

## 2021-07-12 ENCOUNTER — Ambulatory Visit (INDEPENDENT_AMBULATORY_CARE_PROVIDER_SITE_OTHER): Payer: Medicare HMO | Admitting: Orthopaedic Surgery

## 2021-07-12 ENCOUNTER — Other Ambulatory Visit: Payer: Self-pay

## 2021-07-12 ENCOUNTER — Encounter: Payer: Self-pay | Admitting: Orthopaedic Surgery

## 2021-07-12 VITALS — BP 133/67 | HR 60 | Ht 69.0 in | Wt 167.2 lb

## 2021-07-12 DIAGNOSIS — G5601 Carpal tunnel syndrome, right upper limb: Secondary | ICD-10-CM | POA: Diagnosis not present

## 2021-07-12 DIAGNOSIS — F17210 Nicotine dependence, cigarettes, uncomplicated: Secondary | ICD-10-CM

## 2021-07-12 MED ORDER — PREDNISONE 5 MG (21) PO TBPK: ORAL_TABLET | ORAL | 0 refills | Status: DC

## 2021-07-12 NOTE — Patient Instructions (Signed)

## 2021-07-12 NOTE — Progress Notes (Signed)
My hand is still numb  He had EMGs showing severe carpal tunnel on the right.  I have explained the findings to him.  I have reviewed Dr. Romona Curls report.  I have told the patient he will need surgical release.  I will have him see Dr. Aline Brochure or Dr. Amedeo Kinsman.  He has decreased sensation of right median nerve and positive Phalens on the right.  Encounter Diagnoses  Name Primary?   Carpal tunnel syndrome, right upper limb Yes   Nicotine dependence, cigarettes, uncomplicated    To see Dr. Lemmie Evens or Dr. Loletha Grayer.  I have refilled the prednisone which helped a lot.  Cut back on smoking.  Call if any problem.  Precautions discussed.  Electronically Signed Sanjuana Kava, MD 10/11/20222:42 PM

## 2021-07-20 ENCOUNTER — Ambulatory Visit: Payer: Medicare HMO | Admitting: Orthopedic Surgery

## 2021-07-20 ENCOUNTER — Other Ambulatory Visit: Payer: Self-pay

## 2021-07-20 ENCOUNTER — Encounter: Payer: Self-pay | Admitting: Orthopedic Surgery

## 2021-07-20 VITALS — BP 163/83 | HR 62 | Ht 69.0 in | Wt 164.8 lb

## 2021-07-20 DIAGNOSIS — M65331 Trigger finger, right middle finger: Secondary | ICD-10-CM

## 2021-07-20 DIAGNOSIS — M65341 Trigger finger, right ring finger: Secondary | ICD-10-CM | POA: Diagnosis not present

## 2021-07-20 DIAGNOSIS — G5601 Carpal tunnel syndrome, right upper limb: Secondary | ICD-10-CM

## 2021-07-20 MED ORDER — HYDROCODONE-ACETAMINOPHEN 5-325 MG PO TABS
1.0000 | ORAL_TABLET | Freq: Four times a day (QID) | ORAL | 0 refills | Status: AC | PRN
Start: 2021-07-20 — End: 2021-07-27

## 2021-07-20 NOTE — Progress Notes (Signed)
New Patient Visit  Assessment: Dylan Cline is a 70 y.o. male with the following: 1. Carpal tunnel syndrome, right upper limb 2. Trigger finger, right middle finger 3. Trigger finger, right ring finger  Plan: Patient has numbness and tingling in the right median nerve distribution, which has been ongoing for several years.  He does have some atrophy of the thenar eminence.  He does have some weakness of his APB.  EMGs confirm severe right carpal tunnel syndrome.  In addition, he complains of triggering of the long and ring fingers.  On physical exam in clinic today, he did have active triggering.  I discussed a carpal tunnel release, as well as trigger finger release to the middle and ring fingers.  I discussed the procedures in great detail.  All questions were answered.  Due to the severity of his symptoms, I explained to him that he may not regain all sensation in the median nerve distribution.  However, it should cease to progress.  I also explained to him, that this can take up to a year before we can definitively say that he has resolution of his symptoms.  I do think you will benefit.  However, he needs to be aware that he may not regain full function, and he may not recover all of his strength.  General risks were otherwise discussed.  Patient does have some medical issues, including diabetes, and he smokes on a daily basis.  As a result, he is at increased risk for some wound healing issues.  He is to obtain medical clearance, and we will send documentation to his primary care physician's office.  Once we have medical clearance, we can work to schedule a date.  He is also been taking hydrocodone for the pain in his right hand.  Advised him that I will continue to provide this medication, but we will work to get him off this medication following surgery.  He stated his understanding.   I also explicitly told him that I do not think that this surgery will improve all of his discomfort at this  time.  He is complaining some ulnar-sided wrist pain, but has no not think this will change with a carpal tunnel release.   Follow-up: Return for After medical clearance for OR.  Subjective:  Chief Complaint  Patient presents with   Carpal Tunnel    Right, to discuss surgery    History of Present Illness: Dylan Cline is a 70 y.o. male who presents for evaluation of right hand pain, numbness and tingling.  He is previously been evaluated by Dr. Luna Glasgow.  He has had EMGs, which demonstrates severe right carpal tunnel syndrome.  This is been ongoing for him for many years.  He has tried bracing and medications without improvement in his symptoms.  He is complaining of pain in his right hand at this time.  He also has some triggering of the long and ring fingers.  This is getting worse.  It is worse in the morning.   Review of Systems: No fevers or chills Numbness and tingling to the right hand. No chest pain No shortness of breath No bowel or bladder dysfunction No GI distress No headaches   Medical History:  Past Medical History:  Diagnosis Date   Acute type 2 diabetes mellitus with manifestations ([place])    no meds currently   CRI (chronic renal insufficiency), stage 3 (moderate) ([facility])    GERD (gastroesophageal reflux disease)    Hypertension  Mass of left parotid gland    Pancreatitis    Pancreatitis, alcoholic, acute 97/12/1636   Pancreatitis, alcoholic, acute 45/12/6466   Smoker     Past Surgical History:  Procedure Laterality Date   CATARACT EXTRACTION W/PHACO Right 11/24/2019   Procedure: CATARACT EXTRACTION PHACO AND INTRAOCULAR LENS PLACEMENT ([place]);  Surgeon: [name], MD;  Location: [facility];  Service: Ophthalmology;  Laterality: Right;  CDE: 4.96   CATARACT EXTRACTION W/PHACO Left 07/26/2020   Procedure: CATARACT EXTRACTION PHACO AND INTRAOCULAR LENS PLACEMENT ([facility]);  Surgeon: [name], MD;  Location: [facility];  Service: Ophthalmology;  Laterality:  Left;  CDE: 6.94   COLONOSCOPY   01/27/2005   EHO:[id number] hemorrhoids, otherwise normal rectum/Normal colon, marginal prep made the exam more difficult   ESOPHAGOGASTRODUODENOSCOPY   10/12/2004   IBB:CWUGQB esophagus/Focal area of submucosal petechial hemorrhage in the fundus (likely a trauma-induced phenomenon secondary to vomiting), otherwise normal   LAPAROSCOPIC GASTROTOMY W/ REPAIR OF ULCER     PAROTIDECTOMY Left 07/29/2019   Procedure: LEFT PAROTIDECTOMY;  Surgeon: Leta Baptist, MD;  Location: [place];  Service: ENT;  Laterality: Left;    Family History  Problem Relation Age of Onset   Diabetes Mother    Diabetes Father    Heart attack Maternal Grandfather    Cancer Paternal Grandmother    Heart attack Maternal Uncle    Cancer Maternal Aunt    Cancer Maternal Uncle    Colon cancer Neg Hx    Pancreatic cancer Neg Hx    Stomach cancer Neg Hx    Social History   Tobacco Use   Smoking status: Every Day    Packs/day: 1.00    Years: 40.00    Pack years: 40.00    Types: Cigarettes    Start date: 12/01/1970   Smokeless tobacco: Never  Vaping Use   Vaping Use: Never used  Substance Use Topics   Alcohol use: Yes    Alcohol/week: 0.0 standard drinks    Comment: 11/25/20: drinks a bottle of wine a day   Drug use: Yes    Types: Marijuana    Comment: occasionally to stimulate appetite    No Known Allergies  Current Meds  Medication Sig   amLODipine (NORVASC) 2.5 MG tablet Take 1 tablet (2.5 mg total) by mouth daily.   atorvastatin (LIPITOR) 40 MG tablet Take 40 mg by mouth daily.   HYDROcodone-acetaminophen (NORCO/VICODIN) 5-325 MG tablet Take 1 tablet by mouth every 6 (six) hours as needed for up to 7 days for moderate pain.   JANUVIA 50 MG tablet Take 50 mg by mouth daily.   LEVEMIR FLEXTOUCH 100 UNIT/ML Pen Inject 25 Units into the skin at bedtime.    omeprazole (PRILOSEC) 40 MG capsule Take 1 capsule (40 mg total) by mouth 2 (two) times daily. (Patient  taking differently: Take 40 mg by mouth daily.)   predniSONE (STERAPRED UNI-PAK 21 TAB) 5 MG (21) TBPK tablet Take 6 pills first day; 5 pills second day; 4 pills third day; 3 pills fourth day; 2 pills next day and 1 pill last day.   tamsulosin (FLOMAX) 0.4 MG CAPS capsule Take 0.4 mg by mouth at bedtime.   Tiotropium Bromide Monohydrate (SPIRIVA RESPIMAT) 2.5 MCG/ACT AERS Inhale 2 puffs into the lungs daily.   [DISCONTINUED] HYDROcodone-acetaminophen (NORCO/VICODIN) 5-325 MG tablet One tablet every six hours for pain.  Limit 7 days.    Objective: BP (!) 163/83   Pulse 62   Ht 5\' 9"  (1.753 m)  Wt 164 lb 12.8 oz (74.8 kg)   BMI 24.34 kg/m   Physical Exam:  General: Alert and oriented. and No acute distress. Gait: Normal gait.  Evaluation of right hand demonstrates decreased sensation to the thumb, index and long finger.  There is some noticeable atrophy to the thenar eminence.  4/5 strength in the APB.  Negative Tinel's.  4+/5 grip strength.  Active triggering of the long and the ring finger.  No tenderness to palpation overlying the A1 pulley to the index or small finger.  No skin lesions.  2+ radial pulse.  IMAGING: No new imaging obtained today   New Medications:  Meds ordered this encounter  Medications   HYDROcodone-acetaminophen (NORCO/VICODIN) 5-325 MG tablet    Sig: Take 1 tablet by mouth every 6 (six) hours as needed for up to 7 days for moderate pain.    Dispense:  25 tablet    Refill:  0      Mordecai Rasmussen, MD  07/20/2021 9:59 PM

## 2021-08-01 ENCOUNTER — Telehealth: Payer: Self-pay

## 2021-08-01 NOTE — Telephone Encounter (Signed)
Patient's wife called to find out when patient can be scheduled for surgery. I explained that you would give them a call when you returned to the office and discuss this.

## 2021-08-02 NOTE — Telephone Encounter (Signed)
LVM for a call back

## 2021-08-03 ENCOUNTER — Telehealth: Payer: Self-pay | Admitting: Orthopedic Surgery

## 2021-08-03 NOTE — Telephone Encounter (Signed)
Call received from [facility] at Dr Jenny Reichmann Z.Hall's office, [phone], relaying that patient is in their office. States they have received the clearance letter sent by our office; said patient's last labs were done in August. Patient is concerned about having to possibly repeat labs. Jeanie Sewer said she will print off the information; patient may call us for further advice.

## 2021-08-03 NOTE — Telephone Encounter (Signed)
Spoke with pt wife and let her know that I faxed clearance letter to Dr. Nevada Crane for surgical approval. Let her know that sometimes pt's have to reach out to PCP to see if anything is needed. Wife verbalized understanding and will call PCP.

## 2021-08-05 NOTE — Telephone Encounter (Signed)
Nurse and Dr Amedeo Kinsman aware - as of 08/05/21 - per nurse, okay to schedule back in office. Called patient.

## 2021-08-08 ENCOUNTER — Emergency Department (HOSPITAL_BASED_OUTPATIENT_CLINIC_OR_DEPARTMENT_OTHER)
Admission: EM | Admit: 2021-08-08 | Discharge: 2021-08-08 | Discharge status: Against Medical Advice | Payer: Medicare HMO

## 2021-08-08 ENCOUNTER — Other Ambulatory Visit: Payer: Self-pay

## 2021-08-08 DIAGNOSIS — I129 Hypertensive chronic kidney disease with stage 1 through stage 4 chronic kidney disease, or unspecified chronic kidney disease: Secondary | ICD-10-CM | POA: Diagnosis not present

## 2021-08-08 NOTE — ED Notes (Signed)
Patient called Several More Times with no Response. Patient not visualized in ED Lobby and will be discharged accordingly.

## 2021-08-08 NOTE — ED Notes (Signed)
Patient called Once with No Response.

## 2021-08-10 ENCOUNTER — Ambulatory Visit: Payer: Medicare HMO

## 2021-08-10 ENCOUNTER — Encounter: Payer: Self-pay | Admitting: Orthopedic Surgery

## 2021-08-10 ENCOUNTER — Ambulatory Visit: Payer: Medicare HMO | Admitting: Orthopedic Surgery

## 2021-08-10 ENCOUNTER — Other Ambulatory Visit: Payer: Self-pay

## 2021-08-10 VITALS — BP 169/86 | HR 82 | Ht 69.0 in | Wt 164.0 lb

## 2021-08-10 DIAGNOSIS — G5601 Carpal tunnel syndrome, right upper limb: Secondary | ICD-10-CM

## 2021-08-10 DIAGNOSIS — M65331 Trigger finger, right middle finger: Secondary | ICD-10-CM

## 2021-08-10 DIAGNOSIS — M79641 Pain in right hand: Secondary | ICD-10-CM

## 2021-08-10 DIAGNOSIS — Z01818 Encounter for other preprocedural examination: Secondary | ICD-10-CM

## 2021-08-10 DIAGNOSIS — M65341 Trigger finger, right ring finger: Secondary | ICD-10-CM | POA: Diagnosis not present

## 2021-08-10 DIAGNOSIS — R008 Other abnormalities of heart beat: Secondary | ICD-10-CM | POA: Diagnosis not present

## 2021-08-10 DIAGNOSIS — R000 Tachycardia, unspecified: Secondary | ICD-10-CM | POA: Diagnosis not present

## 2021-08-10 DIAGNOSIS — R011 Cardiac murmur, unspecified: Secondary | ICD-10-CM | POA: Diagnosis not present

## 2021-08-10 NOTE — Patient Instructions (Signed)
Carpal tunnel release  Trigger finger release

## 2021-08-10 NOTE — H&P (View-Only) (Signed)
Return patient Visit  Assessment: Dylan Cline is a 70 y.o. male with the following: 1. Carpal tunnel syndrome, right upper limb 2. Trigger finger, right middle finger 3. Trigger finger, right ring finger  Plan: Patient return to clinic today for further discussion regarding surgery.  His wife also attended the clinic visit today.  We obtained radiographs today which demonstrates degenerative changes throughout the right hand.  No acute injuries are noted.  He continues to have pain, numbness and tingling in the median nerve distribution.  He also has triggering of the long and the ring fingers.  Surgery for carpal tunnel release, as well as trigger finger release to the long and ring fingers was discussed.  The risks and benefits were once again discussed.  All questions were answered.  The patient is aware that I would not be able to address all symptoms associated with his right hand.  I do think he would benefit from this procedure, and will experience less triggering, as well as the numbness and tingling he is experiencing in the median nerve distribution.  However, I made no guarantees in regards to his strength.  In addition, it may take several months for his symptoms to fully resolve.  He stated his understanding.  He would like to proceed with surgery in November 21.  Follow-up: Return for After surgery; 2 weeks postop, DOS 08/22/21.  Subjective:  Chief Complaint  Patient presents with   Consultation    CTS RT    History of Present Illness: Dylan Cline is a 70 y.o. male who presents for evaluation of right hand pain, numbness and tingling.  He returns today, having previously obtained medical clearance for surgery.  He continues to have pain, numbness and tingling in the median distribution of the right hand.  He also has triggering.  In addition, he has noted swelling to the right thumb.  He does have some pain at the base of his right thumb.  He also has pain at the ulnar  styloid.  He is interested in proceeding with surgery, and presented today for further surgical discussion.   Review of Systems: No fevers or chills Numbness and tingling to the right hand. No chest pain No shortness of breath No bowel or bladder dysfunction No GI distress No headaches    Objective: BP (!) 169/86   Pulse 82   Ht 5\' 9"  (1.753 m)   Wt 164 lb (74.4 kg)   BMI 24.22 kg/m   Physical Exam:  General: Alert and oriented. and No acute distress. Gait: Normal gait.  Evaluation of right hand demonstrates decreased sensation to the thumb, index and long finger.  There is some noticeable atrophy to the thenar eminence.  4/5 strength in the APB.  Negative Tinel's.  4+/5 grip strength.  Active triggering of the long and the ring finger.  No tenderness to palpation overlying the A1 pulley to the index or small finger.  No skin lesions.  2+ radial pulse.  IMAGING:  X-rays of the right hand were obtained in clinic today.  There are no acute injuries.  Diffuse degenerative changes throughout all of his fingers.  He has some osteophytes at his wrist.  Possible previous injury to the distal radius.  He has some degenerative changes at the distal aspect of the ulna.  No dislocations are noted.  Impression: Right hand x-rays with diffuse degenerative changes.  New Medications:  No orders of the defined types were placed in this encounter.  Mordecai Rasmussen, MD  08/10/2021 10:28 PM

## 2021-08-10 NOTE — Progress Notes (Signed)
Return patient Visit  Assessment: Dylan Cline is a 70 y.o. male with the following: 1. Carpal tunnel syndrome, right upper Cline 2. Trigger finger, right middle finger 3. Trigger finger, right ring finger  Plan: Patient return to clinic today for further discussion regarding surgery.  His wife also attended the clinic visit today.  We obtained radiographs today which demonstrates degenerative changes throughout the right hand.  No acute injuries are noted.  He continues to have pain, numbness and tingling in the median nerve distribution.  He also has triggering of the long and the ring fingers.  Surgery for carpal tunnel release, as well as trigger finger release to the long and ring fingers was discussed.  The risks and benefits were once again discussed.  All questions were answered.  The patient is aware that I would not be able to address all symptoms associated with his right hand.  I do think he would benefit from this procedure, and will experience less triggering, as well as the numbness and tingling he is experiencing in the median nerve distribution.  However, I made no guarantees in regards to his strength.  In addition, it may take several months for his symptoms to fully resolve.  He stated his understanding.  He would like to proceed with surgery in November 21.  Follow-up: Return for After surgery; 2 weeks postop, DOS 08/22/21.  Subjective:  Chief Complaint  Patient presents with   Consultation    CTS RT    History of Present Illness: Dylan Cline is a 70 y.o. male who presents for evaluation of right hand pain, numbness and tingling.  He returns today, having previously obtained medical clearance for surgery.  He continues to have pain, numbness and tingling in the median distribution of the right hand.  He also has triggering.  In addition, he has noted swelling to the right thumb.  He does have some pain at the base of his right thumb.  He also has pain at the ulnar  styloid.  He is interested in proceeding with surgery, and presented today for further surgical discussion.   Review of Systems: No fevers or chills Numbness and tingling to the right hand. No chest pain No shortness of breath No bowel or bladder dysfunction No GI distress No headaches    Objective: BP (!) 169/86   Pulse 82   Ht 5\' 9"  (1.753 m)   Wt 164 lb (74.4 kg)   BMI 24.22 kg/m   Physical Exam:  General: Alert and oriented. and No acute distress. Gait: Normal gait.  Evaluation of right hand demonstrates decreased sensation to the thumb, index and long finger.  There is some noticeable atrophy to the thenar eminence.  4/5 strength in the APB.  Negative Tinel's.  4+/5 grip strength.  Active triggering of the long and the ring finger.  No tenderness to palpation overlying the A1 pulley to the index or small finger.  No skin lesions.  2+ radial pulse.  IMAGING:  X-rays of the right hand were obtained in clinic today.  There are no acute injuries.  Diffuse degenerative changes throughout all of his fingers.  He has some osteophytes at his wrist.  Possible previous injury to the distal radius.  He has some degenerative changes at the distal aspect of the ulna.  No dislocations are noted.  Impression: Right hand x-rays with diffuse degenerative changes.  New Medications:  No orders of the defined types were placed in this encounter.  Mordecai Rasmussen, MD  08/10/2021 10:28 PM

## 2021-08-15 ENCOUNTER — Ambulatory Visit: Payer: Medicare HMO | Admitting: Orthopedic Surgery

## 2021-08-18 ENCOUNTER — Encounter (HOSPITAL_COMMUNITY)
Admission: RE | Admit: 2021-08-18 | Discharge: 2021-08-18 | Disposition: A | Discharge status: Home / Self Care | Payer: Medicare HMO | Source: Ambulatory Visit | Attending: Orthopedic Surgery | Admitting: Orthopedic Surgery

## 2021-08-18 ENCOUNTER — Other Ambulatory Visit: Payer: Self-pay

## 2021-08-18 DIAGNOSIS — Z01818 Encounter for other preprocedural examination: Secondary | ICD-10-CM | POA: Insufficient documentation

## 2021-08-18 LAB — CBC
HCT: 41.4 % (ref 39.0–52.0)
Hemoglobin: 14 g/dL (ref 13.0–17.0)
MCH: 34.9 pg — ABNORMAL HIGH (ref 26.0–34.0)
MCHC: 33.8 g/dL (ref 30.0–36.0)
MCV: 103.2 fL — ABNORMAL HIGH (ref 80.0–100.0)
Platelets: 307 10*3/uL (ref 150–400)
RBC: 4.01 MIL/uL — ABNORMAL LOW (ref 4.22–5.81)
RDW: 13 % (ref 11.5–15.5)
WBC: 9.3 10*3/uL (ref 4.0–10.5)
nRBC: 0 % (ref 0.0–0.2)

## 2021-08-18 LAB — BASIC METABOLIC PANEL
Anion gap: 9 (ref 5–15)
BUN: 20 mg/dL (ref 8–23)
CO2: 19 mmol/L — ABNORMAL LOW (ref 22–32)
Calcium: 9 mg/dL (ref 8.9–10.3)
Chloride: 109 mmol/L (ref 98–111)
Creatinine, Ser: 2.24 mg/dL — ABNORMAL HIGH (ref 0.61–1.24)
GFR, Estimated: 31 mL/min — ABNORMAL LOW (ref >60)
Glucose, Bld: 74 mg/dL (ref 70–99)
Potassium: 4.3 mmol/L (ref 3.5–5.1)
Sodium: 137 mmol/L (ref 135–145)

## 2021-08-18 NOTE — Patient Instructions (Signed)
Dylan Cline  08/18/2021     @PREFPERIOPPHARMACY @   Your procedure is scheduled on 08/22/2021.  Report to [facility] at 7:15 A.M.  Call this number if you have problems the morning of surgery:  [phone]   Remember:  Do not eat or drink after midnight.    Take these medicines the morning of surgery with A SIP OF WATER : Norvasc, Flomax and Prilosec.  Please use your inhaler before coming to the hospital.    Do not wear jewelry, make-up or nail polish.  Do not wear lotions, powders, or perfumes, or deodorant.  Do not shave 48 hours prior to surgery.  Men may shave face and neck.  Do not bring valuables to the hospital.  [facility] is not responsible for any belongings or valuables.  Contacts, dentures or bridgework may not be worn into surgery.  Leave your suitcase in the car.  After surgery it may be brought to your room.  For patients admitted to the hospital, discharge time will be determined by your treatment team.  Patients discharged the day of surgery will not be allowed to drive home.   Name and phone number of your driver:   family Special instructions:  N/A  Please read over the following fact sheets that you were given. Care and Recovery After Surgery  Carpal Tunnel Syndrome Carpal tunnel syndrome is a condition that causes pain, numbness, and weakness in your hand and fingers. The carpal tunnel is a narrow area located on the palm side of your wrist. Repeated wrist motion or certain diseases may cause swelling within the tunnel. This swelling pinches the main nerve in the wrist. The main nerve in the wrist is called the median nerve. What are the causes? This condition may be caused by: Repeated and forceful wrist and hand motions. Wrist injuries. Arthritis. A cyst or tumor in the carpal tunnel. Fluid buildup during pregnancy. Use of tools that vibrate. Sometimes the cause of this condition is not known. What increases the risk? The following factors  may make you more likely to develop this condition: Having a job that requires you to repeatedly or forcefully move your wrist or hand or requires you to use tools that vibrate. This may include jobs that involve using computers, working on an [employer], or working with [place] such as Pension scheme manager. Being a woman. Having certain conditions, such as: Diabetes. Obesity. An underactive thyroid (hypothyroidism). Kidney failure. Rheumatoid arthritis. What are the signs or symptoms? Symptoms of this condition include: A tingling feeling in your fingers, especially in your thumb, index, and middle fingers. Tingling or numbness in your hand. An aching feeling in your entire arm, especially when your wrist and elbow are bent for a long time. Wrist pain that goes up your arm to your shoulder. Pain that goes down into your palm or fingers. A weak feeling in your hands. You may have trouble grabbing and holding items. Your symptoms may feel worse during the night. How is this diagnosed? This condition is diagnosed with a medical history and physical exam. You may also have tests, including: Electromyogram (EMG). This test measures electrical signals sent by your nerves into the muscles. Nerve conduction study. This test measures how well electrical signals pass through your nerves. Imaging tests, such as X-rays, ultrasound, and MRI. These tests check for possible causes of your condition. How is this treated? This condition may be treated with: Lifestyle changes. It is important to stop or  change the activity that caused your condition. Doing exercise and activities to strengthen and stretch your muscles and tendons (physical therapy). Making lifestyle changes to help with your condition and learning how to do your daily activities safely (occupational therapy). Medicines for pain and inflammation. This may include medicine that is injected into your wrist. A wrist splint or  brace. Surgery. Follow these instructions at home: If you have a splint or brace: Wear the splint or brace as told by your health care provider. Remove it only as told by your health care provider. Loosen the splint or brace if your fingers tingle, become numb, or turn cold and blue. Keep the splint or brace clean. If the splint or brace is not waterproof: Do not let it get wet. Cover it with a watertight covering when you take a bath or shower. Managing pain, stiffness, and swelling If directed, put ice on the painful area. To do this: If you have a removeable splint or brace, remove it as told by your health care provider. Put ice in a plastic bag. Place a towel between your skin and the bag or between the splint or brace and the bag. Leave the ice on for 20 minutes, 2-3 times a day. Do not fall asleep with the cold pack on your skin. Remove the ice if your skin turns bright red. This is very important. If you cannot feel pain, heat, or cold, you have a greater risk of damage to the area. Move your fingers often to reduce stiffness and swelling. General instructions Take over-the-counter and prescription medicines only as told by your health care provider. Rest your wrist and hand from any activity that may be causing your pain. If your condition is work related, talk with your employer about changes that can be made, such as getting a wrist pad to use while typing. Do any exercises as told by your health care provider, physical therapist, or occupational therapist. Keep all follow-up visits. This is important. Contact a health care provider if: You have new symptoms. Your pain is not controlled with medicines. Your symptoms get worse. Get help right away if: You have severe numbness or tingling in your wrist or hand. Summary Carpal tunnel syndrome is a condition that causes pain, numbness, and weakness in your hand and fingers. It is usually caused by repeated wrist  motions. Lifestyle changes and medicines are used to treat carpal tunnel syndrome. Surgery may be recommended. Follow your health care provider's instructions about wearing a splint, resting from activity, keeping follow-up visits, and calling for help. This information is not intended to replace advice given to you by your health care provider. Make sure you discuss any questions you have with your health care provider. Document Revised: 01/29/2020 Document Reviewed: 01/29/2020 Elsevier Patient Education  [place].

## 2021-08-19 ENCOUNTER — Other Ambulatory Visit: Payer: Self-pay

## 2021-08-22 ENCOUNTER — Ambulatory Visit (HOSPITAL_COMMUNITY): Payer: Medicare HMO | Admitting: Anesthesiology

## 2021-08-22 ENCOUNTER — Ambulatory Visit (HOSPITAL_COMMUNITY)
Admission: RE | Admit: 2021-08-22 | Discharge: 2021-08-22 | Disposition: A | Discharge status: Home / Self Care | Payer: Medicare HMO | Source: Ambulatory Visit | Attending: Orthopedic Surgery | Admitting: Orthopedic Surgery

## 2021-08-22 ENCOUNTER — Other Ambulatory Visit: Payer: Self-pay

## 2021-08-22 ENCOUNTER — Encounter (HOSPITAL_COMMUNITY)
Admission: RE | Disposition: A | Discharge status: Home / Self Care | Payer: Self-pay | Source: Ambulatory Visit | Attending: Orthopedic Surgery

## 2021-08-22 ENCOUNTER — Encounter (HOSPITAL_COMMUNITY): Payer: Self-pay | Admitting: Orthopedic Surgery

## 2021-08-22 DIAGNOSIS — M65331 Trigger finger, right middle finger: Secondary | ICD-10-CM | POA: Diagnosis not present

## 2021-08-22 DIAGNOSIS — G5601 Carpal tunnel syndrome, right upper limb: Secondary | ICD-10-CM | POA: Diagnosis not present

## 2021-08-22 DIAGNOSIS — I10 Essential (primary) hypertension: Secondary | ICD-10-CM | POA: Diagnosis not present

## 2021-08-22 DIAGNOSIS — F17210 Nicotine dependence, cigarettes, uncomplicated: Secondary | ICD-10-CM | POA: Diagnosis not present

## 2021-08-22 DIAGNOSIS — Z79899 Other long term (current) drug therapy: Secondary | ICD-10-CM | POA: Insufficient documentation

## 2021-08-22 DIAGNOSIS — J449 Chronic obstructive pulmonary disease, unspecified: Secondary | ICD-10-CM | POA: Insufficient documentation

## 2021-08-22 DIAGNOSIS — M65341 Trigger finger, right ring finger: Secondary | ICD-10-CM | POA: Diagnosis not present

## 2021-08-22 DIAGNOSIS — F17200 Nicotine dependence, unspecified, uncomplicated: Secondary | ICD-10-CM | POA: Diagnosis not present

## 2021-08-22 DIAGNOSIS — Z7984 Long term (current) use of oral hypoglycemic drugs: Secondary | ICD-10-CM | POA: Diagnosis not present

## 2021-08-22 DIAGNOSIS — E119 Type 2 diabetes mellitus without complications: Secondary | ICD-10-CM | POA: Insufficient documentation

## 2021-08-22 DIAGNOSIS — K219 Gastro-esophageal reflux disease without esophagitis: Secondary | ICD-10-CM | POA: Insufficient documentation

## 2021-08-22 DIAGNOSIS — F1290 Cannabis use, unspecified, uncomplicated: Secondary | ICD-10-CM | POA: Insufficient documentation

## 2021-08-22 HISTORY — PX: CARPAL TUNNEL RELEASE: SHX101

## 2021-08-22 HISTORY — PX: TRIGGER FINGER RELEASE: SHX641

## 2021-08-22 LAB — GLUCOSE, CAPILLARY: Glucose-Capillary: 114 mg/dL — ABNORMAL HIGH (ref 70–99)

## 2021-08-22 SURGERY — CARPAL TUNNEL RELEASE
Anesthesia: General | Site: Hand | Laterality: Right

## 2021-08-22 MED ORDER — ONDANSETRON HCL 4 MG/2ML IJ SOLN
INTRAMUSCULAR | Status: AC
  Filled 2021-08-22: qty 2

## 2021-08-22 MED ORDER — FENTANYL CITRATE (PF) 100 MCG/2ML IJ SOLN
INTRAMUSCULAR | Status: AC
  Filled 2021-08-22: qty 2

## 2021-08-22 MED ORDER — ONDANSETRON HCL 4 MG/2ML IJ SOLN
INTRAMUSCULAR | Status: DC | PRN
  Administered 2021-08-22: 4 mg via INTRAVENOUS

## 2021-08-22 MED ORDER — FENTANYL CITRATE (PF) 250 MCG/5ML IJ SOLN
INTRAMUSCULAR | Status: DC | PRN
  Administered 2021-08-22: 25 ug via INTRAVENOUS
  Administered 2021-08-22 (×3): 50 ug via INTRAVENOUS
  Administered 2021-08-22: 25 ug via INTRAVENOUS
  Administered 2021-08-22 (×2): 50 ug via INTRAVENOUS

## 2021-08-22 MED ORDER — CEFAZOLIN SODIUM-DEXTROSE 2-4 GM/100ML-% IV SOLN
2.0000 g | INTRAVENOUS | Status: AC
  Administered 2021-08-22: 2 g via INTRAVENOUS
  Filled 2021-08-22: qty 100

## 2021-08-22 MED ORDER — LIDOCAINE HCL (PF) 2 % IJ SOLN
INTRAMUSCULAR | Status: AC
  Filled 2021-08-22: qty 5

## 2021-08-22 MED ORDER — ONDANSETRON HCL 4 MG PO TABS: 4.0000 mg | ORAL_TABLET | Freq: Three times a day (TID) | ORAL | 0 refills | Status: AC | PRN

## 2021-08-22 MED ORDER — 0.9 % SODIUM CHLORIDE (POUR BTL) OPTIME
TOPICAL | Status: DC | PRN
  Administered 2021-08-22: 1000 mL

## 2021-08-22 MED ORDER — ORAL CARE MOUTH RINSE: 15.0000 mL | Freq: Once | OROMUCOSAL | Status: AC

## 2021-08-22 MED ORDER — PROPOFOL 10 MG/ML IV BOLUS
INTRAVENOUS | Status: AC
  Filled 2021-08-22: qty 20

## 2021-08-22 MED ORDER — EPHEDRINE 5 MG/ML INJ
INTRAVENOUS | Status: AC
  Filled 2021-08-22: qty 10

## 2021-08-22 MED ORDER — HYDROCODONE-ACETAMINOPHEN 5-325 MG PO TABS
1.0000 | ORAL_TABLET | Freq: Four times a day (QID) | ORAL | 0 refills | Status: AC | PRN
Start: 2021-08-22 — End: 2021-08-29

## 2021-08-22 MED ORDER — LIDOCAINE HCL (CARDIAC) PF 100 MG/5ML IV SOSY
PREFILLED_SYRINGE | INTRAVENOUS | Status: DC | PRN
  Administered 2021-08-22: 60 mg via INTRAVENOUS

## 2021-08-22 MED ORDER — KETOROLAC TROMETHAMINE 30 MG/ML IJ SOLN
INTRAMUSCULAR | Status: AC
  Filled 2021-08-22: qty 2

## 2021-08-22 MED ORDER — BUPIVACAINE HCL (PF) 0.5 % IJ SOLN
INTRAMUSCULAR | Status: DC | PRN
  Administered 2021-08-22: 10 mL

## 2021-08-22 MED ORDER — BUPIVACAINE HCL (PF) 0.5 % IJ SOLN
INTRAMUSCULAR | Status: AC
  Filled 2021-08-22: qty 30

## 2021-08-22 MED ORDER — LACTATED RINGERS IV SOLN: INTRAVENOUS | Status: DC

## 2021-08-22 MED ORDER — PROPOFOL 10 MG/ML IV BOLUS
INTRAVENOUS | Status: DC | PRN
  Administered 2021-08-22: 160 mg via INTRAVENOUS

## 2021-08-22 MED ORDER — CHLORHEXIDINE GLUCONATE 0.12 % MT SOLN
15.0000 mL | Freq: Once | OROMUCOSAL | Status: AC
  Administered 2021-08-22: 15 mL via OROMUCOSAL

## 2021-08-22 MED ORDER — FENTANYL CITRATE PF 50 MCG/ML IJ SOSY: 25.0000 ug | PREFILLED_SYRINGE | INTRAMUSCULAR | Status: DC | PRN

## 2021-08-22 MED ORDER — ONDANSETRON HCL 4 MG/2ML IJ SOLN: 4.0000 mg | Freq: Once | INTRAMUSCULAR | Status: DC | PRN

## 2021-08-22 SURGICAL SUPPLY — 47 items
ADH SKN CLS APL DERMABOND .7 (GAUZE/BANDAGES/DRESSINGS) ×2
APL PRP STRL LF DISP 70% ISPRP (MISCELLANEOUS) ×2
BANDAGE ESMARK 4X12 BL STRL LF (DISPOSABLE) ×2 IMPLANT
BLADE SURG 15 STRL LF DISP TIS (BLADE) ×2 IMPLANT
BLADE SURG 15 STRL SS (BLADE) ×3
BNDG CMPR 12X4 ELC STRL LF (DISPOSABLE) ×2
BNDG CMPR STD VLCR NS LF 5.8X2 (GAUZE/BANDAGES/DRESSINGS) ×2
BNDG CMPR STD VLCR NS LF 5.8X3 (GAUZE/BANDAGES/DRESSINGS) ×2
BNDG COHESIVE 3X5 TAN STRL LF (GAUZE/BANDAGES/DRESSINGS) ×3 IMPLANT
BNDG CONFORM 2 STRL LF (GAUZE/BANDAGES/DRESSINGS) ×3 IMPLANT
BNDG ELASTIC 2X5.8 VLCR NS LF (GAUZE/BANDAGES/DRESSINGS) ×3 IMPLANT
BNDG ELASTIC 3X5.8 VLCR NS LF (GAUZE/BANDAGES/DRESSINGS) ×3 IMPLANT
BNDG ELASTIC 3X5.8 VLCR STR LF (GAUZE/BANDAGES/DRESSINGS) ×3 IMPLANT
BNDG ESMARK 4X12 BLUE STRL LF (DISPOSABLE) ×3
BNDG GAUZE ELAST 4 BULKY (GAUZE/BANDAGES/DRESSINGS) IMPLANT
CHLORAPREP W/TINT 26 (MISCELLANEOUS) ×3 IMPLANT
CLOTH BEACON ORANGE TIMEOUT ST (SAFETY) ×3 IMPLANT
COVER LIGHT HANDLE STERIS (MISCELLANEOUS) ×6 IMPLANT
CUFF TOURN SGL QUICK 18X4 (TOURNIQUET CUFF) ×3 IMPLANT
DERMABOND ADVANCED (GAUZE/BANDAGES/DRESSINGS) ×1
DERMABOND ADVANCED .7 DNX12 (GAUZE/BANDAGES/DRESSINGS) ×2 IMPLANT
DRAPE HALF SHEET 40X57 (DRAPES) ×3 IMPLANT
DRAPE SURG 17X23 STRL (DRAPES) ×3 IMPLANT
GAUZE 4X4 16PLY ~~LOC~~+RFID DBL (SPONGE) ×3 IMPLANT
GAUZE KERLIX 2X3 DERM STRL LF (GAUZE/BANDAGES/DRESSINGS) ×3 IMPLANT
GAUZE SPONGE 4X4 12PLY STRL (GAUZE/BANDAGES/DRESSINGS) ×3 IMPLANT
GAUZE XEROFORM 1X8 LF (GAUZE/BANDAGES/DRESSINGS) ×3 IMPLANT
GLOVE SRG 8 PF TXTR STRL LF DI (GLOVE) ×2 IMPLANT
GLOVE SURG POLYISO LF SZ8 (GLOVE) ×9 IMPLANT
GLOVE SURG UNDER POLY LF SZ7 (GLOVE) ×6 IMPLANT
GLOVE SURG UNDER POLY LF SZ8 (GLOVE) ×3
GOWN STRL REUS W/ TWL XL LVL3 (GOWN DISPOSABLE) ×2 IMPLANT
GOWN STRL REUS W/TWL LRG LVL3 (GOWN DISPOSABLE) ×3 IMPLANT
GOWN STRL REUS W/TWL XL LVL3 (GOWN DISPOSABLE) ×3
KIT TURNOVER KIT A (KITS) ×3 IMPLANT
MANIFOLD NEPTUNE II (INSTRUMENTS) ×3 IMPLANT
NEEDLE HYPO 21X1.5 SAFETY (NEEDLE) ×3 IMPLANT
NS IRRIG 1000ML POUR BTL (IV SOLUTION) ×3 IMPLANT
PACK BASIC LIMB (CUSTOM PROCEDURE TRAY) ×3 IMPLANT
PAD ARMBOARD 7.5X6 YLW CONV (MISCELLANEOUS) ×3 IMPLANT
POSITIONER HAND ALUMI XLG (MISCELLANEOUS) ×3 IMPLANT
SET BASIN LINEN APH (SET/KITS/TRAYS/PACK) ×3 IMPLANT
SUT ETHILON 3 0 FSL (SUTURE) IMPLANT
SUT MON AB 3-0 SH 27 (SUTURE) IMPLANT
SUT PROLENE NAB BLUE 3-0 30IN (SUTURE) ×3 IMPLANT
SYR CONTROL 10ML LL (SYRINGE) ×3 IMPLANT
UNDERPAD 30X36 HEAVY ABSORB (UNDERPADS AND DIAPERS) ×3 IMPLANT

## 2021-08-22 NOTE — Anesthesia Procedure Notes (Signed)
Procedure Name: LMA Insertion Date/Time: 08/22/2021 9:04 AM Performed by: Karna Dupes, CRNA Pre-anesthesia Checklist: Patient identified, Emergency Drugs available, Suction available and Patient being monitored Patient Re-evaluated:Patient Re-evaluated prior to induction Oxygen Delivery Method: Circle system utilized Preoxygenation: Pre-oxygenation with 100% oxygen Induction Type: IV induction LMA: LMA inserted LMA Size: 4.0 Number of attempts: 1 Placement Confirmation: positive ETCO2 and breath sounds checked- equal and bilateral Tube secured with: Tape Dental Injury: Teeth and Oropharynx as per pre-operative assessment

## 2021-08-22 NOTE — Op Note (Signed)
Orthopaedic Surgery Operative Note (CSN: [id number])  Dylan Cline  [birth year] Date of Surgery: 08/22/2021   Diagnoses:  Right hand carpal tunnel syndrome and long and ring finger trigger fingers  Procedure: Right Open Carpal Tunnel Release A1 pulley release - right long finger A1 pulley releasr - right ring finger   Operative Finding Successful completion of the planned procedure.  Release of the transverse carpal ligament. Release of the A1 pulley to right long and ring fingers.    Post-Op Diagnosis: Same Surgeons:Primary: Mordecai Rasmussen, MD Assistants:  None Location: [facility] OR ROOM 2 Anesthesia: General with local anesthesia Antibiotics: Ancef 2 g Tourniquet time:  Total Tourniquet Time Documented: Upper Arm (laterality) - 45 minutes Total: Upper Arm (laterality) - 45 minutes  Estimated Blood Loss: Minimal Complications: None Specimens: None Implants: * No implants in log *  Indications for Surgery:   Dylan Cline is a 70 y.o. male with numbness, tingling and pain in the median nerve distribution to the right hand.  EMG confirmed severe right carpal tunnel syndrome.  He also had triggering of the right ring and the long fingers, which was causing discomfort.  Risks and benefits of operative and nonoperative management were discussed prior to surgery with the patient and informed consent form was completed.  Specific risks including infection, need for additional surgery, bleeding, damage to surrounding structures including nerves and blood vessels, damage to the median nerve, persistent symptoms, recurrence of symptoms and more severe complications associated with anesthesia.  He elected to proceed.  Surgical consent was completed.   Procedure:   The patient was identified properly. Informed consent was obtained and the surgical site was marked. The patient was taken to the OR where general anesthesia was induced.  The patient was positioned supine, on a hand table.  The  right arm was prepped and draped in the usual sterile fashion.  Timeout was performed before the beginning of the case.  Tourniquet was used for the above duration.  He received 2 g of Ancef prior to making incision.  We started by making an oblique incision centered over the A1 pulley to the right long and ring finger.  We incised sharply through skin.  We dissected bluntly to the surface of the A1 pulley on the long finger.  The radial and ulnar digital nerves were protected.  A1 pulley was then incised sharply with a scalpel.  This was continued along its full course.  We used dissecting scissors to ensure that the distal extent of the A1 pulley was released.  We then used a hemostat underneath the flexor tendons to ensure that they are completely released.  There is no catching visualized with flexion of the long finger.  We then turned our attention to the ring finger.  Once again, the radial and ulnar digital nerves were protected.  The A1 pulley was identified and cleaned off.  We then used a scalpel to incise the full course of the A1 pulley.  We used dissecting scissors to ensure that the A1 pulley was completely incised.  We then used a hemostat to lift the flexor tendons, and ensure that there were no more adhesions or remaining aspects of the A1 pulley.  The ring finger was completely flexed.  No catching was visualized with flexion of the finger.  At this point, we turned our attention to the open carpal tunnel release.  Incision was made in line with the radial border of the ring finger. The carpal tunnel transverse  fascia was identified, cleaned, and incised sharply. The common sensory branches were visualized along with the superficial palmar arch and protected.  The median nerve was protected below. Deep retractors were placed underneath the transverse carpal ligament, protecting the nerve. I released the ligament completely, and then released the proximal distal volar forearm fascia. The  nerve was identified, and visualized, and protected throughout the case. No masses or abnormalities were identified in ulnar bursa.  The wounds were irrigated copiously, and the wounds injected, and the skin closed with absorbable sutures followed by a volar splint and sterile gauze. Patient  tolerated this well, with no complications.  We irrigated the wound copiously.  We closed the incision with interrupted, horizontal mattress sutures.  A sterile dressing was placed.  The patient was awoken and taken to PACU in stable condition.  Post-operative plan:  The patient will be discharged home from the PACU. WBAT on the operative extremity; limit lifting to nothing more than a coffee cup until follow up appointment   DVT prophylaxis not indicated in this ambulatory upper extremity patient without significant risk factors.    Pain control with PRN pain medication preferring oral medicines.   Follow up plan will be scheduled in approximately 10-14 days for incision check

## 2021-08-22 NOTE — Interval H&P Note (Signed)
History and Physical Interval Note:  08/22/2021 8:55 AM  Dylan Cline  has presented today for surgery, with the diagnosis of Right hand carpal tunnel syndrome and long and ring finger trigger fingers.  The various methods of treatment have been discussed with the patient and family. After consideration of risks, benefits and other options for treatment, the patient has consented to  Procedure(s) with comments: CARPAL TUNNEL RELEASE (Right) RELEASE TRIGGER FINGER/A-1 PULLEY (Right) - Right long and ring finger trigger release as a surgical intervention.  The patient's history has been reviewed, patient examined, no change in status, stable for surgery.  I have reviewed the patient's chart and labs.  Questions were answered to the patient's satisfaction.     Mordecai Rasmussen

## 2021-08-22 NOTE — Anesthesia Preprocedure Evaluation (Signed)
Anesthesia Evaluation  Patient identified by MRN, date of birth, ID band Patient awake    Reviewed: Allergy & Precautions, NPO status , Patient's Chart, lab work & pertinent test results  History of Anesthesia Complications (+) history of anesthetic complications  Airway Mallampati: II  TM Distance: >3 FB Neck ROM: Full    Dental  (+) Dental Advisory Given, Missing   Pulmonary shortness of breath and with exertion, COPD,  COPD inhaler, Current Smoker and Patient abstained from smoking.,    Pulmonary exam normal breath sounds clear to auscultation       Cardiovascular Exercise Tolerance: Good hypertension, Pt. on medications Normal cardiovascular exam Rhythm:Regular Rate:Normal     Neuro/Psych negative neurological ROS  negative psych ROS   GI/Hepatic GERD  Medicated,(+)     substance abuse  alcohol use and marijuana use,   Endo/Other  diabetes, Well Controlled, Type 2, Oral Hypoglycemic Agents  Renal/GU Renal InsufficiencyRenal disease (creatinine 2.24)     Musculoskeletal negative musculoskeletal ROS (+)   Abdominal   Peds  Hematology negative hematology ROS (+)   Anesthesia Other Findings Was on prednisone.  Reproductive/Obstetrics negative OB ROS                            Anesthesia Physical Anesthesia Plan  ASA: 3  Anesthesia Plan: General   Post-op Pain Management: Minimal or no pain anticipated   Induction: Intravenous  PONV Risk Score and Plan: 2 and Ondansetron  Airway Management Planned: LMA  Additional Equipment:   Intra-op Plan:   Post-operative Plan: Extubation in OR  Informed Consent: I have reviewed the patients History and Physical, chart, labs and discussed the procedure including the risks, benefits and alternatives for the proposed anesthesia with the patient or authorized representative who has indicated his/her understanding and acceptance.      Dental advisory given  Plan Discussed with: CRNA and Surgeon  Anesthesia Plan Comments:         Anesthesia Quick Evaluation

## 2021-08-22 NOTE — Transfer of Care (Signed)
Immediate Anesthesia Transfer of Care Note  Patient: Dylan Cline  Procedure(s) Performed: CARPAL TUNNEL RELEASE (Right: Hand) RELEASE TRIGGER FINGER/A-1 PULLEY (Right: Finger)  Patient Location: PACU  Anesthesia Type:General  Level of Consciousness: drowsy  Airway & Oxygen Therapy: Patient Spontanous Breathing and Patient connected to nasal cannula oxygen  Post-op Assessment: Report given to RN and Post -op Vital signs reviewed and stable  Post vital signs: Reviewed and stable  Last Vitals:  Vitals Value Taken Time  BP 133/73   Temp    Pulse 78   Resp 19   SpO2 98%     Last Pain:  Vitals:   08/22/21 0752  TempSrc: Oral  PainSc: 0-No pain         Complications: No notable events documented.

## 2021-08-22 NOTE — Anesthesia Postprocedure Evaluation (Signed)
Anesthesia Post Note  Patient: Dylan Cline  Procedure(s) Performed: CARPAL TUNNEL RELEASE (Right: Hand) RELEASE TRIGGER FINGER/A-1 PULLEY (Right: Finger)  Patient location during evaluation: PACU Anesthesia Type: General Level of consciousness: awake and alert and oriented Pain management: pain level controlled Vital Signs Assessment: post-procedure vital signs reviewed and stable Respiratory status: spontaneous breathing, nonlabored ventilation and respiratory function stable Cardiovascular status: blood pressure returned to baseline and stable Postop Assessment: no apparent nausea or vomiting Anesthetic complications: no   No notable events documented.   Last Vitals:  Vitals:   08/22/21 1030 08/22/21 1049  BP: (!) 146/79 (!) 166/82  Pulse: 68 65  Resp: 13 18  Temp:  36.5 C  SpO2: 95% 95%    Last Pain:  Vitals:   08/22/21 1049  TempSrc: Oral  PainSc: 0-No pain                 Dylan Cline

## 2021-08-22 NOTE — Discharge Instructions (Addendum)
  Dylan Cline A. Amedeo Kinsman, MD [facility] [address] [place],  [place]  [address] Phone: [phone] Fax: [phone]    [place] may remove your bandage on postop day 3 and get the hand wet.  Do not scrub the incisions.  Ok to let soap and water run over the incisions.  Pat the incisions dry. No ointments or lotions to be applied to the incision.  Do not submerge the incision for 1 month.  MEDICATIONS Hydrocodone - to be taken as needed.  This medication contains Tylenol/acetaminophen.  Do not take any additional Tylenol/Acetaminophen while using this medication Zofran - take for nausea as needed  Elevate your hand as much as possible to help with swelling and pain control   FOLLOW-UP If you develop a Fever (>101.5), Redness or Drainage from the surgical incision site, please call our office to arrange for an evaluation. Please call the office to schedule a follow-up appointment for your incision check if you do not already have one, 7-10 days post-operatively.  IF YOU HAVE ANY QUESTIONS, PLEASE FEEL FREE TO CALL OUR OFFICE.  HELPFUL INFORMATION  You should wean off your narcotic medicines as soon as you are able.  Most patients will be off or using minimal narcotics before their first postop appointment.   You may be more comfortable sleeping in a semi-seated position the first few nights following surgery.  Keep a pillow propped under the elbow and forearm for comfort.  If you have a recliner type of chair it might be beneficial.    We suggest you use the pain medication the first night prior to going to bed, in order to ease any pain when the anesthesia wears off. You should avoid taking pain medications on an empty stomach as it will make you nauseous.  Do not drink alcoholic beverages or take illicit drugs when taking pain medications.  You may return to work/school in the next couple of days when you feel up to  it. Desk work and typing is fine.  Pain medication may make you constipated.  Below are a few solutions to try in this order: Decrease the amount of pain medication if you aren't having pain. Drink lots of decaffeinated fluids. Drink prune juice and/or each dried prunes  If the first 3 don't work start with additional solutions Take Colace - an over-the-counter stool softener Take Senokot - an over-the-counter laxative Take Miralax - a stronger over-the-counter laxative

## 2021-08-23 ENCOUNTER — Encounter (HOSPITAL_COMMUNITY): Payer: Self-pay | Admitting: Orthopedic Surgery

## 2021-08-30 ENCOUNTER — Other Ambulatory Visit: Payer: Self-pay

## 2021-08-30 ENCOUNTER — Encounter: Payer: Self-pay | Admitting: Orthopedic Surgery

## 2021-08-30 ENCOUNTER — Ambulatory Visit (INDEPENDENT_AMBULATORY_CARE_PROVIDER_SITE_OTHER): Payer: Medicare HMO | Admitting: Orthopedic Surgery

## 2021-08-30 VITALS — Ht 69.0 in | Wt 164.0 lb

## 2021-08-30 DIAGNOSIS — M65331 Trigger finger, right middle finger: Secondary | ICD-10-CM

## 2021-08-30 DIAGNOSIS — G5601 Carpal tunnel syndrome, right upper limb: Secondary | ICD-10-CM

## 2021-08-30 DIAGNOSIS — M65341 Trigger finger, right ring finger: Secondary | ICD-10-CM

## 2021-08-30 MED ORDER — HYDROCODONE-ACETAMINOPHEN 5-325 MG PO TABS: 1.0000 | ORAL_TABLET | Freq: Four times a day (QID) | ORAL | 0 refills | Status: DC | PRN

## 2021-08-30 NOTE — Progress Notes (Signed)
Orthopaedic Postop Note  Assessment: Dylan Cline is a 70 y.o. male s/p right open carpal tunnel release, right ring and long finger trigger release  DOS: 08/22/21  Plan: Sutures were removed in clinic today.  Incisions were covered with Dermabond. Patient was reminded that it can take up to a full year for the median nerve to fully recover, given the severity of his symptoms as demonstrated by the EMG findings. He does report improvements in the radiating pains, tingling sensations but he does have residual numbness in the median nerve distribution. He does have some pain and stiffness in the index finger and thumb. Will provide him with an additional prescription for Norco, and this will be the last prescription.  Meds ordered this encounter  Medications   HYDROcodone-acetaminophen (NORCO/VICODIN) 5-325 MG tablet    Sig: Take 1 tablet by mouth every 6 (six) hours as needed for moderate pain.    Dispense:  20 tablet    Refill:  0     Follow-up: Return in about 4 weeks (around 09/27/2021). XR at next visit: None  Subjective:  Chief Complaint  Patient presents with   Routine Post Op    Rt hand CTR DOS 08/22/21    History of Present Illness: Dylan Cline is a 71 y.o. male who presents following the above stated procedure.  He is doing well overall.  He has been taking his pain medications.  He needs a new prescription.  He states the radiating pain and tingling sensations in his hand have improved.  He still has some numbness in the index, long and ring fingers.  He also has some pain and stiffness of his thumb and index finger.  Review of Systems: No fevers or chills No numbness or tingling No Chest Pain No shortness of breath   Objective: Ht 5\' 9"  (1.753 m)   Wt 164 lb (74.4 kg)   BMI 24.22 kg/m   Physical Exam:  And oriented.  No acute distress.  Surgical incisions in the right volar hand are healing well.  No surrounding erythema or drainage.  He does have  some scabbing of the trigger finger incision.  Minimal tenderness to palpation of the incisions.  He is able to make a full fist.  Decreased sensation in the median nerve distribution.  IMAGING: I personally ordered and reviewed the following images:  No new imaging obtained today.  Mordecai Rasmussen, MD 08/30/2021 9:25 AM

## 2021-09-06 ENCOUNTER — Encounter: Payer: Medicare HMO | Admitting: Orthopedic Surgery

## 2021-09-12 IMAGING — CT CT ABD-PELV W/O CM
2 of 4 series · 16 of 46 positions shown, 18 images · non-contrast
Comparison: CT abdomen pelvis dated 04/29/2019.

CLINICAL DATA: 69-year-old male with abdominal pain, nausea
vomiting.

EXAM:
CT ABDOMEN AND PELVIS WITHOUT CONTRAST
TECHNIQUE: Multidetector CT imaging of the abdomen and pelvis was performed
following the standard protocol without IV contrast.

[Series 2: axial st · axial · 0.71mm/px · z∈[+1088,+1498]mm · 13 of 96 slices shown, 15 images]
[im 7/96  soft-tissue]
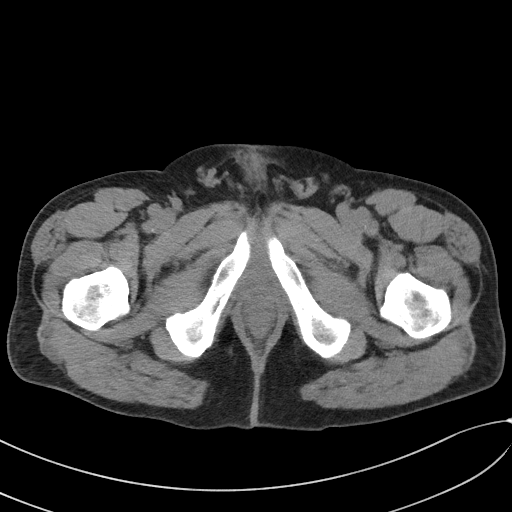
[im 7/96  bone]
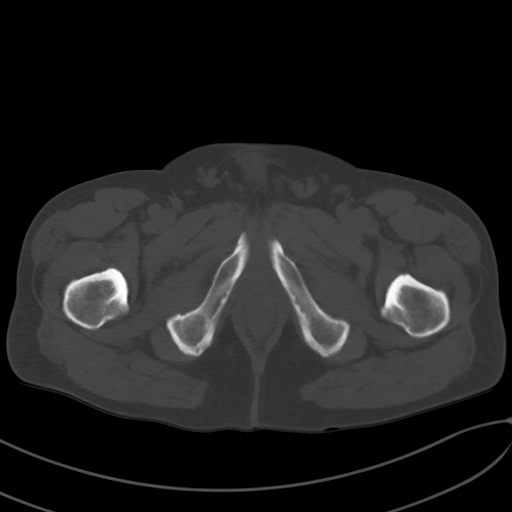
[im 13/96  soft-tissue]
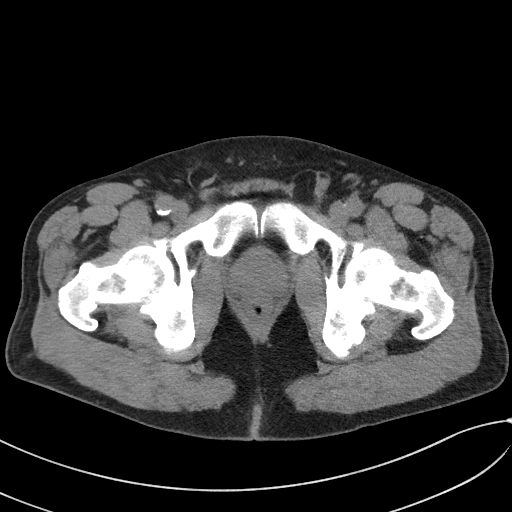
[im 20/96  soft-tissue]
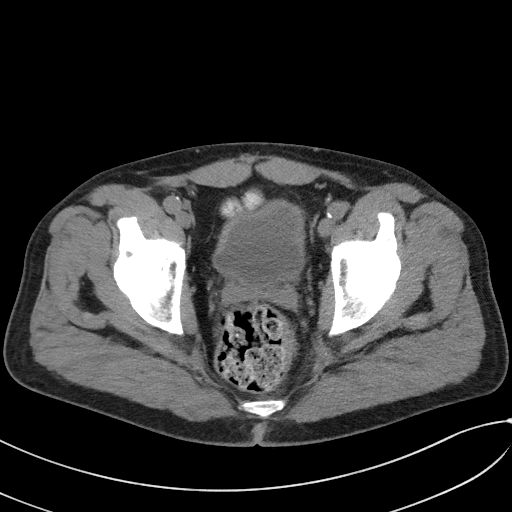
[im 26/96  soft-tissue]
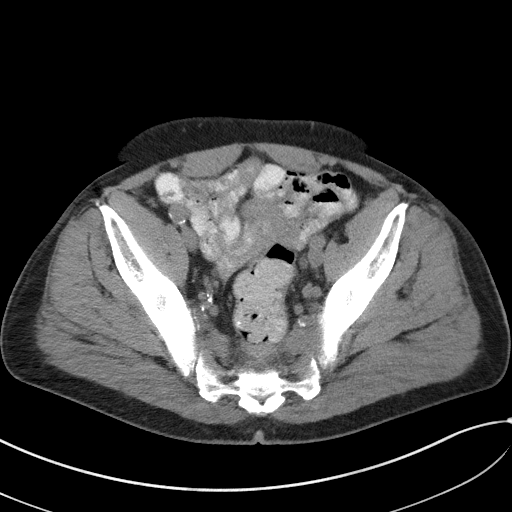
[im 32/96  soft-tissue]
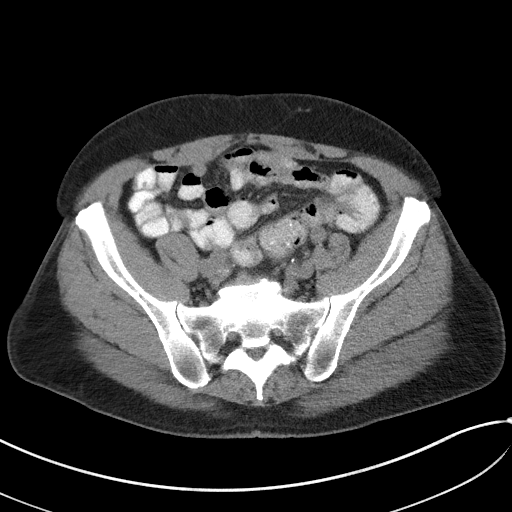
[im 39/96  soft-tissue]
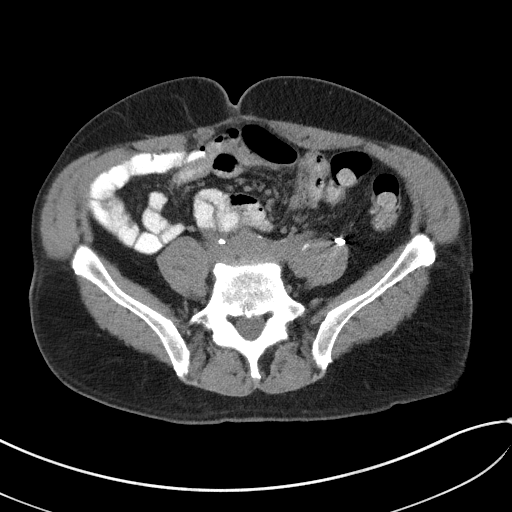
[im 51/96  soft-tissue]
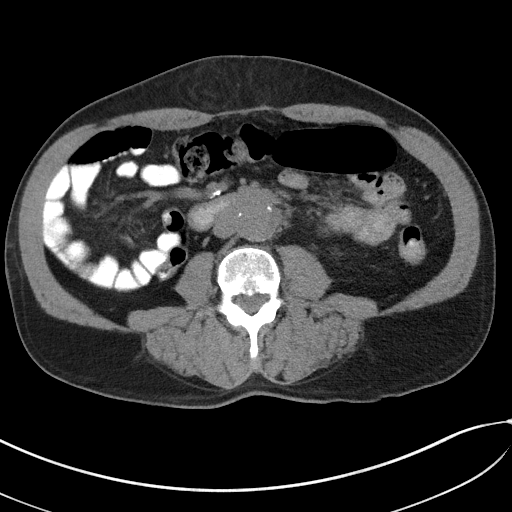
[im 58/96  soft-tissue]
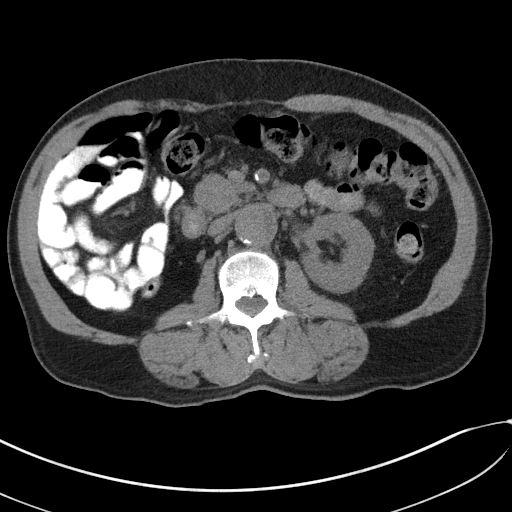
[im 64/96  soft-tissue]
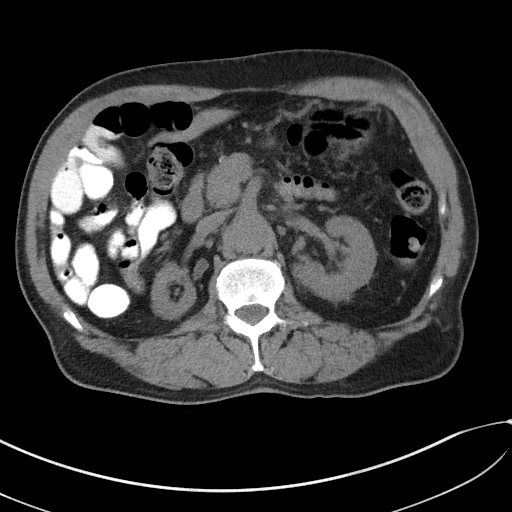
[im 64/96  bone]
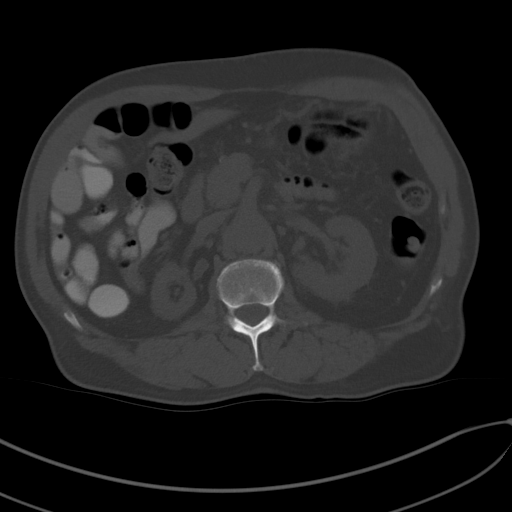
[im 70/96  soft-tissue]
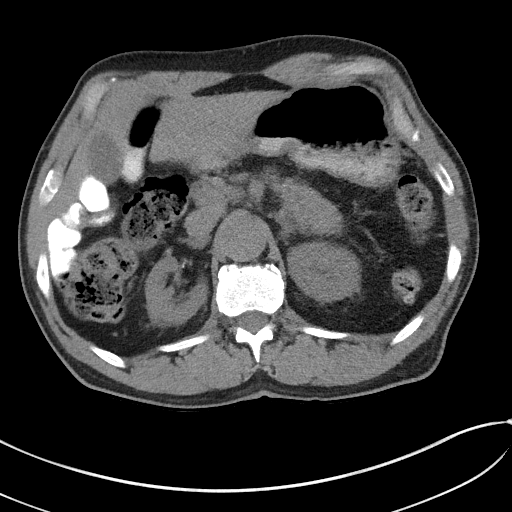
[im 77/96  soft-tissue]
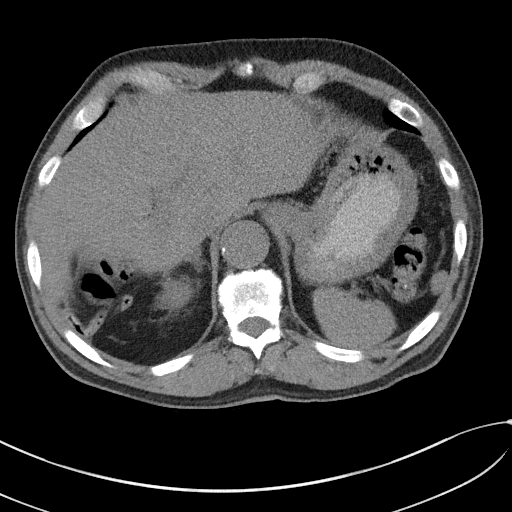
[im 83/96  soft-tissue]
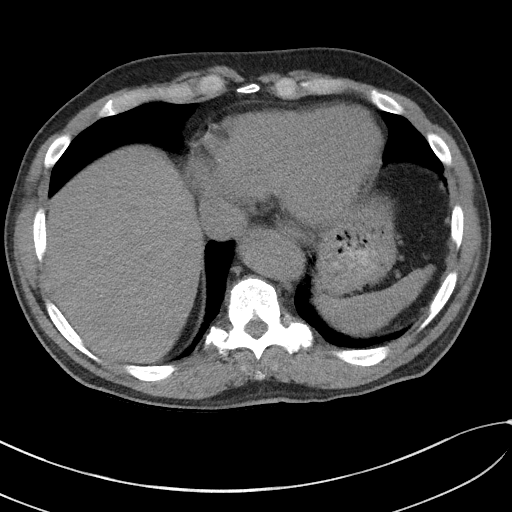
[im 89/96  soft-tissue]
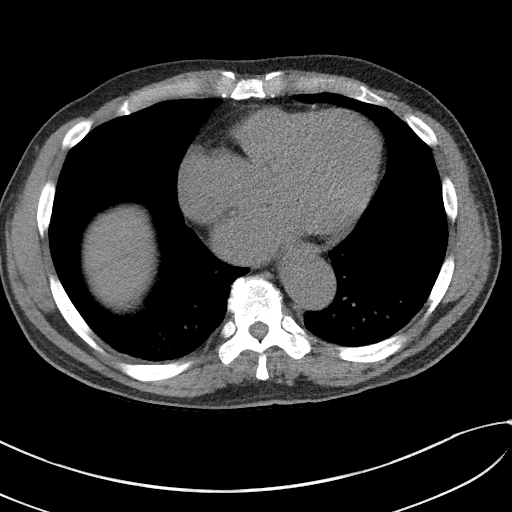

[Series 5: coronal st · coronal · 0.89mm/px · 3 of 143 slices shown]
[im 48/143  soft-tissue]
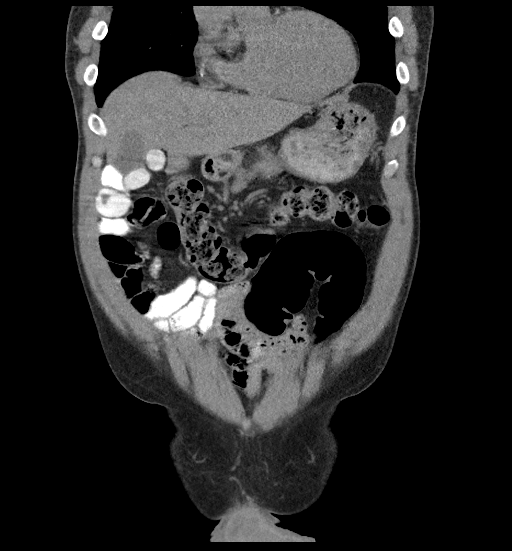
[im 64/143  soft-tissue]
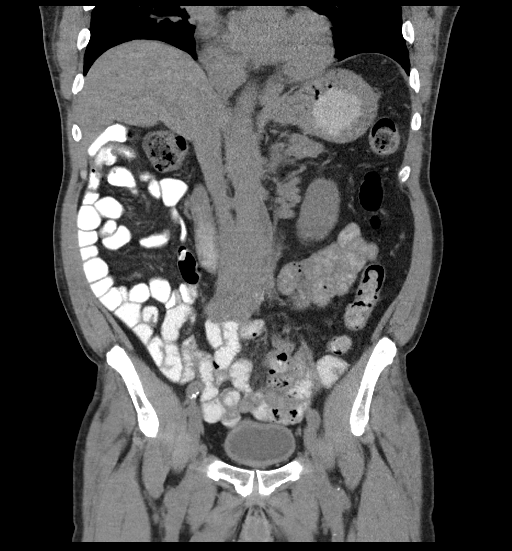
[im 79/143  soft-tissue]
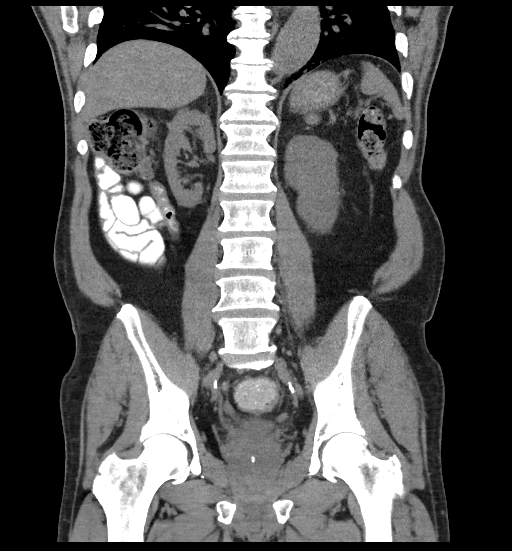

[16 of 46 positions shown; findings below may reference images not displayed]

FINDINGS: Evaluation of this exam is limited in the absence of intravenous
contrast.

Lower chest: Minimal bibasilar dependent atelectasis. The visualized
lung bases are otherwise clear. There is coronary vascular
calcification.

No intra-abdominal free air or free fluid.

Hepatobiliary: The liver is unremarkable. No intrahepatic biliary
dilatation. The gallbladder is unremarkable.

Pancreas: Several punctate pancreatic calcification, likely sequela
of chronic pancreatitis. No dilatation of the main pancreatic duct.

Spleen: Normal in size without focal abnormality.

Adrenals/Urinary Tract: The adrenal glands unremarkable. Moderate
right renal parenchyma atrophy. No hydronephrosis or
nephrolithiasis. The left kidney is unremarkable. The visualized
ureters and urinary bladder appear unremarkable.

Stomach/Bowel: There is moderate stool throughout the colon. There
are scattered colonic diverticula without active inflammatory
changes. There is no bowel obstruction. The appendix is normal.

Vascular/Lymphatic: Moderate aortoiliac atherosclerotic disease. And
small dilatation of the distal aorta measuring up to 3.1 cm.
Evaluation of the aorta is limited on this noncontrast CT. The IVC
is unremarkable. No portal venous gas. There is no adenopathy.

Reproductive: The prostate and seminal vesicles are grossly
unremarkable.

Other: Several small fat containing supraumbilical hernia similar or
slightly increased in size compared to prior CT. No fluid
collection. No bowel herniation.

Musculoskeletal: Degenerative changes of the spine. No acute osseous
pathology.
IMPRESSION: 1. No acute intra-abdominal or pelvic pathology.
2. Moderate colonic stool burden. No bowel obstruction. Normal
appendix.
3. Colonic diverticulosis.
4. Fat containing supraumbilical hernias as seen on the prior CT. No
herniated bowel. No fluid collection.
5. A 3.1 cm infrarenal abdominal aortic aneurysm. Recommend
follow-up ultrasound every 3 years. This recommendation follows ACR
consensus guidelines: White Paper of the ACR Incidental Findings
Committee II on Vascular Findings. [HOSPITAL] 1245;
6. Aortic Atherosclerosis ([id number]-FET.T).

## 2021-09-14 ENCOUNTER — Telehealth: Payer: Self-pay | Admitting: Orthopedic Surgery

## 2021-09-14 NOTE — Telephone Encounter (Signed)
Patient called to request to be seen sooner than 09/27/21, to be evaluated for release back to work. (Note: patient had not previously asked for an out of work note)  - states he works at an [employer], and "just [employer], and backs up vehicles; said nothing strenuous. Okay to see him next week/next available?

## 2021-09-14 NOTE — Telephone Encounter (Signed)
Patient's wife has just informed us that patient actually did return to work, actually on 09/06/21. I still scheduled for next available clinic day. Aware of appointment. Please advise if he should be given a note based on this updated information - ?light duty?

## 2021-09-15 ENCOUNTER — Encounter: Payer: Self-pay | Admitting: *Deleted

## 2021-09-15 ENCOUNTER — Encounter: Payer: Self-pay | Admitting: Cardiology

## 2021-09-15 ENCOUNTER — Encounter: Payer: Self-pay | Admitting: Internal Medicine

## 2021-09-15 ENCOUNTER — Encounter: Payer: Self-pay | Admitting: Orthopedic Surgery

## 2021-09-15 ENCOUNTER — Ambulatory Visit: Payer: Medicare HMO | Admitting: Cardiology

## 2021-09-15 VITALS — BP 166/88 | HR 88 | Ht 69.0 in | Wt 168.0 lb

## 2021-09-15 DIAGNOSIS — I10 Essential (primary) hypertension: Secondary | ICD-10-CM | POA: Diagnosis not present

## 2021-09-15 DIAGNOSIS — I351 Nonrheumatic aortic (valve) insufficiency: Secondary | ICD-10-CM | POA: Diagnosis not present

## 2021-09-15 DIAGNOSIS — R002 Palpitations: Secondary | ICD-10-CM | POA: Diagnosis not present

## 2021-09-15 NOTE — Patient Instructions (Signed)
Medication Instructions:  Continue all current medications.  Labwork: none  Testing/Procedures: Your physician has requested that you have an echocardiogram. Echocardiography is a painless test that uses sound waves to create images of your heart. It provides your doctor with information about the size and shape of your heart and how well your hearts chambers and valves are working. This procedure takes approximately one hour. There are no restrictions for this procedure. Your physician has recommended that you wear  14 day event monitor. Event monitors are medical devices that record the hearts electrical activity. Doctors most often [place] these monitors to diagnose arrhythmias. Arrhythmias are problems with the speed or rhythm of the heartbeat. The monitor is a small, portable device. You can wear one while you do your normal daily activities. This is usually used to diagnose what is causing palpitations/syncope (passing out). Office will contact with results via phone or letter.     Follow-Up: Pending test results   Any Other Special Instructions Will Be Listed Below (If Applicable).   If you need a refill on your cardiac medications before your next appointment, please call your pharmacy.

## 2021-09-15 NOTE — Telephone Encounter (Signed)
Patient aware of note being issued for return to work*  / *Picked up today.

## 2021-09-15 NOTE — Telephone Encounter (Signed)
Spoke w/ pt wife who says pt just needs a return to work letter starting 09/06/21. Pt hasn't been having any concerns so no restrictions are needed. Let pt wife know if there are any concerns before his appointment to please give [place] a call.

## 2021-09-15 NOTE — Progress Notes (Signed)
Clinical Summary Mr. Flegel is a 70 y.o.male seen today as a new patient, last seen in our office in 2017  1. Heart murmur -03/2016 echo LVEF 55-60%, grade I dd, mild AI, trivial MR,    2. Palpitations - can feel heart racing at times - occurs 1-2 times per month. Can last a few hours at time. +lightheaded, dizzy,SOB - no specific trigger      3. HTN - has not taken meds last few days.    Past Medical History:  Diagnosis Date   Acute type 2 diabetes mellitus with manifestations ([place])    no meds currently   CRI (chronic renal insufficiency), stage 3 (moderate) ([facility])    GERD (gastroesophageal reflux disease)    Hypertension    Mass of left parotid gland    Pancreatitis    Pancreatitis, alcoholic, acute 23/01/3613   Pancreatitis, alcoholic, acute 43/10/5398   Smoker      No Known Allergies   Current Outpatient Medications  Medication Sig Dispense Refill   amLODipine (NORVASC) 5 MG tablet Take 5 mg by mouth daily.     atorvastatin (LIPITOR) 40 MG tablet Take 40 mg by mouth daily.     Cholecalciferol (VITAMIN D) 125 MCG (5000 UT) CAPS Take 5,000 Units by mouth daily.     diclofenac Sodium (VOLTAREN) 1 % GEL Apply 2 g topically daily as needed (arthritis pain).     HYDROcodone-acetaminophen (NORCO/VICODIN) 5-325 MG tablet Take 1 tablet by mouth every 6 (six) hours as needed for moderate pain. 20 tablet 0   hydroxypropyl methylcellulose / hypromellose (ISOPTO TEARS / GONIOVISC) 2.5 % ophthalmic solution Place 1 drop into both eyes as needed for dry eyes.     JANUVIA 50 MG tablet Take 50 mg by mouth daily.     LEVEMIR FLEXTOUCH 100 UNIT/ML Pen Inject 25 Units into the skin at bedtime.      Multiple Vitamin (MULTIVITAMIN WITH MINERALS) TABS tablet Take 1 tablet by mouth daily.     omeprazole (PRILOSEC) 40 MG capsule Take 1 capsule (40 mg total) by mouth 2 (two) times daily. (Patient taking differently: Take 40 mg by mouth daily.) 180 capsule 3   tamsulosin (FLOMAX) 0.4 MG  CAPS capsule Take 0.4 mg by mouth at bedtime.     Tiotropium Bromide Monohydrate (SPIRIVA RESPIMAT) 2.5 MCG/ACT AERS Inhale 2 puffs into the lungs daily. 4 g 5   vitamin B-12 (CYANOCOBALAMIN) 1000 MCG tablet Take 1,000 mcg by mouth daily.     No current facility-administered medications for this visit.     Past Surgical History:  Procedure Laterality Date   CARPAL TUNNEL RELEASE Right 08/22/2021   Procedure: CARPAL TUNNEL RELEASE;  Surgeon: Mordecai Rasmussen, MD;  Location: [facility];  Service: Orthopedics;  Laterality: Right;   CATARACT EXTRACTION W/PHACO Right 11/24/2019   Procedure: CATARACT EXTRACTION PHACO AND INTRAOCULAR LENS PLACEMENT (IOC);  Surgeon: [name], MD;  Location: [facility];  Service: Ophthalmology;  Laterality: Right;  CDE: 4.96   CATARACT EXTRACTION W/PHACO Left 07/26/2020   Procedure: CATARACT EXTRACTION PHACO AND INTRAOCULAR LENS PLACEMENT (IOC);  Surgeon: [name], MD;  Location: [facility];  Service: Ophthalmology;  Laterality: Left;  CDE: 6.94   COLONOSCOPY   01/27/2005   QQP:YPPJKDTO hemorrhoids, otherwise normal rectum/Normal colon, marginal prep made the exam more difficult   ESOPHAGOGASTRODUODENOSCOPY   10/12/2004   IZT:IWPYKD esophagus/Focal area of submucosal petechial hemorrhage in the fundus (likely a trauma-induced phenomenon secondary to vomiting), otherwise normal  LAPAROSCOPIC GASTROTOMY W/ REPAIR OF ULCER     PAROTIDECTOMY Left 07/29/2019   Procedure: LEFT PAROTIDECTOMY;  Surgeon: Leta Baptist, MD;  Location: [place];  Service: ENT;  Laterality: Left;   TRIGGER FINGER RELEASE Right 08/22/2021   Procedure: RELEASE TRIGGER FINGER/A-1 PULLEY;  Surgeon: Mordecai Rasmussen, MD;  Location: [facility];  Service: Orthopedics;  Laterality: Right;  Right long and ring finger trigger release     No Known Allergies    Family History  Problem Relation Age of Onset   Diabetes Mother    Diabetes Father    Heart attack Maternal Grandfather    Cancer  Paternal Grandmother    Heart attack Maternal Uncle    Cancer Maternal Aunt    Cancer Maternal Uncle    Colon cancer Neg Hx    Pancreatic cancer Neg Hx    Stomach cancer Neg Hx      Social History Mr. No reports that he has been smoking cigarettes. He started smoking about 50 years ago. He has a 40.00 pack-year smoking history. He has never used smokeless tobacco. Mr. Wey reports current alcohol use.   Review of Systems CONSTITUTIONAL: No weight loss, fever, chills, weakness or fatigue.  HEENT: Eyes: No visual loss, blurred vision, double vision or yellow sclerae.No hearing loss, sneezing, congestion, runny nose or sore throat.  SKIN: No rash or itching.  CARDIOVASCULAR: per hpi RESPIRATORY: No shortness of breath, cough or sputum.  GASTROINTESTINAL: No anorexia, nausea, vomiting or diarrhea. No abdominal pain or blood.  GENITOURINARY: No burning on urination, no polyuria NEUROLOGICAL: No headache, dizziness, syncope, paralysis, ataxia, numbness or tingling in the extremities. No change in bowel or bladder control.  MUSCULOSKELETAL: No muscle, back pain, joint pain or stiffness.  LYMPHATICS: No enlarged nodes. No history of splenectomy.  PSYCHIATRIC: No history of depression or anxiety.  ENDOCRINOLOGIC: No reports of sweating, cold or heat intolerance. No polyuria or polydipsia.  Marland Kitchen   Physical Examination Today's Vitals   09/15/21 1448  BP: (!) 166/88  Pulse: 88  SpO2: 98%  Weight: 168 lb (76.2 kg)  Height: 5\' 9"  (1.753 m)   Body mass index is 24.81 kg/m.  Gen: resting comfortably, no acute distress HEENT: no scleral icterus, pupils equal round and reactive, no palptable cervical adenopathy,  CV: RRR, no m/rg no jvd Resp: Clear to auscultation bilaterally GI: abdomen is soft, non-tender, non-distended, normal bowel sounds, no hepatosplenomegaly MSK: extremities are warm, no edema.  Skin: warm, no rash Neuro:  no focal deficits Psych: appropriate  affect   Diagnostic Studies  03/2016 echo Study Conclusions   - Left ventricle: The cavity size was normal. Wall thickness was    increased in a pattern of mild LVH. Systolic function was normal.    The estimated ejection fraction was in the range of 55% to 60%.    Wall motion was normal; there were no regional wall motion    abnormalities. Doppler parameters are consistent with abnormal    left ventricular relaxation (grade 1 diastolic dysfunction).  - Aortic valve: Mildly calcified annulus. Trileaflet; mildly    calcified leaflets. There was mild regurgitation.  - Mitral valve: Calcified annulus. Mildly thickened leaflets .    There was trivial regurgitation.  - Right atrium: The atrium was mildly dilated. Central venous    pressure (est): 3 mm Hg.  - Tricuspid valve: There was trivial regurgitation.  - Pulmonary arteries: Systolic pressure could not be accurately    estimated.  - Pericardium,  extracardiac: There was no pericardial effusion.    Assessment and Plan  1. Palpitations - will plan for 14 day zio patch to further evaluate  2. Aortic regurgitation - mild by 2017 echo, since  5 years since last study would repeat for surveillance   3. HTN - has not taken meds last few days, encouraged compliance.      Arnoldo Lenis, M.D.

## 2021-09-19 ENCOUNTER — Ambulatory Visit (INDEPENDENT_AMBULATORY_CARE_PROVIDER_SITE_OTHER): Payer: Medicare HMO

## 2021-09-19 ENCOUNTER — Telehealth: Payer: Self-pay | Admitting: Cardiology

## 2021-09-19 ENCOUNTER — Other Ambulatory Visit: Payer: Self-pay | Admitting: Cardiology

## 2021-09-19 DIAGNOSIS — R002 Palpitations: Secondary | ICD-10-CM

## 2021-09-19 NOTE — Telephone Encounter (Signed)
PERCERT:   Long Term Monitor

## 2021-09-20 ENCOUNTER — Encounter: Payer: Medicare HMO | Admitting: Orthopedic Surgery

## 2021-09-21 ENCOUNTER — Other Ambulatory Visit: Payer: Self-pay

## 2021-09-21 ENCOUNTER — Ambulatory Visit (HOSPITAL_COMMUNITY)
Admission: RE | Admit: 2021-09-21 | Discharge: 2021-09-21 | Disposition: A | Discharge status: Home / Self Care | Payer: Medicare HMO | Source: Ambulatory Visit | Attending: Cardiology | Admitting: Cardiology

## 2021-09-21 DIAGNOSIS — I351 Nonrheumatic aortic (valve) insufficiency: Secondary | ICD-10-CM | POA: Diagnosis not present

## 2021-09-21 DIAGNOSIS — R002 Palpitations: Secondary | ICD-10-CM

## 2021-09-21 LAB — ECHOCARDIOGRAM COMPLETE
AR max vel: 1.67 cm2
AV Area VTI: 1.84 cm2
AV Area mean vel: 1.58 cm2
AV Mean grad: 8 mmHg
AV Peak grad: 15.1 mmHg
Ao pk vel: 1.94 m/s
Area-P 1/2: 3.27 cm2
Calc EF: 57.3 %
MV VTI: 1.5 cm2
P 1/2 time: 343 msec
S' Lateral: 4 cm
Single Plane A2C EF: 59.9 %
Single Plane A4C EF: 54.6 %

## 2021-09-21 NOTE — Progress Notes (Incomplete)
*  PRELIMINARY RESULTS* Echocardiogram 2D Echocardiogram has been performed.  Dylan Cline 09/21/2021, 2:11 PM

## 2021-09-23 ENCOUNTER — Telehealth: Payer: Self-pay | Admitting: *Deleted

## 2021-09-23 NOTE — Telephone Encounter (Signed)
Laurine Blazer, LPN  46/27/0350 09:38 PM EST Back to Top    Notified wife Thayer Headings), copy to pcp.

## 2021-09-23 NOTE — Telephone Encounter (Signed)
-----   Message from Arnoldo Lenis, MD sent at 09/22/2021  4:27 PM EST ----- Echo shows heart function remains normal. AOrtic valve has a mild to moderate leak to it, nothing of concerng just something to continue to monitor at this time  Zandra Abts MD

## 2021-09-27 ENCOUNTER — Encounter: Payer: Medicare HMO | Admitting: Orthopedic Surgery

## 2021-09-27 ENCOUNTER — Encounter: Payer: Self-pay | Admitting: Orthopedic Surgery

## 2021-09-28 ENCOUNTER — Encounter: Payer: Self-pay | Admitting: Orthopedic Surgery

## 2021-09-28 ENCOUNTER — Ambulatory Visit (INDEPENDENT_AMBULATORY_CARE_PROVIDER_SITE_OTHER): Payer: Medicare HMO | Admitting: Orthopedic Surgery

## 2021-09-28 ENCOUNTER — Other Ambulatory Visit: Payer: Self-pay

## 2021-09-28 DIAGNOSIS — M65331 Trigger finger, right middle finger: Secondary | ICD-10-CM

## 2021-09-28 DIAGNOSIS — E782 Mixed hyperlipidemia: Secondary | ICD-10-CM | POA: Diagnosis not present

## 2021-09-28 DIAGNOSIS — N401 Enlarged prostate with lower urinary tract symptoms: Secondary | ICD-10-CM | POA: Diagnosis not present

## 2021-09-28 DIAGNOSIS — G5601 Carpal tunnel syndrome, right upper limb: Secondary | ICD-10-CM

## 2021-09-28 DIAGNOSIS — E1122 Type 2 diabetes mellitus with diabetic chronic kidney disease: Secondary | ICD-10-CM | POA: Diagnosis not present

## 2021-09-28 DIAGNOSIS — M65341 Trigger finger, right ring finger: Secondary | ICD-10-CM

## 2021-09-28 NOTE — Progress Notes (Signed)
Orthopaedic Postop Note  Assessment: Dylan Cline is a 70 y.o. male s/p right open carpal tunnel release, right ring and long finger trigger release  DOS: 08/22/21  Plan: Continues to have some numbness and tingling in the median nerve distribution to the right hand.  However, the symptoms have improved.  He states his sensation has improved.  He continues to have some stiffness and pain in his right thumb, and wrist, consistent with arthritis.  He no longer has any triggering of the long and ring fingers.  Small amount of callus and scab formation remains over the incision to the trigger finger incision.  Allow this to heal up.  Continue to work on range of motion of the right hand.  Okay to return to work.  Follow-up as needed.  Once again, I stressed that it can take several months for the median nerve to fully recover, given the severity of his symptoms prior to surgery.  He stated his understanding.   Follow-up: Return if symptoms worsen or fail to improve. XR at next visit: None  Subjective:  Chief Complaint  Patient presents with   Routine Post Op    DOS 08/22/21 S/p CTR RT    History of Present Illness: Dylan Cline is a 70 y.o. male who presents following the above stated procedure.  He has returned to work.  He has some persistent numbness and tingling in the right hand, but this is improved since surgery.  No longer has radiating pains.  He has decreased sensation, which is better than it was prior to surgery.  No triggering of his long and ring fingers.  No issues with his surgical incisions.  He is taking Tylenol, which is helping with some of his symptoms.   Review of Systems: No fevers or chills No numbness or tingling No Chest Pain No shortness of breath   Objective: There were no vitals taken for this visit.  Physical Exam:  Alert and oriented.  No acute distress.  Carpal tunnel incision is healing very well.  No surrounding erythema or drainage.   Volar incision to the trigger fingers of the long and ring fingers is healing.  There is some residual callus and scabbing.  This is not tender to palpation.  No surrounding erythema or drainage.  Unable to make a full fist.  Negative Tinel's at the carpal tunnel.  Decreased sensation to the thumb, index long and the radial ring finger.  Intact sensation to the ulnar ring finger.  IMAGING: I personally ordered and reviewed the following images:  No new imaging obtained today.  Mordecai Rasmussen, MD 09/28/2021 9:36 AM

## 2021-09-28 NOTE — Patient Instructions (Signed)
Ok to return to work, please provide a [employer]

## 2021-10-11 ENCOUNTER — Other Ambulatory Visit: Payer: Self-pay

## 2021-10-11 ENCOUNTER — Telehealth: Payer: Self-pay | Admitting: *Deleted

## 2021-10-11 ENCOUNTER — Encounter: Payer: Self-pay | Admitting: Internal Medicine

## 2021-10-11 ENCOUNTER — Telehealth: Payer: Self-pay | Admitting: Orthopedic Surgery

## 2021-10-11 ENCOUNTER — Ambulatory Visit (INDEPENDENT_AMBULATORY_CARE_PROVIDER_SITE_OTHER): Payer: No Typology Code available for payment source | Admitting: Internal Medicine

## 2021-10-11 VITALS — BP 168/85 | HR 76 | Temp 97.1°F | Ht 69.0 in | Wt 166.2 lb

## 2021-10-11 DIAGNOSIS — R634 Abnormal weight loss: Secondary | ICD-10-CM

## 2021-10-11 DIAGNOSIS — R1010 Upper abdominal pain, unspecified: Secondary | ICD-10-CM | POA: Diagnosis not present

## 2021-10-11 DIAGNOSIS — K219 Gastro-esophageal reflux disease without esophagitis: Secondary | ICD-10-CM

## 2021-10-11 DIAGNOSIS — F1010 Alcohol abuse, uncomplicated: Secondary | ICD-10-CM

## 2021-10-11 DIAGNOSIS — R002 Palpitations: Secondary | ICD-10-CM | POA: Diagnosis not present

## 2021-10-11 MED ORDER — PANTOPRAZOLE SODIUM 40 MG PO TBEC
40.0000 mg | DELAYED_RELEASE_TABLET | Freq: Every day | ORAL | 11 refills | Status: DC
Start: 2021-10-11 — End: 2022-12-19

## 2021-10-11 MED ORDER — METOPROLOL TARTRATE 25 MG PO TABS: 25.0000 mg | ORAL_TABLET | Freq: Two times a day (BID) | ORAL | 1 refills | Status: DC

## 2021-10-11 NOTE — Telephone Encounter (Signed)
Patient's wife Marcie Bal informed and verbalized understanding of plan.

## 2021-10-11 NOTE — Telephone Encounter (Signed)
Spoke with pt wife who states he's still having swelling and pain but it's in both hands. He is alternating the tylenol and ibuprofen with little to no relief.

## 2021-10-11 NOTE — Telephone Encounter (Signed)
Patient's wife Thayer Headings, designated contact, called to relay that patient is having some pain and swelling of hand that he has had surgery done on 08/22/21. I offered appointment for this week. Rather than patient  scheduling at this time, he asked if he may have medication ordered for the pain; if so, uses [facility], [place].

## 2021-10-11 NOTE — Patient Instructions (Signed)
It was good to see you today!  As discussed, you need to resume medication for acid reflux.  Begin Protonix 40 mg once daily take each morning before breakfast (dispense 30 with 11 refills)  GERD information provided  I highly recommend you cut back or ideally stop consuming alcohol entirely  I have offered you a screening colonoscopy-ASA 3/propofol. Decrease Levemir to 15 units the night before your colonoscopy.   If you continue to have upper abdominal discomfort and reflux symptoms, we will consider performing an EGD at the same time as colonoscopy as discussed.  Further recommendations to follow.  Keep your appointment to see Dr. [place] Crane.

## 2021-10-11 NOTE — Telephone Encounter (Signed)
-----   Message from Arnoldo Lenis, MD sent at 10/11/2021  3:10 PM EST ----- Heart monitor shows some occasional abnormal heart rhythms. None are dangerous but can cause the symptoms he has had of palpitations. Can he start lopressor 25mg  bid to help decrease the abnormal heart rhythms.F/u 2 months but keep [place] updated on palpitations prior to f/u if ongoing.   Zandra Abts MD

## 2021-10-11 NOTE — Progress Notes (Signed)
Primary Care Physician:  Celene Squibb, MD Primary Gastroenterologist:  Dr. Gala Romney  Pre-Procedure History & Physical: HPI:  Dylan Cline is a 71 y.o. male here for follow-up of GERD and upper abdominal bloating discomfort.  Describes upper abdominal burning from time to time.  Typical frequent reflux symptoms.  Ran out of his omeprazole sometime ago and has had worsening of symptoms.  In fact, he has run out of most of his medications and has not sought out refills.  His blood pressure is up today.  He denies melena or rectal bleeding ; occasional constipation but bowel function otherwise doing well.  He is down 7 pounds since he was seen here a year and a half ago. Denies dysphagia. Continues to drink 2 pints of wine daily.  He works at [employer] down the street.  Denies NSAIDs.  Does not use illicit drug use.  History of complicated alcohol induced pancreatitis manifest as pseudocyst requiring drainage at [facility] several years ago.  Recently had carpal tunnel release surgery.    Past Medical History:  Diagnosis Date   Acute type 2 diabetes mellitus with manifestations ([place])    no meds currently   CRI (chronic renal insufficiency), stage 3 (moderate) (HCC)    GERD (gastroesophageal reflux disease)    Hypertension    Mass of left parotid gland    Pancreatitis    Pancreatitis, alcoholic, acute 71/11/4578   Pancreatitis, alcoholic, acute 99/05/3381   Smoker     Past Surgical History:  Procedure Laterality Date   CARPAL TUNNEL RELEASE Right 08/22/2021   Procedure: CARPAL TUNNEL RELEASE;  Surgeon: Mordecai Rasmussen, MD;  Location: [facility];  Service: Orthopedics;  Laterality: Right;   CATARACT EXTRACTION W/PHACO Right 11/24/2019   Procedure: CATARACT EXTRACTION PHACO AND INTRAOCULAR LENS PLACEMENT (IOC);  Surgeon: [name], MD;  Location: [facility];  Service: Ophthalmology;  Laterality: Right;  CDE: 4.96   CATARACT EXTRACTION W/PHACO Left 07/26/2020   Procedure: CATARACT  EXTRACTION PHACO AND INTRAOCULAR LENS PLACEMENT (IOC);  Surgeon: [name], MD;  Location: [facility];  Service: Ophthalmology;  Laterality: Left;  CDE: 6.94   COLONOSCOPY   01/27/2005   NKN:[id number] hemorrhoids, otherwise normal rectum/Normal colon, marginal prep made the exam more difficult   ESOPHAGOGASTRODUODENOSCOPY   10/12/2004   PFX:TKWIOX esophagus/Focal area of submucosal petechial hemorrhage in the fundus (likely a trauma-induced phenomenon secondary to vomiting), otherwise normal   LAPAROSCOPIC GASTROTOMY W/ REPAIR OF ULCER     PAROTIDECTOMY Left 07/29/2019   Procedure: LEFT PAROTIDECTOMY;  Surgeon: Leta Baptist, MD;  Location: [facility];  Service: ENT;  Laterality: Left;   TRIGGER FINGER RELEASE Right 08/22/2021   Procedure: RELEASE TRIGGER FINGER/A-1 PULLEY;  Surgeon: Mordecai Rasmussen, MD;  Location: [facility];  Service: Orthopedics;  Laterality: Right;  Right long and ring finger trigger release    Prior to Admission medications   Medication Sig Start Date End Date Taking? Authorizing Provider  atorvastatin (LIPITOR) 40 MG tablet Take 40 mg by mouth daily.   Yes [provider]  Cholecalciferol (VITAMIN D) 125 MCG (5000 UT) CAPS Take 5,000 Units by mouth daily.   Yes [provider]  diclofenac Sodium (VOLTAREN) 1 % GEL Apply 2 g topically daily as needed (arthritis pain).   Yes [provider]  hydroxypropyl methylcellulose / hypromellose (ISOPTO TEARS / GONIOVISC) 2.5 % ophthalmic solution Place 1 drop into both eyes as needed for dry eyes.   Yes [provider]  LEVEMIR FLEXTOUCH 100 UNIT/ML Pen Inject 25 Units into the skin at bedtime.  11/03/19  Yes [provider]  Multiple Vitamin (MULTIVITAMIN WITH MINERALS) TABS tablet Take 1 tablet by mouth daily.   Yes [provider]  Tiotropium Bromide Monohydrate (SPIRIVA RESPIMAT) 2.5 MCG/ACT AERS Inhale 2 puffs into the lungs daily. 07/04/21  Yes Margaretha Seeds, MD   vitamin B-12 (CYANOCOBALAMIN) 1000 MCG tablet Take 1,000 mcg by mouth daily.   Yes [provider]    Allergies as of 10/11/2021   (No Known Allergies)    Family History  Problem Relation Age of Onset   Diabetes Mother    Diabetes Father    Heart attack Maternal Grandfather    Cancer Paternal Grandmother    Heart attack Maternal Uncle    Cancer Maternal Aunt    Cancer Maternal Uncle    Colon cancer Neg Hx    Pancreatic cancer Neg Hx    Stomach cancer Neg Hx     Social History   Socioeconomic History   Marital status: Married    Spouse name: Not on file   Number of children: Not on file   Years of education: Not on file   Highest education level: Not on file  Occupational History   Not on file  Tobacco Use   Smoking status: Every Day    Packs/day: 1.00    Years: 40.00    Pack years: 40.00    Types: Cigarettes    Start date: 12/01/1970   Smokeless tobacco: Never  Vaping Use   Vaping Use: Never used  Substance and Sexual Activity   Alcohol use: Yes    Comment: 10/11/21 drinks a bottle of wine a day   Drug use: Yes    Types: Marijuana    Comment: occasionally to stimulate appetite   Sexual activity: Not on file  Other Topics Concern   Not on file  Social History Narrative   Not on file   Social Determinants of Health   Financial Resource Strain: Not on file  Food Insecurity: Not on file  Transportation Needs: Not on file  Physical Activity: Not on file  Stress: Not on file  Social Connections: Not on file  Intimate Partner Violence: Not on file    Review of Systems: See HPI, otherwise negative ROS  Physical Exam: BP (!) 168/85    Pulse 76    Temp (!) 97.1 F (36.2 C) (Temporal)    Ht 5\' 9"  (1.753 m)    Wt 166 lb 3.2 oz (75.4 kg)    BMI 24.54 kg/m  General:   Alert,  pleasant and cooperative in NAD Neck:  Supple; no masses or thyromegaly. No significant cervical adenopathy. Lungs:  Clear throughout to auscultation.   No wheezes, crackles, or  rhonchi. No acute distress. Heart:  Regular rate and rhythm; no murmurs, clicks, rubs,  or gallops. Abdomen: Non-distended, normal bowel sounds.  Soft and nontender without appreciable mass or hepatosplenomegaly.  Pulses:  Normal pulses noted. Extremities:  Without clubbing or edema.  Impression/Plan: 71 year old gentleman with alcohol use disorder and a history of pancreatitis/GERD presents with worsening of typical reflux symptoms;  some vague nonspecific upper abdominal discomfort with no alarm features. I suspect he has worsening of reflux as the primary issue.  Otherwise, symptoms fall in the nebulous realm of dyspepsia.  He does have a history of complicated pancreatitis.  7 pound weight loss in the last year and a half.  Noncompliant with his medication  regimen  He is overdue for a screening colonoscopy.  Recommendations:  Admonished patient to stop drinking alcohol  -  all forms  Begin Protonix 40 mg daily prescription provided  I have offered the patient a screening colonoscopy.  ASA 3/propofol.The risks, benefits, limitations, alternatives and imponderables have been reviewed with the patient. Questions have been answered. All parties are agreeable.    This upper abdominal symptoms have not markedly improved by the time he gets to his colonoscopy, would consider an EGD at that time.  At the present, would not pursue cross-sectional imaging.  However, plans could change in the future depending on clinical course.  Further recommendations to follow.   Notice: This dictation was prepared with Dragon dictation along with smaller phrase technology. Any transcriptional errors that result from this process are unintentional and may not be corrected upon review.

## 2021-10-12 ENCOUNTER — Telehealth: Payer: Self-pay | Admitting: Cardiology

## 2021-10-12 DIAGNOSIS — R634 Abnormal weight loss: Secondary | ICD-10-CM | POA: Diagnosis not present

## 2021-10-12 DIAGNOSIS — I10 Essential (primary) hypertension: Secondary | ICD-10-CM | POA: Diagnosis not present

## 2021-10-12 DIAGNOSIS — K219 Gastro-esophageal reflux disease without esophagitis: Secondary | ICD-10-CM | POA: Diagnosis not present

## 2021-10-12 DIAGNOSIS — D631 Anemia in chronic kidney disease: Secondary | ICD-10-CM | POA: Diagnosis not present

## 2021-10-12 DIAGNOSIS — R000 Tachycardia, unspecified: Secondary | ICD-10-CM | POA: Diagnosis not present

## 2021-10-12 DIAGNOSIS — R2231 Localized swelling, mass and lump, right upper limb: Secondary | ICD-10-CM | POA: Diagnosis not present

## 2021-10-12 DIAGNOSIS — E1122 Type 2 diabetes mellitus with diabetic chronic kidney disease: Secondary | ICD-10-CM | POA: Diagnosis not present

## 2021-10-12 DIAGNOSIS — N401 Enlarged prostate with lower urinary tract symptoms: Secondary | ICD-10-CM | POA: Diagnosis not present

## 2021-10-12 DIAGNOSIS — N184 Chronic kidney disease, stage 4 (severe): Secondary | ICD-10-CM | POA: Diagnosis not present

## 2021-10-12 DIAGNOSIS — R008 Other abnormalities of heart beat: Secondary | ICD-10-CM | POA: Diagnosis not present

## 2021-10-12 DIAGNOSIS — H269 Unspecified cataract: Secondary | ICD-10-CM | POA: Diagnosis not present

## 2021-10-12 DIAGNOSIS — E782 Mixed hyperlipidemia: Secondary | ICD-10-CM | POA: Diagnosis not present

## 2021-10-12 NOTE — Telephone Encounter (Signed)
Spoke with Raquel Sarna from [place] she reports 46 runs of SVT and 9 runs of VT. Monitor has been downloaded reviewed my MD.

## 2021-10-12 NOTE — Telephone Encounter (Signed)
Calling in with an abnormal zpatch results

## 2021-10-12 NOTE — Telephone Encounter (Signed)
Patient opted to come in;appointment scheduled.

## 2021-10-13 ENCOUNTER — Telehealth: Payer: Self-pay | Admitting: Internal Medicine

## 2021-10-13 ENCOUNTER — Telehealth: Payer: Self-pay

## 2021-10-13 NOTE — Telephone Encounter (Signed)
Tried to call pt to schedule TCS/-/+EGD w/Propofol ASA 3, left message with wife for him to call office.

## 2021-10-13 NOTE — Telephone Encounter (Signed)
See other phone note for today. 

## 2021-10-13 NOTE — Telephone Encounter (Signed)
Pt's wife called back. She requested to wait until February for procedure. Advised her Dr. Gala Romney will not be available in February but we will call to schedule procedure when his next schedule is available. Verbalized understanding.

## 2021-10-13 NOTE — Telephone Encounter (Signed)
Pt's wife was returning a call. [phone]

## 2021-10-14 ENCOUNTER — Ambulatory Visit (INDEPENDENT_AMBULATORY_CARE_PROVIDER_SITE_OTHER): Payer: No Typology Code available for payment source | Admitting: Orthopedic Surgery

## 2021-10-14 ENCOUNTER — Ambulatory Visit: Payer: No Typology Code available for payment source

## 2021-10-14 ENCOUNTER — Encounter: Payer: Self-pay | Admitting: Orthopedic Surgery

## 2021-10-14 ENCOUNTER — Other Ambulatory Visit: Payer: Self-pay

## 2021-10-14 DIAGNOSIS — G5601 Carpal tunnel syndrome, right upper limb: Secondary | ICD-10-CM

## 2021-10-14 DIAGNOSIS — M65331 Trigger finger, right middle finger: Secondary | ICD-10-CM | POA: Diagnosis not present

## 2021-10-14 DIAGNOSIS — M79642 Pain in left hand: Secondary | ICD-10-CM

## 2021-10-14 DIAGNOSIS — M65341 Trigger finger, right ring finger: Secondary | ICD-10-CM

## 2021-10-14 DIAGNOSIS — M1812 Unilateral primary osteoarthritis of first carpometacarpal joint, left hand: Secondary | ICD-10-CM | POA: Diagnosis not present

## 2021-10-14 DIAGNOSIS — M62838 Other muscle spasm: Secondary | ICD-10-CM

## 2021-10-14 DIAGNOSIS — M25532 Pain in left wrist: Secondary | ICD-10-CM

## 2021-10-14 MED ORDER — CYCLOBENZAPRINE HCL 10 MG PO TABS: 10.0000 mg | ORAL_TABLET | Freq: Two times a day (BID) | ORAL | 0 refills | Status: AC | PRN

## 2021-10-14 NOTE — Progress Notes (Signed)
Orthopaedic Postop Note  Assessment: BREAKER SPRINGER is a 71 y.o. male s/p right open carpal tunnel release, right ring and long finger trigger release; DOS: 08/22/21 Right wrist arthritis and hand swelling Left wrist pain and arthritis Left shoulder and neck muscle spasm pain  Plan: Sharp pain and radiating pains associated with carpal tunnel syndrome have improved.  Continues to have persistent numbness in the median nerve distribution.  No triggering of the long and ring fingers.  He has diffuse swelling about the right hand, which she states has been there since surgery.  Prior to surgery, he was complaining of similar type pains.  On x-ray, he does have arthritis of multiple joints in his fingers, as well as his wrist.  Radiographs obtained in clinic today of the left hand demonstrates wrist arthritis as well.  This is most painful for him, and I have offered him an injection.  He elected to proceed.  He is also complaining of spasming type pain in the left trapezius, and paraspinal muscles.  I provided him with Flexeril for these spasms.  Follow-up as needed.  Procedure note injection - Left Wrist joint   Verbal consent was obtained to inject the Left Wrist joint  Timeout was completed to confirm the site of injection.  The skin was prepped with alcohol and ethyl chloride was sprayed at the injection site.  A 21-gauge needle was used to inject 40 mg of Depomedrol and 1% lidocaine (1 cc) into the Left wrist using a dorsal approach.  There were no complications. A sterile bandage was applied.    Follow-up: Return if symptoms worsen or fail to improve. XR at next visit: None  Subjective:  Chief Complaint  Patient presents with   Hand Pain    RT w/ swelling since surgery/ sp CTR 08/22/21 LT hand turning red. Patient also complains of neck pain    History of Present Illness: REVIN CORKER is a 71 y.o. male who presents following the above stated procedure.  Sharp, radiating pains  in his right hand have improved.  He continues to have persistent numbness within the median nerve distribution.  No triggering of his long and ring fingers.  He continues to have swelling of his right hand.  No issues with the surgical incisions.  He also has pain in the left wrist.  Pains in his hand are limiting his ability to use his hands.  He has difficulty with some hygiene as a result.  He is also having some spasming pain in his left shoulder and left side of his neck.  This is associated with some radiating pains into his hand.  No medications at this time.  He states he is taking Tylenol.  He cannot take ibuprofen.   Review of Systems: No fevers or chills + numbness & tingling No Chest Pain No shortness of breath   Objective: There were no vitals taken for this visit.  Physical Exam:  Alert and oriented.  No acute distress.   Surgical incisions to the volar aspect of the right hand and wrist are healing well.  No surrounding erythema or drainage.  No tenderness to palpation.  Mild diffuse swelling about the wrist and hand.  Restricted range of motion of the right wrist.  Tenderness at the wrist joint.  Negative CMC grind test.  Decreased sensation in median nerve distribution.  Evaluation of the left wrist demonstrates no swelling.  He has tenderness to palpation at the wrist.  Slightly restricted range of  motion of the right wrist.  Sensation is intact.  Positive CMC grind test.  IMAGING: I personally ordered and reviewed the following images:  X-rays of the left hand were obtained in clinic today.  No acute injuries are noted.  Diffuse degenerative changes throughout the fingers.  Severe first CMC degenerative changes, with complete loss of joint space.  Mild degenerative changes noted of the wrist.  Impression: Left hand x-rays with severe CMC arthritis, and diffuse degenerative changes in the fingers.  Mordecai Rasmussen, MD 10/14/2021 11:21 PM

## 2021-10-14 NOTE — Patient Instructions (Signed)
Ok to return to work, please provide a Surveyor, minerals Following Joint Injections  In clinic today, you received an injection in one of your joints (sometimes more than one).  Occasionally, you can have some pain at the injection site, this is normal.  You can place ice at the injection site, or take over-the-counter medications such as Tylenol (acetaminophen) or Advil (ibuprofen).  Please follow all directions listed on the bottle.  If your joint (knee or shoulder) becomes swollen, red or very painful, please contact the clinic for additional assistance.   Two medications were injected, including lidocaine and a steroid (often referred to as cortisone).  Lidocaine is effective almost immediately but wears off quickly.  However, the steroid can take a few days to improve your symptoms.  In some cases, it can make your pain worse for a couple of days.  Do not be concerned if this happens as it is common.  You can apply ice or take some over-the-counter medications as needed.

## 2021-10-19 ENCOUNTER — Ambulatory Visit: Payer: Medicare HMO | Admitting: Gastroenterology

## 2021-10-19 NOTE — Telephone Encounter (Signed)
Called spouse, LMOVM

## 2021-10-20 NOTE — Telephone Encounter (Signed)
LMOVM to call back for spouse. Letter mailed.

## 2021-10-24 NOTE — Telephone Encounter (Signed)
Spoke to pt's wife, pt would like to wait until Dr. Gala Romney returns to have procedure.

## 2021-11-10 DIAGNOSIS — E1122 Type 2 diabetes mellitus with diabetic chronic kidney disease: Secondary | ICD-10-CM | POA: Diagnosis not present

## 2021-11-10 DIAGNOSIS — R2231 Localized swelling, mass and lump, right upper limb: Secondary | ICD-10-CM | POA: Diagnosis not present

## 2021-11-10 DIAGNOSIS — M25532 Pain in left wrist: Secondary | ICD-10-CM | POA: Diagnosis not present

## 2021-11-16 ENCOUNTER — Ambulatory Visit: Payer: Medicare HMO | Admitting: Gastroenterology

## 2021-11-29 NOTE — Telephone Encounter (Signed)
Tried to call pt, spoke to his wife. Offered to schedule TCS/-/+EGD 12/22/21 or 12/29/21. She will need to talk to pt and call back.

## 2021-12-20 ENCOUNTER — Other Ambulatory Visit: Payer: Self-pay

## 2021-12-20 ENCOUNTER — Encounter: Payer: Self-pay | Admitting: Orthopedic Surgery

## 2021-12-20 ENCOUNTER — Ambulatory Visit (INDEPENDENT_AMBULATORY_CARE_PROVIDER_SITE_OTHER): Payer: No Typology Code available for payment source | Admitting: Orthopedic Surgery

## 2021-12-20 VITALS — Ht 69.0 in | Wt 166.0 lb

## 2021-12-20 DIAGNOSIS — M25532 Pain in left wrist: Secondary | ICD-10-CM | POA: Diagnosis not present

## 2021-12-20 DIAGNOSIS — M7989 Other specified soft tissue disorders: Secondary | ICD-10-CM

## 2021-12-20 MED ORDER — PREDNISONE 10 MG (21) PO TBPK: ORAL_TABLET | ORAL | 0 refills | Status: DC

## 2021-12-20 NOTE — Progress Notes (Signed)
Orthopaedic Postop Note ? ?Assessment: ?Dylan Cline is a 71 y.o. male s/p right open carpal tunnel release, right ring and long finger trigger release; DOS: 08/22/21 ? ?Dorsal left hand edema; no recent injury ? ?Plan: ?Most pressing complaint at this time is diffuse swelling of the left hand.  No recent injury.  On physical exam, he has edema over the dorsal aspect of his hand.  No warmth.  No fluctuance appreciated.  Prior left wrist injection did not provide any relief of pain or associated symptoms.  He has not tried compression on his hand, this could be helpful.  I am not concerned about an infection at this time.  He does have arthritis in the fingers, as well as his wrist, so this could be contributing.  Otherwise, I am not sure what is causing the swelling.  He states he has taken prednisone in the past, and this has been helpful.  I provided him with an updated prescription for prednisone today.  I plan to see him back in approximately 1 month.  If he continues to have issues with swelling, I plan to have Dr. Aline Brochure available to review with me. ? ? ? ?Follow-up: ?Return in about 29 days (around 01/18/2022). ?XR at next visit: None ? ?Subjective: ? ?Chief Complaint  ?Patient presents with  ? Hand Problem  ?  Swelling and pain in the left hand/wrist since Nov '21  ? ? ?History of Present Illness: ?Dylan Cline is a 71 y.o. male who presents following the above stated procedure.  Preoperative symptoms have improved.  He continues to have pain, swelling in the left hand.  He states the swelling in the left hand has improved compared to the last couple of days.  He states he had to remove his watch, as well as a ring on his left hand due to the swelling.  He has been wearing a compressive sleeve on his left elbow, which is not tight, and this is only been for short period of time.  No fevers or chills.  No recent injuries.  Previous left wrist injection did not improve any of his symptoms. ? ? ?Review of  Systems: ?No fevers or chills ?+ numbness & tingling ?No Chest Pain ?No shortness of breath ? ? ?Objective: ?Ht '5\' 9"'$  (1.753 m)   Wt 166 lb (75.3 kg)   BMI 24.51 kg/m?  ? ?Physical Exam: ? ?Alert and oriented.  No acute distress. ? ?Left hand with pitting edema over the dorsal aspect.  No fluctuance.  No redness.  No warmth is appreciated.  He has difficulty making a full fist.  Tenderness to palpation within the left wrist.  2+ radial pulse.  Fingers are warm and well-perfused. ? ?IMAGING: ?I personally ordered and reviewed the following images: ? ?No new imaging obtained today. ? ?Mordecai Rasmussen, MD ?12/20/2021 ?11:08 PM ? ? ?

## 2021-12-23 ENCOUNTER — Ambulatory Visit (INDEPENDENT_AMBULATORY_CARE_PROVIDER_SITE_OTHER): Payer: No Typology Code available for payment source | Admitting: Cardiology

## 2021-12-23 ENCOUNTER — Encounter: Payer: Self-pay | Admitting: Cardiology

## 2021-12-23 VITALS — BP 154/82 | HR 82 | Ht 69.0 in | Wt 168.6 lb

## 2021-12-23 DIAGNOSIS — I10 Essential (primary) hypertension: Secondary | ICD-10-CM

## 2021-12-23 DIAGNOSIS — I471 Supraventricular tachycardia: Secondary | ICD-10-CM | POA: Diagnosis not present

## 2021-12-23 DIAGNOSIS — R002 Palpitations: Secondary | ICD-10-CM

## 2021-12-23 MED ORDER — AMLODIPINE BESYLATE 5 MG PO TABS: 5.0000 mg | ORAL_TABLET | Freq: Every day | ORAL | 3 refills | Status: DC

## 2021-12-23 NOTE — Patient Instructions (Signed)
Medication Instructions:  ?Resume Norvasc '5mg'$  daily  ?Check at home for the Lopressor - notify office if have at home. ?Continue all other medications.    ? ?Labwork: ?none ? ?Testing/Procedures: ?none ? ?Follow-Up: ?6 months  ? ?Any Other Special Instructions Will Be Listed Below (If Applicable). ? ? ?If you need a refill on your cardiac medications before your next appointment, please call your pharmacy. ? ?

## 2021-12-23 NOTE — Progress Notes (Signed)
? ? ? ?Clinical Summary ?Dylan Cline is a 71 y.o.male seen today as a new patient, last seen in our office in 2017 ?  ? ?  ?1. Palpitations ?- can feel heart racing at times ?- occurs 1-2 times per month. Can last a few hours at time. +lightheaded, dizzy,SOB ?- no specific trigger ?  ? 09/2021  monitor runs of SVT, longest 1 hr 26 min.Short runs NSVT ?- started lopressor '25mg'$  bid, but did not stary ?- ongoing symptoms, about 1-2 times per month.  ?  ?2. HTN ?- has not taken meds last few days.  ? ?3. Aortic regurgitation ?- 09/2021 echo mild to mod AI ?Past Medical History:  ?Diagnosis Date  ? Acute type 2 diabetes mellitus with manifestations ([place])   ? no meds currently  ? CRI (chronic renal insufficiency), stage 3 (moderate) (HCC)   ? GERD (gastroesophageal reflux disease)   ? Hypertension   ? Mass of left parotid gland   ? Pancreatitis   ? Pancreatitis, alcoholic, acute 77/06/3902  ? Pancreatitis, alcoholic, acute 00/06/2329  ? Smoker   ? ? ? ?No Known Allergies ? ? ?Current Outpatient Medications  ?Medication Sig Dispense Refill  ? atorvastatin (LIPITOR) 40 MG tablet Take 40 mg by mouth daily.    ? Cholecalciferol (VITAMIN D) 125 MCG (5000 UT) CAPS Take 5,000 Units by mouth daily.    ? cyclobenzaprine (FLEXERIL) 10 MG tablet Take 1 tablet (10 mg total) by mouth 2 (two) times daily as needed for muscle spasms. 20 tablet 0  ? diclofenac Sodium (VOLTAREN) 1 % GEL Apply 2 g topically daily as needed (arthritis pain).    ? hydroxypropyl methylcellulose / hypromellose (ISOPTO TEARS / GONIOVISC) 2.5 % ophthalmic solution Place 1 drop into both eyes as needed for dry eyes.    ? LEVEMIR FLEXTOUCH 100 UNIT/ML Pen Inject 25 Units into the skin at bedtime.     ? metoprolol tartrate (LOPRESSOR) 25 MG tablet Take 1 tablet (25 mg total) by mouth 2 (two) times daily. 180 tablet 1  ? Multiple Vitamin (MULTIVITAMIN WITH MINERALS) TABS tablet Take 1 tablet by mouth daily.    ? pantoprazole (PROTONIX) 40 MG tablet Take 1 tablet (40 mg  total) by mouth daily. 30 tablet 11  ? predniSONE (STERAPRED UNI-PAK 21 TAB) 10 MG (21) TBPK tablet 10 mg DS 12 as directed 48 tablet 0  ? Tiotropium Bromide Monohydrate (SPIRIVA RESPIMAT) 2.5 MCG/ACT AERS Inhale 2 puffs into the lungs daily. 4 g 5  ? vitamin B-12 (CYANOCOBALAMIN) 1000 MCG tablet Take 1,000 mcg by mouth daily.    ? ?No current facility-administered medications for this visit.  ? ? ? ?Past Surgical History:  ?Procedure Laterality Date  ? CARPAL TUNNEL RELEASE Right 08/22/2021  ? Procedure: CARPAL TUNNEL RELEASE;  Surgeon: Mordecai Rasmussen, MD;  Location: [facility];  Service: Orthopedics;  Laterality: Right;  ? CATARACT EXTRACTION W/PHACO Right 11/24/2019  ? Procedure: CATARACT EXTRACTION PHACO AND INTRAOCULAR LENS PLACEMENT (IOC);  Surgeon: [name], MD;  Location: [facility];  Service: Ophthalmology;  Laterality: Right;  CDE: 4.96  ? CATARACT EXTRACTION W/PHACO Left 07/26/2020  ? Procedure: CATARACT EXTRACTION PHACO AND INTRAOCULAR LENS PLACEMENT (IOC);  Surgeon: [name], MD;  Location: [facility];  Service: Ophthalmology;  Laterality: Left;  CDE: 6.94  ? COLONOSCOPY   01/27/2005  ? QTM:AUQJFHLK hemorrhoids, otherwise normal rectum/Normal colon, marginal prep made the exam more difficult  ? ESOPHAGOGASTRODUODENOSCOPY   10/12/2004  ? TGY:BWLSLH esophagus/Focal area of submucosal  petechial hemorrhage in the fundus (likely a trauma-induced phenomenon secondary to vomiting), otherwise normal  ? LAPAROSCOPIC GASTROTOMY W/ REPAIR OF ULCER    ? PAROTIDECTOMY Left 07/29/2019  ? Procedure: LEFT PAROTIDECTOMY;  Surgeon: Leta Baptist, MD;  Location: [facility];  Service: ENT;  Laterality: Left;  ? TRIGGER FINGER RELEASE Right 08/22/2021  ? Procedure: RELEASE TRIGGER FINGER/A-1 PULLEY;  Surgeon: Mordecai Rasmussen, MD;  Location: [facility];  Service: Orthopedics;  Laterality: Right;  Right long and ring finger trigger release  ? ? ? ?No Known Allergies ? ? ? ?Family History  ?Problem Relation Age of Onset  ?  Diabetes Mother   ? Diabetes Father   ? Heart attack Maternal Grandfather   ? Cancer Paternal Grandmother   ? Heart attack Maternal Uncle   ? Cancer Maternal Aunt   ? Cancer Maternal Uncle   ? Colon cancer Neg Hx   ? Pancreatic cancer Neg Hx   ? Stomach cancer Neg Hx   ? ? ? ?Social History ?Mr. Macphee reports that he has been smoking cigarettes. He started smoking about 51 years ago. He has a 40.00 pack-year smoking history. He has never used smokeless tobacco. ?Mr. Sovine reports current alcohol use. ? ? ?Review of Systems ?CONSTITUTIONAL: No weight loss, fever, chills, weakness or fatigue.  ?HEENT: Eyes: No visual loss, blurred vision, double vision or yellow sclerae.No hearing loss, sneezing, congestion, runny nose or sore throat.  ?SKIN: No rash or itching.  ?CARDIOVASCULAR: per hpi ?RESPIRATORY: No shortness of breath, cough or sputum.  ?GASTROINTESTINAL: No anorexia, nausea, vomiting or diarrhea. No abdominal pain or blood.  ?GENITOURINARY: No burning on urination, no polyuria ?NEUROLOGICAL: No headache, dizziness, syncope, paralysis, ataxia, numbness or tingling in the extremities. No change in bowel or bladder control.  ?MUSCULOSKELETAL: No muscle, back pain, joint pain or stiffness.  ?LYMPHATICS: No enlarged nodes. No history of splenectomy.  ?PSYCHIATRIC: No history of depression or anxiety.  ?ENDOCRINOLOGIC: No reports of sweating, cold or heat intolerance. No polyuria or polydipsia.  ?. ? ? ?Physical Examination ?Today's Vitals  ? 12/23/21 1555  ?BP: (!) 154/82  ?Pulse: 82  ?SpO2: 98%  ?Weight: 168 lb 9.6 oz (76.5 kg)  ?Height: '5\' 9"'$  (1.753 m)  ? ?Body mass index is 24.9 kg/m?. ? ?Gen: resting comfortably, no acute distress ?HEENT: no scleral icterus, pupils equal round and reactive, no palptable cervical adenopathy,  ?CV: RRR, 2/6 systolic murmu rusb no jvd ?Resp: Clear to auscultation bilaterally ?GI: abdomen is soft, non-tender, non-distended, normal bowel sounds, no hepatosplenomegaly ?MSK:  extremities are warm, no edema.  ?Skin: warm, no rash ?Neuro:  no focal deficits ?Psych: appropriate affect ? ? ?Diagnostic Studies ?03/2016 echo ?Study Conclusions  ? ?- Left ventricle: The cavity size was normal. Wall thickness was  ?  increased in a pattern of mild LVH. Systolic function was normal.  ?  The estimated ejection fraction was in the range of 55% to 60%.  ?  Wall motion was normal; there were no regional wall motion  ?  abnormalities. Doppler parameters are consistent with abnormal  ?  left ventricular relaxation (grade 1 diastolic dysfunction).  ?- Aortic valve: Mildly calcified annulus. Trileaflet; mildly  ?  calcified leaflets. There was mild regurgitation.  ?- Mitral valve: Calcified annulus. Mildly thickened leaflets .  ?  There was trivial regurgitation.  ?- Right atrium: The atrium was mildly dilated. Central venous  ?  pressure (est): 3 mm Hg.  ?- Tricuspid valve: There was  trivial regurgitation.  ?- Pulmonary arteries: Systolic pressure could not be accurately  ?  estimated.  ?- Pericardium, extracardiac: There was no pericardial effusion.  ? ?09/2021 monitor ?14 day monitor ?Occasional supraventricular ectopy in the form of isolated PACs, rare couplets and triplets. 59 runs of SVT longest lasting 1 hr 26 min. ?Rare ventricular ectopy in the form of isolated PVCs, couplets, triplets. 9 runs of NSVT longest 11 beats ?No reported symptoms ? ?09/2021 echo ?1. Left ventricular ejection fraction, by estimation, is 55 to 60%. The  ?left ventricle has normal function. The left ventricle has no regional  ?wall motion abnormalities. There is severe left ventricular hypertrophy.  ?Left ventricular diastolic parameters  ? are consistent with Grade I diastolic dysfunction (impaired relaxation).  ? 2. Right ventricular systolic function is normal. The right ventricular  ?size is normal. There is normal pulmonary artery systolic pressure.  ? 3. Left atrial size was mildly dilated.  ? 4. Right atrial size  was mild to moderately dilated.  ? 5. The mitral valve is normal in structure. Trivial mitral valve  ?regurgitation.  ? 6. The aortic valve is tricuspid. Aortic valve regurgitation is mild to  ?moderate. Aortic

## 2022-01-04 ENCOUNTER — Encounter: Payer: Self-pay | Admitting: Pulmonary Disease

## 2022-01-04 ENCOUNTER — Ambulatory Visit (INDEPENDENT_AMBULATORY_CARE_PROVIDER_SITE_OTHER): Payer: No Typology Code available for payment source | Admitting: Pulmonary Disease

## 2022-01-04 VITALS — BP 142/80 | HR 56 | Ht 69.0 in | Wt 169.2 lb

## 2022-01-04 DIAGNOSIS — J432 Centrilobular emphysema: Secondary | ICD-10-CM

## 2022-01-04 DIAGNOSIS — F17210 Nicotine dependence, cigarettes, uncomplicated: Secondary | ICD-10-CM | POA: Diagnosis not present

## 2022-01-04 MED ORDER — ANORO ELLIPTA 62.5-25 MCG/ACT IN AEPB: 1.0000 | INHALATION_SPRAY | Freq: Every day | RESPIRATORY_TRACT | 5 refills | Status: DC

## 2022-01-04 NOTE — Progress Notes (Signed)
? ? ? ?Subjective:  ? ?PATIENT ID: Dylan Cline GENDER: male DOB: 1951/02/24, MRN: [id number] ? ? ?HPI ? ?Chief Complaint  ?Patient presents with  ? Follow-up  ?  No issues, nothing to f/u on   ? ?[name] is a 71 year old male current smoker with emphysema, HTN, DM2, hx alcoholism and pancreatitis who presents for follow-up  ? ?Synopsis: ?At baseline he is an active smoker, smoking 1 ppd since he was a teenager. Has shortness of breath with moderate exertion but denies limitation in activity. On initial consult in 10/2018 he had a CT Chest completed on 06/02/18 that demonstrated multiple pulmonary nodules with the largest measuring 28m. Follow-up lung screen with unchanged including RUL nodule ~759msince 11/2019 (16 months ago). He reports long standing shortness of breath even before smoking. He started smoking at [age]ears old. Denies cough or wheezing. He does not routinely exercise. He is limited by hip pain.  ? ?07/04/21 ?Since our last visit, he has tried Spiriva and may have improved on it. Minimal cough. Shortness of breath with activity. Never has had wheezing. He started a part-time job at [employer] will notice palpitations and dizziness with sudden movements including stooping to standing. He drinks only two glasses a day. He smokes 1 ppd daily. He is still not very active and has not returned his call to Pulmonary rehab. ? ?01/04/22 ?He reports compliance with Spiriva two puffs daily. Sometimes will need an extra puff every other week. Denies coughing and wheezing. Has cut down to 1/2 pack down. Does not smoke with his co-workers. Has not be active at baseline. Denies any exacerbations since our last visit. ? ?Social History: Active smoker.  40-pack-year history. 1-1.5 ppd daily ? ?Past Medical History:  ?Diagnosis Date  ? Acute type 2 diabetes [place] with manifestations [place]  ? no meds currently  ? CRI (chronic renal insufficiency), stage 3 (moderate) ([facility])   ? GERD (gastroesophageal  reflux disease)   ? Hypertension   ? Mass of left parotid gland   ? Pancreatitis   ? Pancreatitis, alcoholic, acute 1157/11/2200? Pancreatitis, alcoholic, acute 1154/11/7060? Smoker   ?  ? ?Family History  ?Problem Relation Age of Onset  ? Diabetes Mother   ? Diabetes Father   ? Heart attack Maternal Grandfather   ? Cancer Paternal Grandmother   ? Heart attack Maternal Uncle   ? Cancer Maternal Aunt   ? Cancer Maternal Uncle   ? Colon cancer Neg Hx   ? Pancreatic cancer Neg Hx   ? Stomach cancer Neg Hx   ?  ? ?Social History  ? ?Occupational History  ? Not on file  ?Tobacco Use  ? Smoking status: Every Day  ?  Packs/day: 1.00  ?  Years: 40.00  ?  Pack years: 40.00  ?  Types: Cigarettes  ?  Start date: 12/01/1970  ? Smokeless tobacco: Never  ?Vaping Use  ? Vaping Use: Never used  ?Substance and Sexual Activity  ? Alcohol use: Yes  ?  Comment: 10/11/21 drinks a bottle of wine a day  ? Drug use: Yes  ?  Types: Marijuana  ?  Comment: occasionally to stimulate appetite  ? Sexual activity: Not on file  ? ? ?No Known Allergies  ? ?Outpatient Medications Prior to Visit  ?Medication Sig Dispense Refill  ? amLODipine (NORVASC) 5 MG tablet Take 1 tablet (5 mg total) by mouth daily. 90 tablet 3  ?  atorvastatin (LIPITOR) 40 MG tablet Take 40 mg by mouth daily.    ? diclofenac Sodium (VOLTAREN) 1 % GEL Apply 2 g topically daily as needed (arthritis pain).    ? LEVEMIR FLEXTOUCH 100 UNIT/ML Pen Inject 25 Units into the skin at bedtime.     ? metoprolol tartrate (LOPRESSOR) 25 MG tablet Take 1 tablet (25 mg total) by mouth 2 (two) times daily. 180 tablet 1  ? pantoprazole (PROTONIX) 40 MG tablet Take 1 tablet (40 mg total) by mouth daily. 30 tablet 11  ? predniSONE (STERAPRED UNI-PAK 21 TAB) 10 MG (21) TBPK tablet 10 mg DS 12 as directed 48 tablet 0  ? Tiotropium Bromide Monohydrate (SPIRIVA RESPIMAT) 2.5 MCG/ACT AERS Inhale 2 puffs into the lungs daily. 4 g 5  ? Cholecalciferol (VITAMIN D) 125 MCG (5000 UT) CAPS Take 5,000 Units by  mouth daily. (Patient not taking: Reported on 01/04/2022)    ? cyclobenzaprine (FLEXERIL) 10 MG tablet Take 1 tablet (10 mg total) by mouth 2 (two) times daily as needed for muscle spasms. (Patient not taking: Reported on 01/04/2022) 20 tablet 0  ? hydroxypropyl methylcellulose / hypromellose (ISOPTO TEARS / GONIOVISC) 2.5 % ophthalmic solution Place 1 drop into both eyes as needed for dry eyes. (Patient not taking: Reported on 01/04/2022)    ? Multiple Vitamin (MULTIVITAMIN WITH MINERALS) TABS tablet Take 1 tablet by mouth daily. (Patient not taking: Reported on 01/04/2022)    ? vitamin B-12 (CYANOCOBALAMIN) 1000 MCG tablet Take 1,000 mcg by mouth daily. (Patient not taking: Reported on 01/04/2022)    ? ?No facility-administered medications prior to visit.  ? ? ?Review of Systems  ?Constitutional:  Negative for chills, diaphoresis, fever, malaise/fatigue and weight loss.  ?HENT:  Negative for congestion.   ?Respiratory:  Positive for shortness of breath. Negative for cough, hemoptysis, sputum production and wheezing.   ?Cardiovascular:  Negative for chest pain, palpitations and leg swelling.  ? ?Objective:  ? ?Vitals:  ? 01/04/22 0908  ?BP: (!) 142/80  ?Pulse: (!) 56  ?SpO2: 99%  ?Weight: 169 lb 3.2 oz (76.7 kg)  ?Height: '5\' 9"'$  (1.753 m)  ? ?SpO2: 99 % ?O2 Device: None (Room air) ? ?Physical Exam: ?General: Well-appearing, no acute distress ?HENT: [place], AT ?Eyes: EOMI, no scleral icterus ?Respiratory: Clear to auscultation bilaterally.  No crackles, wheezing or rales ?Cardiovascular: RRR, -M/R/G, no JVD ?Extremities:-Edema,-tenderness ?Neuro: AAO x4, CNII-XII grossly intact ?Psych: Normal mood, normal affect ? ?Data reviewed ? ?Imaging: ?CT Chest 06/12/2018 [facility] (report only, no imaging available) - "Mildly enlarged AP window lymph node measuring 1.4 x 1.2 cm (image 37).Marland KitchenMarland KitchenMild predominant paraseptal and centrilobular emphysema. There is an 8 mm right upper lobe nodule adjacent to the fissure, 6 mm anterior right upper lobe  nodule, 6 mm left lower lobe nodule and 6 mm left lower lobe nodule (image 267). Impression: Multiple pulmonary nodules, the largest measuring 8 mm.  ?CT abdomen pelvis 05/24/2013-lower lobes with minimal atelectasis in the bases ?CT Chest Lung Screen 03/09/21 - Stable lung nodules with largest 7.2 mm. Paraseptal and centrilobular emphysema. ? ?PFT: ?05/19/2020 ?FVC 3.14 (86%) FEV1 2.61 (96%) ratio 73 TLC 86% DLCO 83%.  No significant BD ?Interpretation: Normal PFTs ? ?Labs: ?Absolute eos 07/25/19-100 ? ?Health Maintenance ? ?There is no immunization history on file for this patient. ?Declined influenza ? ?CT Lung Screen - enrolled ?   ?Assessment & Plan:  ?71 year old male current smoker with emphysema, hypertension, SVT, aortic regurg, diabetes who presents for follow-up.  On LAMA.  Discussed clinical course and management of COPD including bronchodilator regimen and action plan for exacerbation. No exacerbations in >6 months ? ?Emphysema/COPD GOLD Class B - remains symptomatic ?--STOP Spiriva ?--START Anoro ONE puff ONCE a day ?--Declined Pulmonary Rehab ? ?Tobacco abuse ?Patient is an active smoker. ?We discussed smoking cessation for 45 minutes. We discussed triggers and stressors and ways to deal with them. We discussed barriers to continued smoking and benefits of smoking cessation. Provided patient with information cessation techniques ? ?Multiple lung nodules - stable ?Continue annual CT lung screen-due June 2023 ? ?Orders Placed This Encounter  ?Procedures  ? Pulmonary function test  ?  Standing Status:   Future  ?  Standing Expiration Date:   01/05/2023  ?  Order Specific Question:   Where should this test be performed?  ?  Answer:   [facility]  ?  Order Specific Question:   Full PFT: includes the following: basic spirometry, spirometry pre & post bronchodilator, diffusion capacity (DLCO), lung volumes  ?  Answer:   Full PFT  ? ?Meds ordered this encounter  ?Medications  ? umeclidinium-vilanterol (ANORO  ELLIPTA) 62.5-25 MCG/ACT AEPB  ?  Sig: Inhale 1 puff into the lungs daily.  ?  Dispense:  60 each  ?  Refill:  5  ? ?Return in about 3 months (around 04/05/2022).  ? ?I have spent a total time of 35-minutes on t

## 2022-01-04 NOTE — Patient Instructions (Addendum)
Emphysema/COPD GOLD Class B - uncontrolled symptoms ?--STOP Spiriva ?--START Anoro ONE puff ONCE a day ?--ORDER pulmonary function tests ? ?Follow-up with me in 3 months with PFTs prior to visit ?

## 2022-01-13 DIAGNOSIS — I10 Essential (primary) hypertension: Secondary | ICD-10-CM | POA: Diagnosis not present

## 2022-01-13 DIAGNOSIS — E1122 Type 2 diabetes mellitus with diabetic chronic kidney disease: Secondary | ICD-10-CM | POA: Diagnosis not present

## 2022-01-13 DIAGNOSIS — N401 Enlarged prostate with lower urinary tract symptoms: Secondary | ICD-10-CM | POA: Diagnosis not present

## 2022-01-25 DIAGNOSIS — J439 Emphysema, unspecified: Secondary | ICD-10-CM | POA: Diagnosis not present

## 2022-01-25 DIAGNOSIS — G894 Chronic pain syndrome: Secondary | ICD-10-CM | POA: Diagnosis not present

## 2022-01-25 DIAGNOSIS — D631 Anemia in chronic kidney disease: Secondary | ICD-10-CM | POA: Diagnosis not present

## 2022-01-25 DIAGNOSIS — N184 Chronic kidney disease, stage 4 (severe): Secondary | ICD-10-CM | POA: Diagnosis not present

## 2022-01-25 DIAGNOSIS — R008 Other abnormalities of heart beat: Secondary | ICD-10-CM | POA: Diagnosis not present

## 2022-01-25 DIAGNOSIS — E782 Mixed hyperlipidemia: Secondary | ICD-10-CM | POA: Diagnosis not present

## 2022-01-25 DIAGNOSIS — N401 Enlarged prostate with lower urinary tract symptoms: Secondary | ICD-10-CM | POA: Diagnosis not present

## 2022-01-25 DIAGNOSIS — I10 Essential (primary) hypertension: Secondary | ICD-10-CM | POA: Diagnosis not present

## 2022-01-25 DIAGNOSIS — E1122 Type 2 diabetes mellitus with diabetic chronic kidney disease: Secondary | ICD-10-CM | POA: Diagnosis not present

## 2022-02-09 DIAGNOSIS — G894 Chronic pain syndrome: Secondary | ICD-10-CM | POA: Diagnosis not present

## 2022-02-09 DIAGNOSIS — I10 Essential (primary) hypertension: Secondary | ICD-10-CM | POA: Diagnosis not present

## 2022-02-09 DIAGNOSIS — M1990 Unspecified osteoarthritis, unspecified site: Secondary | ICD-10-CM | POA: Diagnosis not present

## 2022-03-08 DIAGNOSIS — E1122 Type 2 diabetes mellitus with diabetic chronic kidney disease: Secondary | ICD-10-CM | POA: Diagnosis not present

## 2022-03-08 DIAGNOSIS — R399 Unspecified symptoms and signs involving the genitourinary system: Secondary | ICD-10-CM | POA: Diagnosis not present

## 2022-03-08 DIAGNOSIS — I129 Hypertensive chronic kidney disease with stage 1 through stage 4 chronic kidney disease, or unspecified chronic kidney disease: Secondary | ICD-10-CM | POA: Diagnosis not present

## 2022-03-08 DIAGNOSIS — R809 Proteinuria, unspecified: Secondary | ICD-10-CM | POA: Diagnosis not present

## 2022-03-08 DIAGNOSIS — N183 Chronic kidney disease, stage 3 unspecified: Secondary | ICD-10-CM | POA: Diagnosis not present

## 2022-03-08 DIAGNOSIS — Z720 Tobacco use: Secondary | ICD-10-CM | POA: Diagnosis not present

## 2022-03-08 DIAGNOSIS — E872 Acidosis, unspecified: Secondary | ICD-10-CM | POA: Diagnosis not present

## 2022-03-09 ENCOUNTER — Ambulatory Visit (HOSPITAL_COMMUNITY): Admission: RE | Admit: 2022-03-09 | Payer: No Typology Code available for payment source | Source: Ambulatory Visit

## 2022-04-03 ENCOUNTER — Telehealth: Payer: Self-pay | Admitting: Orthopedic Surgery

## 2022-04-03 NOTE — Telephone Encounter (Signed)
Patient/wife, designated contact on file, requests either prescription for left hand/wrist pain, said hurting again, or if needs to be seen for appointment (discussed next available options).  Please advise. [phone]

## 2022-04-05 NOTE — Telephone Encounter (Signed)
Wife called again to inquire on a medication for him.  I am scheduling an appt.

## 2022-04-05 NOTE — Telephone Encounter (Signed)
Wife states it is the right hand and wrist, not left.  Appt made 7/7 830 am for eval.

## 2022-04-06 ENCOUNTER — Other Ambulatory Visit: Payer: Self-pay | Admitting: Cardiology

## 2022-04-07 ENCOUNTER — Encounter: Payer: Self-pay | Admitting: Orthopedic Surgery

## 2022-04-07 ENCOUNTER — Ambulatory Visit (INDEPENDENT_AMBULATORY_CARE_PROVIDER_SITE_OTHER): Payer: No Typology Code available for payment source | Admitting: Orthopedic Surgery

## 2022-04-07 VITALS — Ht 69.0 in | Wt 169.0 lb

## 2022-04-07 DIAGNOSIS — M25531 Pain in right wrist: Secondary | ICD-10-CM | POA: Diagnosis not present

## 2022-04-07 DIAGNOSIS — M7989 Other specified soft tissue disorders: Secondary | ICD-10-CM | POA: Diagnosis not present

## 2022-04-07 MED ORDER — PREDNISONE 10 MG (21) PO TBPK: ORAL_TABLET | ORAL | 0 refills | Status: DC

## 2022-04-07 NOTE — Progress Notes (Signed)
Orthopaedic Postop Note  Assessment: Dylan Cline is a 71 y.o. male s/p right open carpal tunnel release, right ring and long finger trigger release; DOS: 08/22/21  Persistent hand pain  Plan: Continues to have pain in the ulnar, with associated swelling.  Prednisone improved his symptoms last time, and he would like to try prednisone once again.  I informed him that this is not a medicine that we will continue to use chronically, due to the side effects.  He states his understanding.  I also told him that I would not provide him with narcotic pain medication, as this is reserved for surgery only.   Follow-up: Return if symptoms worsen or fail to improve. XR at next visit: None  Subjective:  Chief Complaint  Patient presents with   Hand Pain    Bilat hands still painful     History of Present Illness: Dylan Cline is a 71 y.o. male who previously had surgery on his right hand.  He continues to have pain and swelling in bilateral hands.  Prior x-rays demonstrated diffuse arthritis throughout the hand, as well as his wrists.  The last time I saw him in clinic, continue prednisone.  He states this is helpful.  He would like to try the prednisone again.  Review of Systems: No fevers or chills + numbness & tingling No Chest Pain No shortness of breath   Objective: Ht '5\' 9"'$  (1.753 m)   Wt 169 lb (76.7 kg)   BMI 24.96 kg/m   Physical Exam:  Alert and oriented.  No acute distress.  Right hand surgical incisions have healed.  No surrounding erythema or drainage.  He has full range of motion of all of his fingers.  Mild diffuse swelling in both hands.  No acute deformity.  Fingers are warm well perfused.  IMAGING: I personally ordered and reviewed the following images:  No new imaging obtained today.  Mordecai Rasmussen, MD 04/07/2022 8:43 AM

## 2022-04-12 ENCOUNTER — Encounter: Payer: Self-pay | Admitting: Pulmonary Disease

## 2022-04-12 ENCOUNTER — Ambulatory Visit (INDEPENDENT_AMBULATORY_CARE_PROVIDER_SITE_OTHER): Payer: No Typology Code available for payment source | Admitting: Pulmonary Disease

## 2022-04-12 VITALS — BP 122/70 | HR 78 | Temp 98.0°F | Ht 68.5 in | Wt 167.0 lb

## 2022-04-12 DIAGNOSIS — J432 Centrilobular emphysema: Secondary | ICD-10-CM

## 2022-04-12 DIAGNOSIS — F17210 Nicotine dependence, cigarettes, uncomplicated: Secondary | ICD-10-CM

## 2022-04-12 MED ORDER — ANORO ELLIPTA 62.5-25 MCG/ACT IN AEPB: 1.0000 | INHALATION_SPRAY | Freq: Every day | RESPIRATORY_TRACT | 11 refills | Status: DC

## 2022-04-12 NOTE — Patient Instructions (Signed)
Full PFT performed today. °

## 2022-04-12 NOTE — Progress Notes (Signed)
Full PFT performed today. °

## 2022-04-12 NOTE — Patient Instructions (Signed)
Emphysema/COPD GOLD Class B - remains symptomatic --CONTINUE Anoro ONE puff ONCE a day --Declined Pulmonary Rehab  Tobacco abuse Patient is an active smoker. We discussed smoking cessation for 5 minutes. We discussed triggers and stressors and ways to deal with them. We discussed barriers to continued smoking and benefits of smoking cessation. Provided patient with information cessation techniques and interventions including  quitline.

## 2022-04-12 NOTE — Progress Notes (Signed)
Subjective:   PATIENT ID: Dylan Cline GENDER: male DOB: 11-16-1950, MRN: [id number]   HPI  Chief Complaint  Patient presents with   Follow-up    Patient did PFT today and would like to go over results. No concerns at this time.    Dylan Cline is a 71 year old male current smoker with emphysema, HTN, DM2, hx alcoholism and pancreatitis who presents for follow-up   Synopsis: At baseline he is an active smoker, smoking 1 ppd since he was a teenager. Has shortness of breath with moderate exertion but denies limitation in activity. On initial consult in 10/2018 he had a CT Chest completed on 06/02/18 that demonstrated multiple pulmonary nodules with the largest measuring 8mm. Follow-up lung screen with unchanged including RUL nodule ~70mm since 11/2019 (16 months ago). He reports long standing shortness of breath even before smoking. He started smoking at 71 years old. Denies cough or wheezing. He does not routinely exercise. He is limited by hip pain.   07/04/21 Since our last visit, he has tried Spiriva and may have improved on it. Minimal cough. Shortness of breath with activity. Never has had wheezing. He started a part-time job at [employer]. He will notice palpitations and dizziness with sudden movements including stooping to standing. He drinks only two glasses a day. He smokes 1 ppd daily. He is still not very active and has not returned his call to Pulmonary rehab.  01/04/22 He reports compliance with Spiriva two puffs daily. Sometimes will need an extra puff every other week. Denies coughing and wheezing. Has cut down to 1/2 pack down. Does not smoke with his co-workers. Has not be active at baseline. Denies any exacerbations since our last visit.  04/12/22 He is compliant Anoro. Decreased smoking to <1ppd. Reports shortness of breath with activity. 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 Not active at baseline due to hot weather. Denies coughing or wheezing.  Social History: Active  smoker.  40-pack-year history. 1-1.5 ppd daily  Past Medical History:  Diagnosis Date   Acute type 2 diabetes mellitus with manifestations (HCC)    no meds currently   CRI (chronic renal insufficiency), stage 3 (moderate) (HCC)    GERD (gastroesophageal reflux disease)    Hypertension    Mass of left parotid gland    Pancreatitis    Pancreatitis, alcoholic, acute 08/10/2014   Pancreatitis, alcoholic, acute 08/10/2014   Smoker      Family History  Problem Relation Age of Onset   Diabetes Mother    Diabetes Father    Heart attack Maternal Grandfather    Cancer Paternal Grandmother    Heart attack Maternal Uncle    Cancer Maternal Aunt    Cancer Maternal Uncle    Colon cancer Neg Hx    Pancreatic cancer Neg Hx    Stomach cancer Neg Hx      Social History   Occupational History   Not on file  Tobacco Use   Smoking status: Every Day    Packs/day: 1.00    Years: 40.00    Total pack years: 40.00    Types: Cigarettes    Start date: 12/01/1970   Smokeless tobacco: Never   Tobacco comments:    Smokes a little less than 1 pack per day [facility] 04/12/22  Vaping Use   Vaping Use: Never used  Substance and Sexual Activity   Alcohol use: Yes    Comment: 10/11/21 drinks a bottle of wine a day  Drug use: Yes    Types: Marijuana    Comment: occasionally to stimulate appetite   Sexual activity: Not on file    No Known Allergies   Outpatient Medications Prior to Visit  Medication Sig Dispense Refill   amLODipine (NORVASC) 10 MG tablet Take 10 mg by mouth daily.     atorvastatin (LIPITOR) 40 MG tablet Take 40 mg by mouth daily.     Cholecalciferol (VITAMIN D) 125 MCG (5000 UT) CAPS Take 5,000 Units by mouth daily.     cyclobenzaprine (FLEXERIL) 10 MG tablet Take 1 tablet (10 mg total) by mouth 2 (two) times daily as needed for muscle spasms. 20 tablet 0   diclofenac Sodium (VOLTAREN) 1 % GEL Apply 2 g topically daily as needed (arthritis pain).     hydroxypropyl methylcellulose /  hypromellose (ISOPTO TEARS / GONIOVISC) 2.5 % ophthalmic solution Place 1 drop into both eyes as needed for dry eyes.     JANUVIA 50 MG tablet Take 50 mg by mouth daily.     LEVEMIR FLEXTOUCH 100 UNIT/ML Pen Inject 25 Units into the skin at bedtime.      metoprolol tartrate (LOPRESSOR) 25 MG tablet Take 1 tablet by mouth twice daily 180 tablet 0   Multiple Vitamin (MULTIVITAMIN WITH MINERALS) TABS tablet Take 1 tablet by mouth daily.     pantoprazole (PROTONIX) 40 MG tablet Take 1 tablet (40 mg total) by mouth daily. 30 tablet 11   predniSONE (STERAPRED UNI-PAK 21 TAB) 10 MG (21) TBPK tablet 10 mg DS 12 as directed 48 tablet 0   tamsulosin (FLOMAX) 0.4 MG CAPS capsule Take 0.8 mg by mouth at bedtime.     umeclidinium-vilanterol (ANORO ELLIPTA) 62.5-25 MCG/ACT AEPB Inhale 1 puff into the lungs daily. 60 each 5   vitamin B-12 (CYANOCOBALAMIN) 1000 MCG tablet Take 1,000 mcg by mouth daily.     No facility-administered medications prior to visit.    Review of Systems  Constitutional:  Negative for chills, diaphoresis, fever, malaise/fatigue and weight loss.  HENT:  Negative for congestion.   Respiratory:  Positive for shortness of breath. Negative for cough, hemoptysis, sputum production and wheezing.   Cardiovascular:  Negative for chest pain, palpitations and leg swelling.    Objective:   Vitals:   04/12/22 1519  BP: 122/70  Pulse: 78  Temp: 98 F (36.7 C)  TempSrc: Oral  SpO2: 98%  Weight: 167 lb (75.8 kg)  Height: 5' 8.5" (1.74 m)   SpO2: 98 % O2 Device: None (Room air)  Physical Exam: General: Well-appearing, no acute distress HENT: [place], AT Eyes: EOMI, no scleral icterus Respiratory: Clear to auscultation bilaterally.  No crackles, wheezing or rales Cardiovascular: RRR, -M/R/G, no JVD Extremities:-Edema,-tenderness Neuro: AAO x4, CNII-XII grossly intact Psych: Normal mood, normal affect  Data reviewed  Imaging: CT Chest 06/12/2018 [facility] (report only, no imaging  available) - "Mildly enlarged AP window lymph node measuring 1.4 x 1.2 cm (image 37).Marland KitchenMarland KitchenMild predominant paraseptal and centrilobular emphysema. There is an 8 mm right upper lobe nodule adjacent to the fissure, 6 mm anterior right upper lobe nodule, 6 mm left lower lobe nodule and 6 mm left lower lobe nodule (image 267). Impression: Multiple pulmonary nodules, the largest measuring 8 mm.  CT abdomen pelvis 05/24/2013-lower lobes with minimal atelectasis in the bases CT Chest Lung Screen 03/09/21 - Stable lung nodules with largest 7.2 mm. Paraseptal and centrilobular emphysema.  PFT: 05/19/2020 FVC 3.14 (86%) FEV1 2.61 (96%) ratio 73 TLC 86% DLCO 83%.  No significant BD Interpretation: Normal PFTs  04/12/22 FVC 1.83 (44%) FEV1 0.84 (28%) Ratio 80  TLC 82% DLCO 59% Interpretation: Reduced FVC and FEV1. No obstruction and moderately reduced gas exchange  Labs: Absolute eos 07/25/19-100  Health Maintenance  There is no immunization history on file for this patient. Declined influenza  CT Lung Screen - enrolled    Assessment & Plan:  71 year old male current smoker with emphysema, HTN< SVT, aortic regurgitation DM2 who presents for follow-up. On LAMA/LABA. No recent exacerbations. Counseled on smoking cessation. Discussed clinical course and management of COPD including bronchodilator regimen and action plan for exacerbation.   Emphysema/COPD GOLD Class B - remains symptomatic --CONTINUE Anoro ONE puff ONCE a day --Declined Pulmonary Rehab  Tobacco abuse Patient is an active smoker. We discussed smoking cessation for 5 minutes. We discussed triggers and stressors and ways to deal with them. We discussed barriers to continued smoking and benefits of smoking cessation. Provided patient with information cessation techniques and interventions including [place] quitline.  Multiple lung nodules - stable Continue annual CT lung screen-due June 2023. Messaged NP Groce (pt missed appts)  No orders of  the defined types were placed in this encounter.  Meds ordered this encounter  Medications   umeclidinium-vilanterol (ANORO ELLIPTA) 62.5-25 MCG/ACT AEPB    Sig: Inhale 1 puff into the lungs daily.    Dispense:  60 each    Refill:  11   Return in about 6 months (around 10/13/2022).   I have spent a total time of -minutes on the day of the appointment including chart review, data review, collecting history, coordinating care and discussing medical diagnosis and plan with the patient/family. Past medical history, allergies, medications were reviewed. Pertinent imaging, labs and tests included in this note have been reviewed and interpreted independently by me.  Zoeann Mol Mechele Collin, MD [facility] 04/12/2022 3:32 PM

## 2022-04-13 LAB — PULMONARY FUNCTION TEST
DL/VA % pred: 108 %
DL/VA: 4.39 ml/min/mmHg/L
DLCO cor % pred: 59 %
DLCO cor: 14.66 ml/min/mmHg
DLCO unc % pred: 59 %
DLCO unc: 14.66 ml/min/mmHg
FEF 25-75 Pre: 2.13 L/sec
FEF2575-%Pred-Pre: 95 %
FEV1-%Change-Post: -63 %
FEV1-%Pred-Post: 28 %
FEV1-%Pred-Pre: 77 %
FEV1-Post: 0.84 L
FEV1-Pre: 2.32 L
FEV1FVC-%Change-Post: -42 %
FEV1FVC-%Pred-Pre: 108 %
FEV6-%Change-Post: -40 %
FEV6-%Pred-Post: 44 %
FEV6-%Pred-Pre: 75 %
FEV6-Post: 1.73 L
FEV6-Pre: 2.9 L
FEV6FVC-%Change-Post: -5 %
FEV6FVC-%Pred-Post: 100 %
FEV6FVC-%Pred-Pre: 106 %
FVC-%Change-Post: -36 %
FVC-%Pred-Post: 44 %
FVC-%Pred-Pre: 70 %
FVC-Post: 1.83 L
FVC-Pre: 2.91 L
Post FEV1/FVC ratio: 46 %
Post FEV6/FVC ratio: 94 %
Pre FEV1/FVC ratio: 80 %
Pre FEV6/FVC Ratio: 100 %
RV % pred: 120 %
RV: 2.91 L
TLC % pred: 82 %
TLC: 5.55 L

## 2022-04-16 ENCOUNTER — Encounter: Payer: Self-pay | Admitting: Pulmonary Disease

## 2022-04-17 ENCOUNTER — Telehealth: Payer: Self-pay | Admitting: Acute Care

## 2022-04-17 ENCOUNTER — Other Ambulatory Visit: Payer: Self-pay | Admitting: *Deleted

## 2022-04-17 DIAGNOSIS — F17210 Nicotine dependence, cigarettes, uncomplicated: Secondary | ICD-10-CM

## 2022-04-17 DIAGNOSIS — Z122 Encounter for screening for malignant neoplasm of respiratory organs: Secondary | ICD-10-CM

## 2022-04-17 DIAGNOSIS — Z87891 Personal history of nicotine dependence: Secondary | ICD-10-CM

## 2022-04-17 NOTE — Telephone Encounter (Signed)
Left message with wife that we were calling to schedule annual LDCT.  She was given [facility] phone number for patient to call back

## 2022-04-20 ENCOUNTER — Telehealth: Payer: Self-pay | Admitting: Orthopedic Surgery

## 2022-04-20 NOTE — Telephone Encounter (Signed)
Patient came to office with a prescription bottle from his pharmacy for Hydrocodone-acetaminophen 7.5-325, which he said Dr Amedeo Kinsman needed to add to his list of medications. The prescriber of the medication is noted as Eloy End, [place].  Patient said he does not need a refill at this time but wanted Dr Amedeo Kinsman to know, as discussed at last office visit.

## 2022-05-04 ENCOUNTER — Ambulatory Visit (HOSPITAL_COMMUNITY): Payer: No Typology Code available for payment source

## 2022-05-15 DIAGNOSIS — E1122 Type 2 diabetes mellitus with diabetic chronic kidney disease: Secondary | ICD-10-CM | POA: Diagnosis not present

## 2022-05-15 DIAGNOSIS — I10 Essential (primary) hypertension: Secondary | ICD-10-CM | POA: Diagnosis not present

## 2022-05-24 DIAGNOSIS — N401 Enlarged prostate with lower urinary tract symptoms: Secondary | ICD-10-CM | POA: Diagnosis not present

## 2022-05-24 DIAGNOSIS — E782 Mixed hyperlipidemia: Secondary | ICD-10-CM | POA: Diagnosis not present

## 2022-05-24 DIAGNOSIS — E1122 Type 2 diabetes mellitus with diabetic chronic kidney disease: Secondary | ICD-10-CM | POA: Diagnosis not present

## 2022-05-24 DIAGNOSIS — R008 Other abnormalities of heart beat: Secondary | ICD-10-CM | POA: Diagnosis not present

## 2022-05-24 DIAGNOSIS — N184 Chronic kidney disease, stage 4 (severe): Secondary | ICD-10-CM | POA: Diagnosis not present

## 2022-05-24 DIAGNOSIS — G894 Chronic pain syndrome: Secondary | ICD-10-CM | POA: Diagnosis not present

## 2022-05-24 DIAGNOSIS — D631 Anemia in chronic kidney disease: Secondary | ICD-10-CM | POA: Diagnosis not present

## 2022-05-24 DIAGNOSIS — I10 Essential (primary) hypertension: Secondary | ICD-10-CM | POA: Diagnosis not present

## 2022-05-24 DIAGNOSIS — R911 Solitary pulmonary nodule: Secondary | ICD-10-CM | POA: Diagnosis not present

## 2022-05-24 DIAGNOSIS — J439 Emphysema, unspecified: Secondary | ICD-10-CM | POA: Diagnosis not present

## 2022-06-01 ENCOUNTER — Other Ambulatory Visit: Payer: Self-pay | Admitting: Family Medicine

## 2022-06-01 ENCOUNTER — Other Ambulatory Visit (HOSPITAL_COMMUNITY): Payer: Self-pay | Admitting: Family Medicine

## 2022-06-01 DIAGNOSIS — R911 Solitary pulmonary nodule: Secondary | ICD-10-CM

## 2022-07-03 ENCOUNTER — Other Ambulatory Visit: Payer: Self-pay | Admitting: Cardiology

## 2022-07-25 ENCOUNTER — Encounter: Payer: Self-pay | Admitting: *Deleted

## 2022-09-07 DIAGNOSIS — D631 Anemia in chronic kidney disease: Secondary | ICD-10-CM | POA: Diagnosis not present

## 2022-09-07 DIAGNOSIS — E782 Mixed hyperlipidemia: Secondary | ICD-10-CM | POA: Diagnosis not present

## 2022-09-07 DIAGNOSIS — Z139 Encounter for screening, unspecified: Secondary | ICD-10-CM | POA: Diagnosis not present

## 2022-09-07 DIAGNOSIS — E119 Type 2 diabetes mellitus without complications: Secondary | ICD-10-CM | POA: Diagnosis not present

## 2022-09-07 DIAGNOSIS — N401 Enlarged prostate with lower urinary tract symptoms: Secondary | ICD-10-CM | POA: Diagnosis not present

## 2022-09-13 DIAGNOSIS — R911 Solitary pulmonary nodule: Secondary | ICD-10-CM | POA: Diagnosis not present

## 2022-09-13 DIAGNOSIS — E1122 Type 2 diabetes mellitus with diabetic chronic kidney disease: Secondary | ICD-10-CM | POA: Diagnosis not present

## 2022-09-13 DIAGNOSIS — N401 Enlarged prostate with lower urinary tract symptoms: Secondary | ICD-10-CM | POA: Diagnosis not present

## 2022-09-13 DIAGNOSIS — E782 Mixed hyperlipidemia: Secondary | ICD-10-CM | POA: Diagnosis not present

## 2022-09-13 DIAGNOSIS — M79671 Pain in right foot: Secondary | ICD-10-CM | POA: Diagnosis not present

## 2022-09-13 DIAGNOSIS — N184 Chronic kidney disease, stage 4 (severe): Secondary | ICD-10-CM | POA: Diagnosis not present

## 2022-09-13 DIAGNOSIS — D631 Anemia in chronic kidney disease: Secondary | ICD-10-CM | POA: Diagnosis not present

## 2022-09-13 DIAGNOSIS — R008 Other abnormalities of heart beat: Secondary | ICD-10-CM | POA: Diagnosis not present

## 2022-09-13 DIAGNOSIS — J439 Emphysema, unspecified: Secondary | ICD-10-CM | POA: Diagnosis not present

## 2022-09-13 DIAGNOSIS — G894 Chronic pain syndrome: Secondary | ICD-10-CM | POA: Diagnosis not present

## 2022-09-13 DIAGNOSIS — I10 Essential (primary) hypertension: Secondary | ICD-10-CM | POA: Diagnosis not present

## 2022-09-13 DIAGNOSIS — G629 Polyneuropathy, unspecified: Secondary | ICD-10-CM | POA: Diagnosis not present

## 2022-10-12 ENCOUNTER — Other Ambulatory Visit: Payer: Self-pay | Admitting: Cardiology

## 2022-10-25 ENCOUNTER — Ambulatory Visit: Payer: No Typology Code available for payment source | Admitting: Podiatry

## 2022-10-25 ENCOUNTER — Ambulatory Visit (INDEPENDENT_AMBULATORY_CARE_PROVIDER_SITE_OTHER): Payer: No Typology Code available for payment source

## 2022-10-25 ENCOUNTER — Ambulatory Visit (INDEPENDENT_AMBULATORY_CARE_PROVIDER_SITE_OTHER): Payer: No Typology Code available for payment source | Admitting: Podiatry

## 2022-10-25 DIAGNOSIS — M79672 Pain in left foot: Secondary | ICD-10-CM | POA: Diagnosis not present

## 2022-10-25 DIAGNOSIS — M79671 Pain in right foot: Secondary | ICD-10-CM

## 2022-10-25 DIAGNOSIS — M7751 Other enthesopathy of right foot: Secondary | ICD-10-CM

## 2022-10-25 DIAGNOSIS — M7752 Other enthesopathy of left foot: Secondary | ICD-10-CM

## 2022-10-25 MED ORDER — BETAMETHASONE SOD PHOS & ACET 6 (3-3) MG/ML IJ SUSP
3.0000 mg | Freq: Once | INTRAMUSCULAR | Status: AC
  Administered 2022-10-25: 3 mg via INTRA_ARTICULAR

## 2022-10-25 NOTE — Progress Notes (Signed)
Chief Complaint  Patient presents with   Bunions    Bilateral bunion and ball of the foot pain, started almost a year ago, rate of pain 9 out of 10. X-rays done     HPI: 72 y.o. male presenting today as a new patient for evaluation of bilateral great toe pain.  Patient states that he continues to work on his feet and steel toed boots but over the past few years he has developed bilateral great toe pain right greater than the left.  Idiopathic onset.  Denies a history of injury.  He does admit to having a bunion deformity of the right foot.  He has not done anything for treatment other than OTC Tylenol arthritis which does help alleviate some of his symptoms  Past Medical History:  Diagnosis Date   Acute type 2 diabetes mellitus with manifestations ([place])    no meds currently   CRI (chronic renal insufficiency), stage 3 (moderate) (HCC)    GERD (gastroesophageal reflux disease)    Hypertension    Mass of left parotid gland    Pancreatitis    Pancreatitis, alcoholic, acute 85/0/2774   Pancreatitis, alcoholic, acute 09/08/7866   Smoker     Past Surgical History:  Procedure Laterality Date   CARPAL TUNNEL RELEASE Right 08/22/2021   Procedure: CARPAL TUNNEL RELEASE;  Surgeon: Mordecai Rasmussen, MD;  Location: [facility];  Service: Orthopedics;  Laterality: Right;   CATARACT EXTRACTION W/PHACO Right 11/24/2019   Procedure: CATARACT EXTRACTION PHACO AND INTRAOCULAR LENS PLACEMENT (IOC);  Surgeon: [name], MD;  Location: [facility];  Service: Ophthalmology;  Laterality: Right;  CDE: 4.96   CATARACT EXTRACTION W/PHACO Left 07/26/2020   Procedure: CATARACT EXTRACTION PHACO AND INTRAOCULAR LENS PLACEMENT (IOC);  Surgeon: [name], MD;  Location: [facility];  Service: Ophthalmology;  Laterality: Left;  CDE: 6.94   COLONOSCOPY   01/27/2005   EHM:[id number] hemorrhoids, otherwise normal rectum/Normal colon, marginal prep made the exam more difficult   ESOPHAGOGASTRODUODENOSCOPY   10/12/2004    EZM:OQHUTM esophagus/Focal area of submucosal petechial hemorrhage in the fundus (likely a trauma-induced phenomenon secondary to vomiting), otherwise normal   LAPAROSCOPIC GASTROTOMY W/ REPAIR OF ULCER     PAROTIDECTOMY Left 07/29/2019   Procedure: LEFT PAROTIDECTOMY;  Surgeon: Leta Baptist, MD;  Location: [place];  Service: ENT;  Laterality: Left;   TRIGGER FINGER RELEASE Right 08/22/2021   Procedure: RELEASE TRIGGER FINGER/A-1 PULLEY;  Surgeon: Mordecai Rasmussen, MD;  Location: [facility];  Service: Orthopedics;  Laterality: Right;  Right long and ring finger trigger release    No Known Allergies   Physical Exam: General: The patient is alert and oriented x3 in no acute distress.  Dermatology: Skin is warm, dry and supple bilateral lower extremities. Negative for open lesions or macerations.  Vascular: Palpable pedal pulses bilaterally. Capillary refill within normal limits.  Negative for any significant edema or erythema  Neurological: Light touch and protective threshold grossly intact  Musculoskeletal Exam: Hallux valgus deformity noted right foot.  There is some tenderness with palpation and range of motion of the first MTP bilateral  Radiographic Exam B/L feet 10/25/2022:  Normal osseous mineralization.  Moderate degenerative changes noted to the first MTP bilateral with increased intermetatarsal and hallux abductus angle to the right foot  Assessment: 1.  Hallux limitus/DJD/capsulitis first MTP bilateral   Plan of Care:  1. Patient evaluated. X-Rays reviewed.  2.  Injection of 0.5 cc Celestone Soluspan injected in the first MTP bilateral 3.  Recommend good supportive shoes and sneakers that do not irritate the great toe 4.  Advised against going barefoot 5.  Continue OTC Tylenol arthritis as needed 6.  Return to clinic as needed      Edrick Kins, DPM [facility]  Dr. Edrick Kins, DPM    [address], [place] [address]                Office [phone]  Fax [phone]

## 2022-11-28 DIAGNOSIS — Z720 Tobacco use: Secondary | ICD-10-CM | POA: Diagnosis not present

## 2022-11-28 DIAGNOSIS — I129 Hypertensive chronic kidney disease with stage 1 through stage 4 chronic kidney disease, or unspecified chronic kidney disease: Secondary | ICD-10-CM | POA: Diagnosis not present

## 2022-11-28 DIAGNOSIS — N183 Chronic kidney disease, stage 3 unspecified: Secondary | ICD-10-CM | POA: Diagnosis not present

## 2022-11-28 DIAGNOSIS — E872 Acidosis, unspecified: Secondary | ICD-10-CM | POA: Diagnosis not present

## 2022-11-28 DIAGNOSIS — R399 Unspecified symptoms and signs involving the genitourinary system: Secondary | ICD-10-CM | POA: Diagnosis not present

## 2022-11-28 DIAGNOSIS — R809 Proteinuria, unspecified: Secondary | ICD-10-CM | POA: Diagnosis not present

## 2022-11-28 DIAGNOSIS — E1122 Type 2 diabetes mellitus with diabetic chronic kidney disease: Secondary | ICD-10-CM | POA: Diagnosis not present

## 2022-11-30 ENCOUNTER — Encounter: Payer: Self-pay | Admitting: Radiology

## 2022-12-12 DIAGNOSIS — R009 Unspecified abnormalities of heart beat: Secondary | ICD-10-CM | POA: Diagnosis not present

## 2022-12-12 DIAGNOSIS — R42 Dizziness and giddiness: Secondary | ICD-10-CM | POA: Diagnosis not present

## 2022-12-12 DIAGNOSIS — R008 Other abnormalities of heart beat: Secondary | ICD-10-CM | POA: Diagnosis not present

## 2022-12-12 DIAGNOSIS — Z87891 Personal history of nicotine dependence: Secondary | ICD-10-CM | POA: Diagnosis not present

## 2022-12-12 DIAGNOSIS — I350 Nonrheumatic aortic (valve) stenosis: Secondary | ICD-10-CM | POA: Diagnosis not present

## 2022-12-19 ENCOUNTER — Encounter (HOSPITAL_BASED_OUTPATIENT_CLINIC_OR_DEPARTMENT_OTHER): Payer: Self-pay | Admitting: Pulmonary Disease

## 2022-12-19 ENCOUNTER — Other Ambulatory Visit (HOSPITAL_BASED_OUTPATIENT_CLINIC_OR_DEPARTMENT_OTHER): Payer: Self-pay

## 2022-12-19 ENCOUNTER — Ambulatory Visit (HOSPITAL_BASED_OUTPATIENT_CLINIC_OR_DEPARTMENT_OTHER): Payer: No Typology Code available for payment source | Admitting: Pulmonary Disease

## 2022-12-19 ENCOUNTER — Other Ambulatory Visit: Payer: Self-pay | Admitting: Orthopedic Surgery

## 2022-12-19 ENCOUNTER — Other Ambulatory Visit: Payer: Self-pay | Admitting: Internal Medicine

## 2022-12-19 VITALS — BP 128/90 | HR 138 | Ht 68.5 in | Wt 169.4 lb

## 2022-12-19 DIAGNOSIS — Z720 Tobacco use: Secondary | ICD-10-CM

## 2022-12-19 DIAGNOSIS — I471 Supraventricular tachycardia, unspecified: Secondary | ICD-10-CM

## 2022-12-19 DIAGNOSIS — J432 Centrilobular emphysema: Secondary | ICD-10-CM | POA: Diagnosis not present

## 2022-12-19 MED ORDER — METOPROLOL TARTRATE 25 MG PO TABS
25.0000 mg | ORAL_TABLET | Freq: Two times a day (BID) | ORAL | 0 refills | Status: DC
  Filled 2022-12-19: qty 60, 30d supply, fill #0

## 2022-12-19 NOTE — Patient Instructions (Signed)
SVT  Hemodynamically stable. Reports palpitations and mild shortness of breath. Likely secondary to non-adherence to medication. --PICK UP your METOPROLOL in the pharmacy in this building and take the first pill now --If you experience worsening symptoms including palpitations, chest pain, shortness of breath, dizziness, pass out, visual changes, GO to the ED --I have offered monitoring vitals after taking your medication in clinic. However he prefers to pick up medication and go home. We discussed the risks and discussed monitoring for red flags as noted above --PLEASE CALL and SCHEDULE CARDIOLOGY appointment for further refills of your medication  Emphysema/COPD GOLD Class B - symptomatic --CONTINUE Anoro ONE puff ONCE a day --Declined Pulmonary Rehab. Recommend reconsidering in the future

## 2022-12-19 NOTE — Progress Notes (Signed)
Subjective:   PATIENT ID: Dylan Cline GENDER: male DOB: [birth year], MRN: [id number]   HPI  Chief Complaint  Patient presents with   Follow-up    Feeling fine   [name] is a 72 year old male current smoker with emphysema, HTN, DM2, hx alcoholism and pancreatitis who presents for follow-up   Synopsis: At baseline he is an active smoker, smoking 1 ppd since he was a teenager. Has shortness of breath with moderate exertion but denies limitation in activity. On initial consult in 10/2018 he had a CT Chest completed on 06/02/18 that demonstrated multiple pulmonary nodules with the largest measuring 77mm. Follow-up lung screen with unchanged including RUL nodule ~37mm since 11/2019 (16 months ago). He reports long standing shortness of breath even before smoking. He started smoking at 72 years old. Denies cough or wheezing. He does not routinely exercise. He is limited by hip pain.   07/04/21 Since our last visit, he has tried Spiriva and may have improved on it. Minimal cough. Shortness of breath with activity. Never has had wheezing. He started a part-time job at [employer]. He will notice palpitations and dizziness with sudden movements including stooping to standing. He drinks only two glasses a day. He smokes 1 ppd daily. He is still not very active and has not returned his call to Pulmonary rehab.  01/04/22 He reports compliance with Spiriva two puffs daily. Sometimes will need an extra puff every other week. Denies coughing and wheezing. Has cut down to 1/2 pack down. Does not smoke with his co-workers. Has not be active at baseline. Denies any exacerbations since our last visit.  04/12/22 He is compliant Anoro. Decreased smoking to <1ppd. Reports shortness of breath with activity. 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 Not active at baseline due to hot weather. Denies coughing or wheezing.  12/19/22 He reports using Anoro every other day. Still smoking 1-1.5 ppd daily. Reports shortness of  breath with activity. Denies coughing or wheezing. Denies nocturnal awakenings or symptoms. Has considered quitting due to the cost. Has used nicotine gum in the past with success for 6-7 months when he stopped using cigarettes but he relapsed. He has palpitations which he believes started before this clinic visit. Has some associated shortness of breath. Denies dizziness, recent syncope, chest pain.  Social History: Active smoker.  40-pack-year history. 1-1.5 ppd daily  Past Medical History:  Diagnosis Date   Acute type 2 diabetes mellitus with manifestations (HCC)    no meds currently   CRI (chronic renal insufficiency), stage 3 (moderate) (HCC)    GERD (gastroesophageal reflux disease)    Hypertension    Mass of left parotid gland    Pancreatitis    Pancreatitis, alcoholic, acute 123456   Pancreatitis, alcoholic, acute 123456   Smoker      Family History  Problem Relation Age of Onset   Diabetes Mother    Diabetes Father    Heart attack Maternal Grandfather    Cancer Paternal Grandmother    Heart attack Maternal Uncle    Cancer Maternal Aunt    Cancer Maternal Uncle    Colon cancer Neg Hx    Pancreatic cancer Neg Hx    Stomach cancer Neg Hx      Social History   Occupational History   Not on file  Tobacco Use   Smoking status: Every Day    Packs/day: 1.00    Years: 40.00    Additional pack years: 0.00  Total pack years: 40.00    Types: Cigarettes    Start date: 12/01/1970   Smokeless tobacco: Never   Tobacco comments:    Smokes a little less than 1 pack per day [facility] 04/12/22  Vaping Use   Vaping Use: Never used  Substance and Sexual Activity   Alcohol use: Yes    Comment: 10/11/21 drinks a bottle of wine a day   Drug use: Yes    Types: Marijuana    Comment: occasionally to stimulate appetite   Sexual activity: Not on file    No Known Allergies   Outpatient Medications Prior to Visit  Medication Sig Dispense Refill   amLODipine (NORVASC) 10 MG tablet  Take 10 mg by mouth daily.     atorvastatin (LIPITOR) 40 MG tablet Take 40 mg by mouth daily.     Cholecalciferol (VITAMIN D) 125 MCG (5000 UT) CAPS Take 5,000 Units by mouth daily.     cyclobenzaprine (FLEXERIL) 10 MG tablet Take 1 tablet (10 mg total) by mouth 2 (two) times daily as needed for muscle spasms. 20 tablet 0   diclofenac Sodium (VOLTAREN) 1 % GEL Apply 2 g topically daily as needed (arthritis pain).     JANUVIA 50 MG tablet Take 50 mg by mouth daily.     LEVEMIR FLEXTOUCH 100 UNIT/ML Pen Inject 25 Units into the skin at bedtime.      tamsulosin (FLOMAX) 0.4 MG CAPS capsule Take 0.8 mg by mouth at bedtime.     umeclidinium-vilanterol (ANORO ELLIPTA) 62.5-25 MCG/ACT AEPB Inhale 1 puff into the lungs daily. 60 each 11   vitamin B-12 (CYANOCOBALAMIN) 1000 MCG tablet Take 1,000 mcg by mouth daily.     metoprolol tartrate (LOPRESSOR) 25 MG tablet Take 1 tablet by mouth twice daily 60 tablet 0   pantoprazole (PROTONIX) 40 MG tablet Take 1 tablet (40 mg total) by mouth daily. 30 tablet 11   hydroxypropyl methylcellulose / hypromellose (ISOPTO TEARS / GONIOVISC) 2.5 % ophthalmic solution Place 1 drop into both eyes as needed for dry eyes. (Patient not taking: Reported on 12/19/2022)     Multiple Vitamin (MULTIVITAMIN WITH MINERALS) TABS tablet Take 1 tablet by mouth daily. (Patient not taking: Reported on 12/19/2022)     predniSONE (STERAPRED UNI-PAK 21 TAB) 10 MG (21) TBPK tablet 10 mg DS 12 as directed (Patient not taking: Reported on 12/19/2022) 48 tablet 0   No facility-administered medications prior to visit.    Review of Systems  Constitutional:  Negative for chills, diaphoresis, fever, malaise/fatigue and weight loss.  HENT:  Negative for congestion.   Respiratory:  Positive for shortness of breath. Negative for cough, hemoptysis, sputum production and wheezing.   Cardiovascular:  Positive for palpitations. Negative for chest pain and leg swelling.    Objective:   Vitals:    12/19/22 0911  BP: (!) 128/90  Pulse: (!) 138  SpO2: 100%  Weight: 169 lb 6.4 oz (76.8 kg)  Height: 5' 8.5" (1.74 m)   SpO2: 100 % O2 Device: None (Room air)  Physical Exam: General: Well-appearing, no acute distress HENT: [place], AT Eyes: EOMI, no scleral icterus Respiratory: Clear to auscultation bilaterally.  No crackles, wheezing or rales Cardiovascular: RRR, -M/R/G, no JVD Extremities:-Edema,-tenderness Neuro: AAO x4, CNII-XII grossly intact Psych: Normal mood, normal affect  Data reviewed  Imaging: CT Chest 06/12/2018 [facility] (report only, no imaging available) - "Mildly enlarged AP window lymph node measuring 1.4 x 1.2 cm (image 37).Marland KitchenMarland KitchenMild predominant paraseptal and centrilobular emphysema. There is an  8 mm right upper lobe nodule adjacent to the fissure, 6 mm anterior right upper lobe nodule, 6 mm left lower lobe nodule and 6 mm left lower lobe nodule (image 267). Impression: Multiple pulmonary nodules, the largest measuring 8 mm.  CT abdomen pelvis 05/24/2013-lower lobes with minimal atelectasis in the bases CT Chest Lung Screen 03/09/21 - Stable lung nodules with largest 7.2 mm. Paraseptal and centrilobular emphysema.  PFT: 05/19/2020 FVC 3.14 (86%) FEV1 2.61 (96%) ratio 73 TLC 86% DLCO 83%.  No significant BD Interpretation: Normal PFTs  04/12/22 FVC 1.83 (44%) FEV1 0.84 (28%) Ratio 80  TLC 82% DLCO 59% Interpretation: Reduced FVC and FEV1. No obstruction and moderately reduced gas exchange  Labs: Absolute eos 07/25/19-100    Assessment & Plan:  72 year old male current smoker with emphysema, HTN, SVT, aortic regurgitation, DM2, HLD who presents for follow-up for COPD. We discussed clinical course and management of COPD including smoking cessation, bronchodilator regimen and action plan for exacerbation. On clinical exam found to be tachycardic. EKG obtained and personally reviewed by me with findings consistent with SVT and hemodynamically stable. Med review with last  metoprolol refill on 10/12/22 for one month supply. Patient has been without rate-controlling meds for >30 days and has had more episodes of palpitations usually associated with mild dizziness. Denies syncope. SVT plan noted below. Patient and wife declined ED visit and declined office monitoring of vitals. They expressed understanding of risk of going home and warned to present to ED if symptoms persisted or worsened.  SVT  Hemodynamically stable. Reports palpitations and mild shortness of breath. Likely secondary to non-adherence to medication. --PICK UP your METOPROLOL in the pharmacy in this building and take the first pill now --If you experience worsening symptoms including palpitations, chest pain, shortness of breath, dizziness, pass out, visual changes, GO to the ED --I have offered monitoring vitals after taking your medication in clinic. However he prefers to pick up medication and go home. We discussed the risks and discussed monitoring for red flags as noted above --PLEASE CALL and SCHEDULE CARDIOLOGY appointment for further refills of your medication  Emphysema/COPD GOLD Class B - symptomatic --CONTINUE Anoro ONE puff ONCE a day --Declined Pulmonary Rehab. Recommend reconsidering in the future  Tobacco abuse Patient is an active smoker. We discussed smoking cessation for 5 minutes. We discussed triggers and stressors and ways to deal with them. We discussed barriers to continued smoking and benefits of smoking cessation. Provided patient with information cessation techniques and interventions including [place] quitline.  Multiple lung nodules - stable Continue annual CT lung screen. Due now. Last scan 03/09/21  Health Maintenance  There is no immunization history on file for this patient. CT lung screen - enrolled  Orders Placed This Encounter  Procedures   EKG 12-Lead   Meds ordered this encounter  Medications   metoprolol tartrate (LOPRESSOR) 25 MG tablet    Sig: Take 1 tablet  (25 mg total) by mouth 2 (two) times daily.    Dispense:  60 tablet    Refill:  0    Please schedule CARDIOLOGY appointment for further refills.   Return in about 3 months (around 03/21/2023) for routine follow-up, with Dr. Loanne Drilling.   I have spent a total time of 60-minutes on the day of the appointment including chart review, data review, collecting history, coordinating care and discussing medical diagnosis and plan with the patient/family. Past medical history, allergies, medications were reviewed. Pertinent imaging, labs and tests included in this note  have been reviewed and interpreted independently by me.  Dale Ribeiro Rodman Pickle, MD [facility] 12/19/2022 7:15 PM

## 2022-12-21 ENCOUNTER — Other Ambulatory Visit (HOSPITAL_COMMUNITY): Payer: Self-pay | Admitting: Family Medicine

## 2022-12-21 DIAGNOSIS — R009 Unspecified abnormalities of heart beat: Secondary | ICD-10-CM | POA: Diagnosis not present

## 2022-12-21 DIAGNOSIS — I350 Nonrheumatic aortic (valve) stenosis: Secondary | ICD-10-CM | POA: Diagnosis not present

## 2022-12-21 DIAGNOSIS — E1122 Type 2 diabetes mellitus with diabetic chronic kidney disease: Secondary | ICD-10-CM | POA: Diagnosis not present

## 2022-12-21 DIAGNOSIS — E782 Mixed hyperlipidemia: Secondary | ICD-10-CM | POA: Diagnosis not present

## 2022-12-21 DIAGNOSIS — R42 Dizziness and giddiness: Secondary | ICD-10-CM | POA: Diagnosis not present

## 2022-12-21 DIAGNOSIS — D631 Anemia in chronic kidney disease: Secondary | ICD-10-CM | POA: Diagnosis not present

## 2022-12-21 DIAGNOSIS — I129 Hypertensive chronic kidney disease with stage 1 through stage 4 chronic kidney disease, or unspecified chronic kidney disease: Secondary | ICD-10-CM | POA: Diagnosis not present

## 2022-12-21 DIAGNOSIS — R911 Solitary pulmonary nodule: Secondary | ICD-10-CM

## 2022-12-21 DIAGNOSIS — N184 Chronic kidney disease, stage 4 (severe): Secondary | ICD-10-CM | POA: Diagnosis not present

## 2022-12-21 DIAGNOSIS — E1140 Type 2 diabetes mellitus with diabetic neuropathy, unspecified: Secondary | ICD-10-CM | POA: Diagnosis not present

## 2022-12-21 DIAGNOSIS — I10 Essential (primary) hypertension: Secondary | ICD-10-CM | POA: Diagnosis not present

## 2022-12-21 DIAGNOSIS — R008 Other abnormalities of heart beat: Secondary | ICD-10-CM | POA: Diagnosis not present

## 2022-12-21 DIAGNOSIS — G894 Chronic pain syndrome: Secondary | ICD-10-CM | POA: Diagnosis not present

## 2022-12-26 ENCOUNTER — Ambulatory Visit: Payer: No Typology Code available for payment source | Admitting: Student

## 2022-12-26 ENCOUNTER — Ambulatory Visit (INDEPENDENT_AMBULATORY_CARE_PROVIDER_SITE_OTHER): Payer: No Typology Code available for payment source | Admitting: Podiatry

## 2022-12-26 DIAGNOSIS — E119 Type 2 diabetes mellitus without complications: Secondary | ICD-10-CM

## 2022-12-26 NOTE — Progress Notes (Signed)
Chief Complaint  Patient presents with   Foot Pain    Patient came in today for a right foot capsulitis follow-up, patient denies any pain, Diabetic, A1c-6.4 BG- 120,     HPI: 72 y.o. male presenting today for follow-up evaluation of bilateral great toe pain.  Patient states that he continues to work and wear steel toed boots.  He says that since the injections he is doing much better.  Presenting for further treatment and evaluation  Past Medical History:  Diagnosis Date   Acute type 2 diabetes mellitus with manifestations ([place])    no meds currently   CRI (chronic renal insufficiency), stage 3 (moderate) (HCC)    GERD (gastroesophageal reflux disease)    Hypertension    Mass of left parotid gland    Pancreatitis    Pancreatitis, alcoholic, acute 123456   Pancreatitis, alcoholic, acute 123456   Smoker     Past Surgical History:  Procedure Laterality Date   CARPAL TUNNEL RELEASE Right 08/22/2021   Procedure: CARPAL TUNNEL RELEASE;  Surgeon: Mordecai Rasmussen, MD;  Location: [facility];  Service: Orthopedics;  Laterality: Right;   CATARACT EXTRACTION W/PHACO Right 11/24/2019   Procedure: CATARACT EXTRACTION PHACO AND INTRAOCULAR LENS PLACEMENT (IOC);  Surgeon: [name], MD;  Location: [facility];  Service: Ophthalmology;  Laterality: Right;  CDE: 4.96   CATARACT EXTRACTION W/PHACO Left 07/26/2020   Procedure: CATARACT EXTRACTION PHACO AND INTRAOCULAR LENS PLACEMENT (IOC);  Surgeon: [name], MD;  Location: [facility];  Service: Ophthalmology;  Laterality: Left;  CDE: 6.94   COLONOSCOPY   01/27/2005   [id number] hemorrhoids, otherwise normal rectum/Normal colon, marginal prep made the exam more difficult   ESOPHAGOGASTRODUODENOSCOPY   10/12/2004   [id number] esophagus/Focal area of submucosal petechial hemorrhage in the fundus (likely a trauma-induced phenomenon secondary to vomiting), otherwise normal   LAPAROSCOPIC GASTROTOMY W/ REPAIR OF ULCER     PAROTIDECTOMY Left  07/29/2019   Procedure: LEFT PAROTIDECTOMY;  Surgeon: Leta Baptist, MD;  Location: [facility];  Service: ENT;  Laterality: Left;   TRIGGER FINGER RELEASE Right 08/22/2021   Procedure: RELEASE TRIGGER FINGER/A-1 PULLEY;  Surgeon: Mordecai Rasmussen, MD;  Location: [facility];  Service: Orthopedics;  Laterality: Right;  Right long and ring finger trigger release    No Known Allergies   Physical Exam: General: The patient is alert and oriented x3 in no acute distress.  Dermatology: Skin is warm, dry and supple bilateral lower extremities. Negative for open lesions or macerations.  Vascular: Palpable pedal pulses bilaterally. Capillary refill within normal limits.  Negative for any significant edema or erythema  Neurological: Light touch and protective threshold grossly intact  Musculoskeletal Exam: Hallux valgus deformity noted right foot.  Negative for any appreciable tenderness with palpation and range of motion of the first MTP bilateral  Radiographic Exam B/L feet 10/25/2022:  Normal osseous mineralization.  Moderate degenerative changes noted to the first MTP bilateral with increased intermetatarsal and hallux abductus angle to the right foot  Assessment: 1.  Hallux limitus/DJD/capsulitis first MTP bilateral  -Patient evaluated.  Comprehensive diabetic foot exam performed today -Overall the patient is doing very well and has no pain or tenderness to the great toe joints -Continue wearing good supportive shoes and sneakers -Return to as needed      Edrick Kins, DPM [facility]  Dr. Edrick Kins, DPM    [address]  [place], [place] [address]                Office [phone]  Fax [phone]

## 2022-12-26 NOTE — Progress Notes (Deleted)
Cardiology Office Note:    Date:  12/26/2022  ID:  SETHE GALYEN, DOB [birth year], MRN [id number]  History of Present Illness:    Dylan Cline is a 72 y.o. male with past medical history of HTN, Type II DM, HTN, COPD, tobacco use, aortic regurgitation, pancreatitis and palpitations who presents to the office today for annual follow-up.  He was last examined by Dr. Harl Bowie in 11/2021 and reported occasional palpitations but only 1-2 episodes per month. It was previously recommended to start Lopressor but had not yet started the medication, therefore was recommend to start Lopressor 25 mg twice daily as prescribed at the time of his last office visit.  He was evaluated Pulmonology last week and was noted to be tachycardic with heart rate in the 140's. He reported being without his Metoprolol and this was sent in.  ROS: ***  Studies Reviewed:    EKG:  ***  Echocardiogram: 09/2021 IMPRESSIONS     1. Left ventricular ejection fraction, by estimation, is 55 to 60%. The  left ventricle has normal function. The left ventricle has no regional  wall motion abnormalities. There is severe left ventricular hypertrophy.  Left ventricular diastolic parameters   are consistent with Grade I diastolic dysfunction (impaired relaxation).   2. Right ventricular systolic function is normal. The right ventricular  size is normal. There is normal pulmonary artery systolic pressure.   3. Left atrial size was mildly dilated.   4. Right atrial size was mild to moderately dilated.   5. The mitral valve is normal in structure. Trivial mitral valve  regurgitation.   6. The aortic valve is tricuspid. Aortic valve regurgitation is mild to  moderate. Aortic valve sclerosis is present, with no evidence of aortic  valve stenosis.   7. The inferior vena cava is dilated in size with <50% respiratory  variability, suggesting right atrial pressure of 15 mmHg.    Event Monitor: 10/2021 4 day monitor Occasional  supraventricular ectopy in the form of isolated PACs, rare couplets and triplets. 59 runs of SVT longest lasting 1 hr 26 min. Rare ventricular ectopy in the form of isolated PVCs, couplets, triplets. 9 runs of NSVT longest 11 beats No reported symptoms     Patch Wear Time:  13 days and 16 hours (2022-12-21T22:59:50-0500 to 2023-01-04T15:32:52-0500)   Patient had a min HR of 45 bpm, max HR of 231 bpm, and avg HR of 82 bpm. Predominant underlying rhythm was Ectopic Atrial Rhythm. 9 Ventricular Tachycardia runs occurred, the run with the fastest interval lasting 10 beats with a max rate of 231 bpm, the  longest lasting 11 beats with an avg rate of 150 bpm. 59 Supraventricular Tachycardia runs occurred, the run with the fastest interval lasting 5 mins 15 secs with a max rate of 207 bpm, the longest lasting 1 hour 26 mins with an avg rate of 156 bpm.  Supraventricular Tachycardia was present at de-activation of device. Ectopic Atrial Rhythm was present. Supraventricular Tachycardia was detected within +/- 45 seconds of symptomatic patient event(s). Isolated SVEs were occasional (1.8%, [id number]), SVE  Couplets were rare (<1.0%, 792), and SVE Triplets were rare (<1.0%, 119). Isolated VEs were rare (<1.0%, 7664), VE Couplets were rare (<1.0%, 92), and VE Triplets were rare (<1.0%, 12). Ventricular Bigeminy and Trigeminy were present. MD notification  criteria for Supraventricular Tachycardia met - report posted prior to notification per account request (AB).  Risk Assessment/Calculations:   {Does this patient have ATRIAL FIBRILLATION?:[phone]} No BP recorded.  {Refresh  Note OR Click here to enter BP  :1}***        Physical Exam:   VS:  There were no vitals taken for this visit.   Wt Readings from Last 3 Encounters:  12/19/22 169 lb 6.4 oz (76.8 kg)  04/12/22 167 lb (75.8 kg)  04/07/22 169 lb (76.7 kg)     GEN: Well nourished, well developed in no acute distress NECK: No JVD; No carotid  bruits CARDIAC: ***RRR, no murmurs, rubs, gallops RESPIRATORY:  Clear to auscultation without rales, wheezing or rhonchi  ABDOMEN: Soft, non-tender, non-distended EXTREMITIES:  No edema; No deformity   ASSESSMENT AND PLAN:     1. Palpitations - ***  2. Aortic Regurgitation - ***  3. HTN - ***  Signed, Erma Heritage, PA-C

## 2023-01-10 ENCOUNTER — Ambulatory Visit: Payer: No Typology Code available for payment source | Admitting: Student

## 2023-01-24 DIAGNOSIS — Z1211 Encounter for screening for malignant neoplasm of colon: Secondary | ICD-10-CM | POA: Diagnosis not present

## 2023-02-16 ENCOUNTER — Encounter (HOSPITAL_COMMUNITY): Payer: Self-pay

## 2023-02-16 ENCOUNTER — Ambulatory Visit (HOSPITAL_COMMUNITY): Payer: No Typology Code available for payment source

## 2023-03-29 DIAGNOSIS — E1122 Type 2 diabetes mellitus with diabetic chronic kidney disease: Secondary | ICD-10-CM | POA: Diagnosis not present

## 2023-03-29 DIAGNOSIS — D539 Nutritional anemia, unspecified: Secondary | ICD-10-CM | POA: Diagnosis not present

## 2023-03-29 DIAGNOSIS — R008 Other abnormalities of heart beat: Secondary | ICD-10-CM | POA: Diagnosis not present

## 2023-03-29 DIAGNOSIS — R42 Dizziness and giddiness: Secondary | ICD-10-CM | POA: Diagnosis not present

## 2023-04-02 ENCOUNTER — Other Ambulatory Visit (HOSPITAL_COMMUNITY): Payer: Self-pay | Admitting: Family Medicine

## 2023-04-02 DIAGNOSIS — G894 Chronic pain syndrome: Secondary | ICD-10-CM | POA: Diagnosis not present

## 2023-04-02 DIAGNOSIS — I10 Essential (primary) hypertension: Secondary | ICD-10-CM | POA: Diagnosis not present

## 2023-04-02 DIAGNOSIS — R42 Dizziness and giddiness: Secondary | ICD-10-CM | POA: Diagnosis not present

## 2023-04-02 DIAGNOSIS — D539 Nutritional anemia, unspecified: Secondary | ICD-10-CM | POA: Diagnosis not present

## 2023-04-02 DIAGNOSIS — R911 Solitary pulmonary nodule: Secondary | ICD-10-CM

## 2023-04-02 DIAGNOSIS — D631 Anemia in chronic kidney disease: Secondary | ICD-10-CM | POA: Diagnosis not present

## 2023-04-02 DIAGNOSIS — J439 Emphysema, unspecified: Secondary | ICD-10-CM | POA: Diagnosis not present

## 2023-04-02 DIAGNOSIS — F1010 Alcohol abuse, uncomplicated: Secondary | ICD-10-CM | POA: Diagnosis not present

## 2023-04-02 DIAGNOSIS — I350 Nonrheumatic aortic (valve) stenosis: Secondary | ICD-10-CM | POA: Diagnosis not present

## 2023-04-02 DIAGNOSIS — N184 Chronic kidney disease, stage 4 (severe): Secondary | ICD-10-CM | POA: Diagnosis not present

## 2023-04-02 DIAGNOSIS — N401 Enlarged prostate with lower urinary tract symptoms: Secondary | ICD-10-CM | POA: Diagnosis not present

## 2023-04-02 DIAGNOSIS — E782 Mixed hyperlipidemia: Secondary | ICD-10-CM | POA: Diagnosis not present

## 2023-04-02 DIAGNOSIS — E1122 Type 2 diabetes mellitus with diabetic chronic kidney disease: Secondary | ICD-10-CM | POA: Diagnosis not present

## 2023-04-10 ENCOUNTER — Other Ambulatory Visit: Payer: Self-pay | Admitting: Pulmonary Disease

## 2023-04-20 ENCOUNTER — Ambulatory Visit (HOSPITAL_COMMUNITY): Admission: RE | Admit: 2023-04-20 | Payer: No Typology Code available for payment source | Source: Ambulatory Visit

## 2023-04-20 ENCOUNTER — Encounter (HOSPITAL_COMMUNITY): Payer: Self-pay

## 2023-06-27 ENCOUNTER — Encounter (HOSPITAL_BASED_OUTPATIENT_CLINIC_OR_DEPARTMENT_OTHER): Payer: Self-pay | Admitting: Pulmonary Disease

## 2023-06-27 ENCOUNTER — Ambulatory Visit (HOSPITAL_BASED_OUTPATIENT_CLINIC_OR_DEPARTMENT_OTHER): Payer: No Typology Code available for payment source | Admitting: Pulmonary Disease

## 2023-06-27 VITALS — BP 128/72 | HR 62 | Resp 16 | Ht 68.5 in | Wt 173.7 lb

## 2023-06-27 DIAGNOSIS — J432 Centrilobular emphysema: Secondary | ICD-10-CM | POA: Diagnosis not present

## 2023-06-27 DIAGNOSIS — Z720 Tobacco use: Secondary | ICD-10-CM | POA: Diagnosis not present

## 2023-06-27 MED ORDER — ANORO ELLIPTA 62.5-25 MCG/ACT IN AEPB: 1.0000 | INHALATION_SPRAY | Freq: Every day | RESPIRATORY_TRACT | 2 refills | Status: DC

## 2023-06-27 NOTE — Patient Instructions (Signed)
  Emphysema/COPD GOLD Class B - improved symptoms with reduced tobacco use --CONTINUE Anoro ONE puff ONCE a day --Declined Pulmonary Rehab.  --Declined vaccinations including influenza, pneumococcal and RSV. Prefer for PCP to review   Tobacco abuse Patient is an active smoker. Reduced to 1/2 ppd. CONGRATULATIONS! And keep working on cutting back We discussed smoking cessation for 5 minutes. We discussed triggers and stressors and ways to deal with them. We discussed barriers to continued smoking and benefits of smoking cessation. Provided patient with information cessation techniques and interventions including [place] quitline.  Multiple lung nodules - stable Continue annual CT lung screen. Re-referred to lung screen program

## 2023-06-27 NOTE — Progress Notes (Signed)
Subjective:   PATIENT ID: Dylan Cline GENDER: male DOB: 1951/04/09, MRN: [id number]   HPI  Chief Complaint  Patient presents with   Follow-up    Breathing is ok. Its about the same   [name] is a 72 year old male current smoker with emphysema, HTN, DM2, hx alcoholism and pancreatitis who presents for follow-up   Synopsis: At baseline he is an active smoker, smoking 1 ppd since he was a teenager. Has shortness of breath with moderate exertion but denies limitation in activity. On initial consult in 10/2018 he had a CT Chest completed on 06/02/18 that demonstrated multiple pulmonary nodules with the largest measuring 8mm. Follow-up lung screen with unchanged including RUL nodule ~80mm since 11/2019 (16 months ago). He reports long standing shortness of breath even before smoking. He started smoking at 72 years old. Denies cough or wheezing. He does not routinely exercise. He is limited by hip pain.   07/04/21 Since our last visit, he has tried Spiriva and may have improved on it. Minimal cough. Shortness of breath with activity. Never has had wheezing. He started a part-time job at [employer]. He will notice palpitations and dizziness with sudden movements including stooping to standing. He drinks only two glasses a day. He smokes 1 ppd daily. He is still not very active and has not returned his call to Pulmonary rehab.  01/04/22 He reports compliance with Spiriva two puffs daily. Sometimes will need an extra puff every other week. Denies coughing and wheezing. Has cut down to 1/2 pack down. Does not smoke with his co-workers. Has not be active at baseline. Denies any exacerbations since our last visit.  04/12/22 He is compliant Anoro. Decreased smoking to <1ppd. Reports shortness of breath with activity. 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 Not active at baseline due to hot weather. Denies coughing or wheezing.  12/19/22 He reports using Anoro every other day. Still smoking 1-1.5 ppd  daily. Reports shortness of breath with activity. Denies coughing or wheezing. Denies nocturnal awakenings or symptoms. Has considered quitting due to the cost. Has used nicotine gum in the past with success for 6-7 months when he stopped using cigarettes but he relapsed. He has palpitations which he believes started before this clinic visit. Has some associated shortness of breath. Denies dizziness, recent syncope, chest pain.  06/27/23 Since our last visit he has reduced smoking to 1/2 ppd compared to previous 1-1.5 ppd. He denies shortness of breath, cough or wheezing. He can walk >200 ft. No exacerbation since our last visit. He reports his primary care provider manages his vaccines and he will be seen in November. Not interested in vaccines. Compliant Anoro.   Social History: Active smoker.  40-pack-year history. 1-1.5 ppd daily  Past Medical History:  Diagnosis Date   Acute type 2 diabetes mellitus with manifestations (HCC)    no meds currently   CRI (chronic renal insufficiency), stage 3 (moderate) (HCC)    GERD (gastroesophageal reflux disease)    Hypertension    Mass of left parotid gland    Pancreatitis    Pancreatitis, alcoholic, acute 08/10/2014   Pancreatitis, alcoholic, acute 08/10/2014   Smoker      Family History  Problem Relation Age of Onset   Diabetes Mother    Diabetes Father    Heart attack Maternal Grandfather    Cancer Paternal Grandmother    Heart attack Maternal Uncle    Cancer Maternal Aunt    Cancer Maternal Uncle  Colon cancer Neg Hx    Pancreatic cancer Neg Hx    Stomach cancer Neg Hx      Social History   Occupational History   Not on file  Tobacco Use   Smoking status: Every Day    Current packs/day: 1.00    Average packs/day: 1 pack/day for 52.6 years (52.6 ttl pk-yrs)    Types: Cigarettes    Start date: 12/01/1970   Smokeless tobacco: Never   Tobacco comments:    Smokes a little less than 1 pack per day [facility] 04/12/22  Vaping Use   Vaping  status: Never Used  Substance and Sexual Activity   Alcohol use: Yes    Comment: 10/11/21 drinks a bottle of wine a day   Drug use: Yes    Types: Marijuana    Comment: occasionally to stimulate appetite   Sexual activity: Not on file    No Known Allergies   Outpatient Medications Prior to Visit  Medication Sig Dispense Refill   amLODipine (NORVASC) 10 MG tablet Take 10 mg by mouth daily.     atorvastatin (LIPITOR) 40 MG tablet Take 40 mg by mouth daily.     Cholecalciferol (VITAMIN D) 125 MCG (5000 UT) CAPS Take 5,000 Units by mouth daily.     cyclobenzaprine (FLEXERIL) 10 MG tablet Take 1 tablet (10 mg total) by mouth 2 (two) times daily as needed for muscle spasms. 20 tablet 0   diclofenac Sodium (VOLTAREN) 1 % GEL Apply 2 g topically daily as needed (arthritis pain).     JANUVIA 50 MG tablet Take 50 mg by mouth daily.     LEVEMIR FLEXTOUCH 100 UNIT/ML Pen Inject 25 Units into the skin at bedtime.      metoprolol tartrate (LOPRESSOR) 25 MG tablet Take 1 tablet (25 mg total) by mouth 2 (two) times daily. 60 tablet 0   pantoprazole (PROTONIX) 40 MG tablet Take 1 tablet by mouth once daily 30 tablet 11   tamsulosin (FLOMAX) 0.4 MG CAPS capsule Take 0.8 mg by mouth at bedtime.     vitamin B-12 (CYANOCOBALAMIN) 1000 MCG tablet Take 1,000 mcg by mouth daily.     ANORO ELLIPTA 62.5-25 MCG/ACT AEPB Inhale 1 puff by mouth once daily 180 each 0   hydroxypropyl methylcellulose / hypromellose (ISOPTO TEARS / GONIOVISC) 2.5 % ophthalmic solution Place 1 drop into both eyes as needed for dry eyes. (Patient not taking: Reported on 12/19/2022)     No facility-administered medications prior to visit.    Review of Systems  Constitutional:  Negative for chills, diaphoresis, fever, malaise/fatigue and weight loss.  HENT:  Negative for congestion.   Respiratory:  Negative for cough, hemoptysis, sputum production, shortness of breath and wheezing.   Cardiovascular:  Negative for chest pain, palpitations  and leg swelling.    Objective:   Vitals:   06/27/23 1447  BP: 128/72  Pulse: 62  Resp: 16  SpO2: 97%  Weight: 173 lb 11.2 oz (78.8 kg)  Height: 5' 8.5" (1.74 m)   SpO2: 97 %  Physical Exam: General: Well-appearing, no acute distress HENT: [place], AT Eyes: EOMI, no scleral icterus Respiratory: Clear to auscultation bilaterally.  No crackles, wheezing or rales Cardiovascular: RRR, -M/R/G, no JVD Extremities:-Edema,-tenderness Neuro: AAO x4, CNII-XII grossly intact Psych: Normal mood, normal affect  Data reviewed  Imaging: CT Chest 06/12/2018 [facility] (report only, no imaging available) - "Mildly enlarged AP window lymph node measuring 1.4 x 1.2 cm (image 37).Marland KitchenMarland KitchenMild predominant paraseptal and centrilobular emphysema. There  is an 8 mm right upper lobe nodule adjacent to the fissure, 6 mm anterior right upper lobe nodule, 6 mm left lower lobe nodule and 6 mm left lower lobe nodule (image 267). Impression: Multiple pulmonary nodules, the largest measuring 8 mm.  CT abdomen pelvis 05/24/2013-lower lobes with minimal atelectasis in the bases CT Chest Lung Screen 03/09/21 - Stable lung nodules with largest 7.2 mm. Paraseptal and centrilobular emphysema.  PFT: 05/19/2020 FVC 3.14 (86%) FEV1 2.61 (96%) ratio 73 TLC 86% DLCO 83%.  No significant BD Interpretation: Normal PFTs  04/12/22 FVC 1.83 (44%) FEV1 0.84 (28%) Ratio 80  TLC 82% DLCO 59% Interpretation: Reduced FVC and FEV1. No obstruction and moderately reduced gas exchange  Labs: Absolute eos 07/25/19-100    Assessment & Plan:  72 year old male active smoker with emphysema, HTN, SVT, aortic regurgitation, DM2, HLD who presents for follow-up for COPD. Stable symptoms on LAMA/LABA. Discussed clinical course and management of COPD including bronchodilator regimen, preventive care including vaccinations and action plan for exacerbation. Declined vaccinations today.  Emphysema/COPD GOLD Class B - improved symptoms with reduced tobacco  use --CONTINUE Anoro ONE puff ONCE a day --Declined Pulmonary Rehab.  --Declined vaccinations including influenza, pneumococcal and RSV. Prefer for PCP to review   Tobacco abuse Patient is an active smoker. Reduced to 1/2 ppd. CONGRATULATIONS! And keep working on cutting back We discussed smoking cessation for 5 minutes. We discussed triggers and stressors and ways to deal with them. We discussed barriers to continued smoking and benefits of smoking cessation. Provided patient with information cessation techniques and interventions including [place] quitline.  Multiple lung nodules - stable Continue annual CT lung screen. Re-referred to lung screen program  Health Maintenance  There is no immunization history on file for this patient. CT lung screen - enrolled  Orders Placed This Encounter  Procedures   Ambulatory Referral for Lung Cancer Scre    Referral Priority:   Routine    Referral Type:   Consultation    Referral Reason:   Specialty Services Required    Number of Visits Requested:   1   Meds ordered this encounter  Medications   umeclidinium-vilanterol (ANORO ELLIPTA) 62.5-25 MCG/ACT AEPB    Sig: Inhale 1 puff into the lungs daily.    Dispense:  180 each    Refill:  2   Return in about 6 months (around 12/25/2023).   I have spent a total time of 33-minutes on the day of the appointment including chart review, data review, collecting history, coordinating care and discussing medical diagnosis and plan with the patient/family. Past medical history, allergies, medications were reviewed. Pertinent imaging, labs and tests included in this note have been reviewed and interpreted independently by me.  Aaliyha Mumford Mechele Collin, MD [facility] 06/27/2023 3:21 PM

## 2023-07-02 DIAGNOSIS — E1122 Type 2 diabetes mellitus with diabetic chronic kidney disease: Secondary | ICD-10-CM | POA: Diagnosis not present

## 2023-07-11 DIAGNOSIS — I129 Hypertensive chronic kidney disease with stage 1 through stage 4 chronic kidney disease, or unspecified chronic kidney disease: Secondary | ICD-10-CM | POA: Diagnosis not present

## 2023-07-11 DIAGNOSIS — N183 Chronic kidney disease, stage 3 unspecified: Secondary | ICD-10-CM | POA: Diagnosis not present

## 2023-07-11 DIAGNOSIS — E872 Acidosis, unspecified: Secondary | ICD-10-CM | POA: Diagnosis not present

## 2023-07-11 DIAGNOSIS — R809 Proteinuria, unspecified: Secondary | ICD-10-CM | POA: Diagnosis not present

## 2023-07-11 DIAGNOSIS — R399 Unspecified symptoms and signs involving the genitourinary system: Secondary | ICD-10-CM | POA: Diagnosis not present

## 2023-07-11 DIAGNOSIS — E1122 Type 2 diabetes mellitus with diabetic chronic kidney disease: Secondary | ICD-10-CM | POA: Diagnosis not present

## 2023-07-11 DIAGNOSIS — Z720 Tobacco use: Secondary | ICD-10-CM | POA: Diagnosis not present

## 2023-07-11 DIAGNOSIS — N184 Chronic kidney disease, stage 4 (severe): Secondary | ICD-10-CM | POA: Diagnosis not present

## 2023-07-13 ENCOUNTER — Other Ambulatory Visit: Payer: Self-pay | Admitting: Nephrology

## 2023-07-13 DIAGNOSIS — N184 Chronic kidney disease, stage 4 (severe): Secondary | ICD-10-CM

## 2023-07-16 ENCOUNTER — Other Ambulatory Visit: Payer: No Typology Code available for payment source

## 2023-07-17 ENCOUNTER — Ambulatory Visit
Admission: RE | Admit: 2023-07-17 | Discharge: 2023-07-17 | Disposition: A | Discharge status: Home / Self Care | Payer: No Typology Code available for payment source | Source: Ambulatory Visit | Attending: Nephrology | Admitting: Nephrology

## 2023-07-17 DIAGNOSIS — N184 Chronic kidney disease, stage 4 (severe): Secondary | ICD-10-CM

## 2023-07-25 ENCOUNTER — Telehealth: Payer: Self-pay | Admitting: Pulmonary Disease

## 2023-07-25 DIAGNOSIS — Z87891 Personal history of nicotine dependence: Secondary | ICD-10-CM

## 2023-07-25 DIAGNOSIS — F17210 Nicotine dependence, cigarettes, uncomplicated: Secondary | ICD-10-CM

## 2023-07-25 DIAGNOSIS — Z122 Encounter for screening for malignant neoplasm of respiratory organs: Secondary | ICD-10-CM

## 2023-07-25 NOTE — Telephone Encounter (Signed)
Spoke with pt's wife and scheduled pt for LDCT at [facility] 08/04/23 4:00.

## 2023-07-25 NOTE — Telephone Encounter (Signed)
Patient's wife is returning call to schedule an appointment for Lung Cancer Screening.

## 2023-08-04 ENCOUNTER — Ambulatory Visit (HOSPITAL_COMMUNITY): Admission: RE | Admit: 2023-08-04 | Payer: No Typology Code available for payment source | Source: Ambulatory Visit

## 2023-08-12 ENCOUNTER — Ambulatory Visit (HOSPITAL_COMMUNITY): Admission: RE | Admit: 2023-08-12 | Payer: No Typology Code available for payment source | Source: Ambulatory Visit

## 2023-08-13 DIAGNOSIS — R42 Dizziness and giddiness: Secondary | ICD-10-CM | POA: Diagnosis not present

## 2023-08-13 DIAGNOSIS — I471 Supraventricular tachycardia, unspecified: Secondary | ICD-10-CM | POA: Diagnosis not present

## 2023-08-13 DIAGNOSIS — D539 Nutritional anemia, unspecified: Secondary | ICD-10-CM | POA: Diagnosis not present

## 2023-08-13 DIAGNOSIS — E1122 Type 2 diabetes mellitus with diabetic chronic kidney disease: Secondary | ICD-10-CM | POA: Diagnosis not present

## 2023-08-15 ENCOUNTER — Other Ambulatory Visit (HOSPITAL_COMMUNITY): Payer: Self-pay | Admitting: Family Medicine

## 2023-08-15 DIAGNOSIS — D631 Anemia in chronic kidney disease: Secondary | ICD-10-CM | POA: Diagnosis not present

## 2023-08-15 DIAGNOSIS — R42 Dizziness and giddiness: Secondary | ICD-10-CM | POA: Diagnosis not present

## 2023-08-15 DIAGNOSIS — E782 Mixed hyperlipidemia: Secondary | ICD-10-CM | POA: Diagnosis not present

## 2023-08-15 DIAGNOSIS — F1010 Alcohol abuse, uncomplicated: Secondary | ICD-10-CM | POA: Diagnosis not present

## 2023-08-15 DIAGNOSIS — D539 Nutritional anemia, unspecified: Secondary | ICD-10-CM | POA: Diagnosis not present

## 2023-08-15 DIAGNOSIS — I350 Nonrheumatic aortic (valve) stenosis: Secondary | ICD-10-CM | POA: Diagnosis not present

## 2023-08-15 DIAGNOSIS — I10 Essential (primary) hypertension: Secondary | ICD-10-CM | POA: Diagnosis not present

## 2023-08-15 DIAGNOSIS — N184 Chronic kidney disease, stage 4 (severe): Secondary | ICD-10-CM | POA: Diagnosis not present

## 2023-08-15 DIAGNOSIS — E1122 Type 2 diabetes mellitus with diabetic chronic kidney disease: Secondary | ICD-10-CM | POA: Diagnosis not present

## 2023-08-15 DIAGNOSIS — R911 Solitary pulmonary nodule: Secondary | ICD-10-CM

## 2023-08-15 DIAGNOSIS — G894 Chronic pain syndrome: Secondary | ICD-10-CM | POA: Diagnosis not present

## 2023-08-15 DIAGNOSIS — N401 Enlarged prostate with lower urinary tract symptoms: Secondary | ICD-10-CM | POA: Diagnosis not present

## 2023-08-15 DIAGNOSIS — I129 Hypertensive chronic kidney disease with stage 1 through stage 4 chronic kidney disease, or unspecified chronic kidney disease: Secondary | ICD-10-CM | POA: Diagnosis not present

## 2023-08-26 ENCOUNTER — Ambulatory Visit (HOSPITAL_COMMUNITY)
Admission: RE | Admit: 2023-08-26 | Discharge: 2023-08-26 | Disposition: A | Discharge status: Home / Self Care | Payer: No Typology Code available for payment source | Source: Ambulatory Visit | Attending: Acute Care | Admitting: Acute Care

## 2023-08-26 DIAGNOSIS — I2510 Atherosclerotic heart disease of native coronary artery without angina pectoris: Secondary | ICD-10-CM | POA: Insufficient documentation

## 2023-08-26 DIAGNOSIS — Z122 Encounter for screening for malignant neoplasm of respiratory organs: Secondary | ICD-10-CM | POA: Diagnosis not present

## 2023-08-26 DIAGNOSIS — F17210 Nicotine dependence, cigarettes, uncomplicated: Secondary | ICD-10-CM | POA: Diagnosis not present

## 2023-08-26 DIAGNOSIS — Z87891 Personal history of nicotine dependence: Secondary | ICD-10-CM | POA: Diagnosis not present

## 2023-08-26 DIAGNOSIS — I700 Atherosclerosis of aorta: Secondary | ICD-10-CM | POA: Insufficient documentation

## 2023-08-26 DIAGNOSIS — J439 Emphysema, unspecified: Secondary | ICD-10-CM | POA: Insufficient documentation

## 2023-10-30 ENCOUNTER — Other Ambulatory Visit: Payer: Self-pay | Admitting: *Deleted

## 2023-10-30 DIAGNOSIS — Z87891 Personal history of nicotine dependence: Secondary | ICD-10-CM

## 2023-10-30 DIAGNOSIS — Z122 Encounter for screening for malignant neoplasm of respiratory organs: Secondary | ICD-10-CM

## 2023-10-30 DIAGNOSIS — F17210 Nicotine dependence, cigarettes, uncomplicated: Secondary | ICD-10-CM

## 2023-11-15 DIAGNOSIS — N184 Chronic kidney disease, stage 4 (severe): Secondary | ICD-10-CM | POA: Diagnosis not present

## 2023-11-28 DIAGNOSIS — Z720 Tobacco use: Secondary | ICD-10-CM | POA: Diagnosis not present

## 2023-11-28 DIAGNOSIS — I129 Hypertensive chronic kidney disease with stage 1 through stage 4 chronic kidney disease, or unspecified chronic kidney disease: Secondary | ICD-10-CM | POA: Diagnosis not present

## 2023-11-28 DIAGNOSIS — E872 Acidosis, unspecified: Secondary | ICD-10-CM | POA: Diagnosis not present

## 2023-11-28 DIAGNOSIS — N184 Chronic kidney disease, stage 4 (severe): Secondary | ICD-10-CM | POA: Diagnosis not present

## 2023-11-28 DIAGNOSIS — E1122 Type 2 diabetes mellitus with diabetic chronic kidney disease: Secondary | ICD-10-CM | POA: Diagnosis not present

## 2023-11-28 DIAGNOSIS — R399 Unspecified symptoms and signs involving the genitourinary system: Secondary | ICD-10-CM | POA: Diagnosis not present

## 2023-11-28 DIAGNOSIS — R809 Proteinuria, unspecified: Secondary | ICD-10-CM | POA: Diagnosis not present

## 2023-12-24 ENCOUNTER — Other Ambulatory Visit: Payer: Self-pay | Admitting: Internal Medicine

## 2024-01-08 ENCOUNTER — Encounter: Payer: Self-pay | Admitting: Cardiology

## 2024-01-08 ENCOUNTER — Ambulatory Visit: Payer: No Typology Code available for payment source | Attending: Cardiology | Admitting: Cardiology

## 2024-01-08 NOTE — Progress Notes (Deleted)
 Clinical Summary Dylan Cline is a 73 y.o.male  seen today as a new patient, last seen in our office in 2017       1. Palpitations - can feel heart racing at times - occurs 1-2 times per month. Can last a few hours at time. +lightheaded, dizzy,SOB - no specific trigger    09/2021  monitor runs of SVT, longest 1 hr 26 min.Short runs NSVT - started lopressor 25mg  bid, but did not stary - ongoing symptoms, about 1-2 times per month.    2. HTN - has not taken meds last few days.    3. Aortic regurgitation - 09/2021 echo mild to mod AI  4. COPD  - followed by pulmonary  Past Medical History:  Diagnosis Date   Acute type 2 diabetes mellitus with manifestations (HCC)    no meds currently   CRI (chronic renal insufficiency), stage 3 (moderate) (HCC)    GERD (gastroesophageal reflux disease)    Hypertension    Mass of left parotid gland    Pancreatitis    Pancreatitis, alcoholic, acute 08/10/2014   Pancreatitis, alcoholic, acute 08/10/2014   Smoker      No Known Allergies   Current Outpatient Medications  Medication Sig Dispense Refill   amLODipine (NORVASC) 10 MG tablet Take 10 mg by mouth daily.     atorvastatin (LIPITOR) 40 MG tablet Take 40 mg by mouth daily.     Cholecalciferol (VITAMIN D) 125 MCG (5000 UT) CAPS Take 5,000 Units by mouth daily.     cyclobenzaprine (FLEXERIL) 10 MG tablet Take 1 tablet (10 mg total) by mouth 2 (two) times daily as needed for muscle spasms. 20 tablet 0   diclofenac Sodium (VOLTAREN) 1 % GEL Apply 2 g topically daily as needed (arthritis pain).     JANUVIA 50 MG tablet Take 50 mg by mouth daily.     LEVEMIR FLEXTOUCH 100 UNIT/ML Pen Inject 25 Units into the skin at bedtime.      metoprolol tartrate (LOPRESSOR) 25 MG tablet Take 1 tablet (25 mg total) by mouth 2 (two) times daily. 60 tablet 0   pantoprazole (PROTONIX) 40 MG tablet Take 1 tablet by mouth once daily 30 tablet 5   tamsulosin (FLOMAX) 0.4 MG CAPS capsule Take 0.8 mg by  mouth at bedtime.     umeclidinium-vilanterol (ANORO ELLIPTA) 62.5-25 MCG/ACT AEPB Inhale 1 puff into the lungs daily. 180 each 2   vitamin B-12 (CYANOCOBALAMIN) 1000 MCG tablet Take 1,000 mcg by mouth daily.     No current facility-administered medications for this visit.     Past Surgical History:  Procedure Laterality Date   CARPAL TUNNEL RELEASE Right 08/22/2021   Procedure: CARPAL TUNNEL RELEASE;  Surgeon: Oliver Barre, MD;  Location: [facility];  Service: Orthopedics;  Laterality: Right;   CATARACT EXTRACTION W/PHACO Right 11/24/2019   Procedure: CATARACT EXTRACTION PHACO AND INTRAOCULAR LENS PLACEMENT (IOC);  Surgeon: Fabio Pierce, MD;  Location: [facility];  Service: Ophthalmology;  Laterality: Right;  CDE: 4.96   CATARACT EXTRACTION W/PHACO Left 07/26/2020   Procedure: CATARACT EXTRACTION PHACO AND INTRAOCULAR LENS PLACEMENT (IOC);  Surgeon: Fabio Pierce, MD;  Location: [facility];  Service: Ophthalmology;  Laterality: Left;  CDE: 6.94   COLONOSCOPY   01/27/2005   [id number] hemorrhoids, otherwise normal rectum/Normal colon, marginal prep made the exam more difficult   ESOPHAGOGASTRODUODENOSCOPY   10/12/2004   [id number] esophagus/Focal area of submucosal petechial hemorrhage in the fundus (likely a trauma-induced phenomenon secondary  to vomiting), otherwise normal   LAPAROSCOPIC GASTROTOMY W/ REPAIR OF ULCER     PAROTIDECTOMY Left 07/29/2019   Procedure: LEFT PAROTIDECTOMY;  Surgeon: Newman Pies, MD;  Location: [facility];  Service: ENT;  Laterality: Left;   TRIGGER FINGER RELEASE Right 08/22/2021   Procedure: RELEASE TRIGGER FINGER/A-1 PULLEY;  Surgeon: Oliver Barre, MD;  Location: [facility];  Service: Orthopedics;  Laterality: Right;  Right long and ring finger trigger release     No Known Allergies    Family History  Problem Relation Age of Onset   Diabetes Mother    Diabetes Father    Heart attack Maternal Grandfather    Cancer Paternal Grandmother    Heart  attack Maternal Uncle    Cancer Maternal Aunt    Cancer Maternal Uncle    Colon cancer Neg Hx    Pancreatic cancer Neg Hx    Stomach cancer Neg Hx      Social History Dylan Cline reports that he has been smoking cigarettes. He started smoking about 53 years ago. He has a 53.1 pack-year smoking history. He has never used smokeless tobacco. Dylan Cline reports current alcohol use.   Review of Systems CONSTITUTIONAL: No weight loss, fever, chills, weakness or fatigue.  HEENT: Eyes: No visual loss, blurred vision, double vision or yellow sclerae.No hearing loss, sneezing, congestion, runny nose or sore throat.  SKIN: No rash or itching.  CARDIOVASCULAR:  RESPIRATORY: No shortness of breath, cough or sputum.  GASTROINTESTINAL: No anorexia, nausea, vomiting or diarrhea. No abdominal pain or blood.  GENITOURINARY: No burning on urination, no polyuria NEUROLOGICAL: No headache, dizziness, syncope, paralysis, ataxia, numbness or tingling in the extremities. No change in bowel or bladder control.  MUSCULOSKELETAL: No muscle, back pain, joint pain or stiffness.  LYMPHATICS: No enlarged nodes. No history of splenectomy.  PSYCHIATRIC: No history of depression or anxiety.  ENDOCRINOLOGIC: No reports of sweating, cold or heat intolerance. No polyuria or polydipsia.  Marland Kitchen   Physical Examination There were no vitals filed for this visit. There were no vitals filed for this visit.  Gen: resting comfortably, no acute distress HEENT: no scleral icterus, pupils equal round and reactive, no palptable cervical adenopathy,  CV Resp: Clear to auscultation bilaterally GI: abdomen is soft, non-tender, non-distended, normal bowel sounds, no hepatosplenomegaly MSK: extremities are warm, no edema.  Skin: warm, no rash Neuro:  no focal deficits Psych: appropriate affect   Diagnostic Studies  03/2016 echo Study Conclusions   - Left ventricle: The cavity size was normal. Wall thickness was    increased in  a pattern of mild LVH. Systolic function was normal.    The estimated ejection fraction was in the range of 55% to 60%.    Wall motion was normal; there were no regional wall motion    abnormalities. Doppler parameters are consistent with abnormal    left ventricular relaxation (grade 1 diastolic dysfunction).  - Aortic valve: Mildly calcified annulus. Trileaflet; mildly    calcified leaflets. There was mild regurgitation.  - Mitral valve: Calcified annulus. Mildly thickened leaflets .    There was trivial regurgitation.  - Right atrium: The atrium was mildly dilated. Central venous    pressure (est): 3 mm Hg.  - Tricuspid valve: There was trivial regurgitation.  - Pulmonary arteries: Systolic pressure could not be accurately    estimated.  - Pericardium, extracardiac: There was no pericardial effusion.    09/2021 monitor 14 day monitor Occasional supraventricular ectopy in the form  of isolated PACs, rare couplets and triplets. 59 runs of SVT longest lasting 1 hr 26 min. Rare ventricular ectopy in the form of isolated PVCs, couplets, triplets. 9 runs of NSVT longest 11 beats No reported symptoms   09/2021 echo 1. Left ventricular ejection fraction, by estimation, is 55 to 60%. The  left ventricle has normal function. The left ventricle has no regional  wall motion abnormalities. There is severe left ventricular hypertrophy.  Left ventricular diastolic parameters   are consistent with Grade I diastolic dysfunction (impaired relaxation).   2. Right ventricular systolic function is normal. The right ventricular  size is normal. There is normal pulmonary artery systolic pressure.   3. Left atrial size was mildly dilated.   4. Right atrial size was mild to moderately dilated.   5. The mitral valve is normal in structure. Trivial mitral valve  regurgitation.   6. The aortic valve is tricuspid. Aortic valve regurgitation is mild to  moderate. Aortic valve sclerosis is present, with no  evidence of aortic  valve stenosis.   7. The inferior vena cava is dilated in size with <50% respiratory  variability, suggesting right atrial pressure of 15 mmHg.    Assessment and Plan   1. Palpitations/PSVT - has not started lopressor yet, recommended he start the 25mg  bid as prescribed in January   2. Aortic regurgitation - 09/2021 echo mild to moderate, continue to monitor     3. HTN - above goal - restart his norvasc which he stopped taking, starting lopressor for palpitations     Antoine Poche, M.D., F.A.C.C.

## 2024-01-15 DIAGNOSIS — I471 Supraventricular tachycardia, unspecified: Secondary | ICD-10-CM | POA: Diagnosis not present

## 2024-01-15 DIAGNOSIS — D539 Nutritional anemia, unspecified: Secondary | ICD-10-CM | POA: Diagnosis not present

## 2024-01-15 DIAGNOSIS — R42 Dizziness and giddiness: Secondary | ICD-10-CM | POA: Diagnosis not present

## 2024-01-15 DIAGNOSIS — E1122 Type 2 diabetes mellitus with diabetic chronic kidney disease: Secondary | ICD-10-CM | POA: Diagnosis not present

## 2024-01-22 DIAGNOSIS — I350 Nonrheumatic aortic (valve) stenosis: Secondary | ICD-10-CM | POA: Diagnosis not present

## 2024-01-22 DIAGNOSIS — R42 Dizziness and giddiness: Secondary | ICD-10-CM | POA: Diagnosis not present

## 2024-01-22 DIAGNOSIS — E782 Mixed hyperlipidemia: Secondary | ICD-10-CM | POA: Diagnosis not present

## 2024-01-22 DIAGNOSIS — I129 Hypertensive chronic kidney disease with stage 1 through stage 4 chronic kidney disease, or unspecified chronic kidney disease: Secondary | ICD-10-CM | POA: Diagnosis not present

## 2024-01-22 DIAGNOSIS — D631 Anemia in chronic kidney disease: Secondary | ICD-10-CM | POA: Diagnosis not present

## 2024-01-22 DIAGNOSIS — N401 Enlarged prostate with lower urinary tract symptoms: Secondary | ICD-10-CM | POA: Diagnosis not present

## 2024-01-22 DIAGNOSIS — D539 Nutritional anemia, unspecified: Secondary | ICD-10-CM | POA: Diagnosis not present

## 2024-01-22 DIAGNOSIS — G894 Chronic pain syndrome: Secondary | ICD-10-CM | POA: Diagnosis not present

## 2024-01-22 DIAGNOSIS — F1010 Alcohol abuse, uncomplicated: Secondary | ICD-10-CM | POA: Diagnosis not present

## 2024-01-22 DIAGNOSIS — E1122 Type 2 diabetes mellitus with diabetic chronic kidney disease: Secondary | ICD-10-CM | POA: Diagnosis not present

## 2024-01-22 DIAGNOSIS — N184 Chronic kidney disease, stage 4 (severe): Secondary | ICD-10-CM | POA: Diagnosis not present

## 2024-01-22 DIAGNOSIS — I10 Essential (primary) hypertension: Secondary | ICD-10-CM | POA: Diagnosis not present

## 2024-01-24 ENCOUNTER — Encounter (HOSPITAL_BASED_OUTPATIENT_CLINIC_OR_DEPARTMENT_OTHER): Payer: Self-pay | Admitting: Pulmonary Disease

## 2024-01-24 ENCOUNTER — Ambulatory Visit (HOSPITAL_BASED_OUTPATIENT_CLINIC_OR_DEPARTMENT_OTHER): Admitting: Pulmonary Disease

## 2024-01-24 VITALS — BP 142/80 | HR 61 | Ht 68.5 in | Wt 170.2 lb

## 2024-01-24 DIAGNOSIS — F17210 Nicotine dependence, cigarettes, uncomplicated: Secondary | ICD-10-CM

## 2024-01-24 DIAGNOSIS — R918 Other nonspecific abnormal finding of lung field: Secondary | ICD-10-CM

## 2024-01-24 DIAGNOSIS — J449 Chronic obstructive pulmonary disease, unspecified: Secondary | ICD-10-CM

## 2024-01-24 DIAGNOSIS — J432 Centrilobular emphysema: Secondary | ICD-10-CM

## 2024-01-24 MED ORDER — ANORO ELLIPTA 62.5-25 MCG/ACT IN AEPB
1.0000 | INHALATION_SPRAY | Freq: Every day | RESPIRATORY_TRACT | 2 refills | Status: AC
Start: 2024-01-24 — End: ?

## 2024-01-24 NOTE — Progress Notes (Signed)
 Subjective:   PATIENT ID: Dylan Cline GENDER: male DOB: Jan 29, 1951, MRN: [id number]   HPI  Chief Complaint  Patient presents with   Follow-up    Centrilobular emphysema   [name] is a 73 year old male current smoker with emphysema, HTN, DM2, hx alcoholism and pancreatitis who presents for follow-up   Synopsis: At baseline he is an active smoker, smoking 1 ppd since he was a teenager. Has shortness of breath with moderate exertion but denies limitation in activity. On initial consult in 10/2018 he had a CT Chest completed on 06/02/18 that demonstrated multiple pulmonary nodules with the largest measuring 8mm. Follow-up lung screen with unchanged including RUL nodule ~61mm since 11/2019 (16 months ago). He reports long standing shortness of breath even before smoking. He started smoking at 73 years old. Denies cough or wheezing. He does not routinely exercise. He is limited by hip pain.   07/04/21 Since our last visit, he has tried Spiriva  and may have improved on it. Minimal cough. Shortness of breath with activity. Never has had wheezing. He started a part-time job at [employer]. He will notice palpitations and dizziness with sudden movements including stooping to standing. He drinks only two glasses a day. He smokes 1 ppd daily. He is still not very active and has not returned his call to Pulmonary rehab.  01/04/22 He reports compliance with Spiriva  two puffs daily. Sometimes will need an extra puff every other week. Denies coughing and wheezing. Has cut down to 1/2 pack down. Does not smoke with his co-workers. Has not be active at baseline. Denies any exacerbations since our last visit.  04/12/22 He is compliant Anoro. Decreased smoking to <1ppd. Reports shortness of breath with activity. 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 Not active at baseline due to hot weather. Denies coughing or wheezing.  12/19/22 He reports using Anoro every other day. Still smoking 1-1.5 ppd daily. Reports  shortness of breath with activity. Denies coughing or wheezing. Denies nocturnal awakenings or symptoms. Has considered quitting due to the cost. Has used nicotine gum in the past with success for 6-7 months when he stopped using cigarettes but he relapsed. He has palpitations which he believes started before this clinic visit. Has some associated shortness of breath. Denies dizziness, recent syncope, chest pain.  06/27/23 Since our last visit he has reduced smoking to 1/2 ppd compared to previous 1-1.5 ppd. He denies shortness of breath, cough or wheezing. He can walk >200 ft. No exacerbation since our last visit. He reports his primary care provider manages his vaccines and he will be seen in November. Not interested in vaccines. Compliant Anoro.   01/24/24 Since our last visit he reports his shortness of breath is stable on Anoro but will forget to take three days out of the week. Denies coughing and wheezing. No nocturnal symptoms. Continues to smoke 1/2 ppd. Has allergies. Denies exacerbations since our last visit. Sedentary at baseline  Social History: Active smoker.  40-pack-year history. 1-1.5 ppd daily  Past Medical History:  Diagnosis Date   Acute type 2 diabetes mellitus with manifestations (HCC)    no meds currently   CRI (chronic renal insufficiency), stage 3 (moderate) (HCC)    GERD (gastroesophageal reflux disease)    Hypertension    Mass of left parotid gland    Pancreatitis    Pancreatitis, alcoholic, acute 08/10/2014   Pancreatitis, alcoholic, acute 08/10/2014   Smoker      Family History  Problem Relation  Age of Onset   Diabetes Mother    Diabetes Father    Heart attack Maternal Grandfather    Cancer Paternal Grandmother    Heart attack Maternal Uncle    Cancer Maternal Aunt    Cancer Maternal Uncle    Colon cancer Neg Hx    Pancreatic cancer Neg Hx    Stomach cancer Neg Hx      Social History   Occupational History   Not on file  Tobacco Use   Smoking  status: Every Day    Current packs/day: 1.00    Average packs/day: 1 pack/day for 53.1 years (53.1 ttl pk-yrs)    Types: Cigarettes    Start date: 12/01/1970   Smokeless tobacco: Never   Tobacco comments:    Smokes a little less than 1 pack per day [facility] 04/12/22  Vaping Use   Vaping status: Never Used  Substance and Sexual Activity   Alcohol  use: Yes    Comment: 10/11/21 drinks a bottle of wine a day   Drug use: Yes    Types: Marijuana    Comment: occasionally to stimulate appetite   Sexual activity: Not on file    No Known Allergies   Outpatient Medications Prior to Visit  Medication Sig Dispense Refill   amLODipine  (NORVASC ) 10 MG tablet Take 10 mg by mouth daily.     atorvastatin  (LIPITOR) 40 MG tablet Take 40 mg by mouth daily.     Cholecalciferol (VITAMIN D) 125 MCG (5000 UT) CAPS Take 5,000 Units by mouth daily.     cyclobenzaprine  (FLEXERIL ) 10 MG tablet Take 1 tablet (10 mg total) by mouth 2 (two) times daily as needed for muscle spasms. 20 tablet 0   diclofenac Sodium (VOLTAREN) 1 % GEL Apply 2 g topically daily as needed (arthritis pain).     gabapentin (NEURONTIN) 100 MG capsule Take 100 mg by mouth at bedtime.     JANUVIA 50 MG tablet Take 50 mg by mouth daily.     LEVEMIR  FLEXTOUCH 100 UNIT/ML Pen Inject 25 Units into the skin at bedtime.      metoprolol  tartrate (LOPRESSOR ) 25 MG tablet Take 1 tablet (25 mg total) by mouth 2 (two) times daily. 60 tablet 0   pantoprazole  (PROTONIX ) 40 MG tablet Take 1 tablet by mouth once daily 30 tablet 5   tamsulosin (FLOMAX) 0.4 MG CAPS capsule Take 0.8 mg by mouth at bedtime.     umeclidinium-vilanterol (ANORO ELLIPTA ) 62.5-25 MCG/ACT AEPB Inhale 1 puff into the lungs daily. 180 each 2   vitamin B-12 (CYANOCOBALAMIN ) 1000 MCG tablet Take 1,000 mcg by mouth daily.     No facility-administered medications prior to visit.    Review of Systems  Constitutional:  Negative for chills, diaphoresis, fever, malaise/fatigue and weight loss.   HENT:  Negative for congestion.   Respiratory:  Positive for shortness of breath. Negative for cough, hemoptysis, sputum production and wheezing.   Cardiovascular:  Negative for chest pain, palpitations and leg swelling.    Objective:   Vitals:   01/24/24 1501  BP: (!) 142/80  Pulse: 61  SpO2: 99%  Weight: 170 lb 3.2 oz (77.2 kg)  Height: 5' 8.5" (1.74 m)   SpO2: 99 %  Physical Exam: General: Well-appearing, no acute distress HENT: [place], AT Eyes: EOMI, no scleral icterus Respiratory: Clear to auscultation bilaterally.  No crackles, wheezing or rales Cardiovascular: RRR, -M/R/G, no JVD Extremities:-Edema,-tenderness Neuro: AAO x4, CNII-XII grossly intact Psych: Normal mood, normal affect  Data reviewed  Imaging: CT Chest 06/12/2018 [facility] (report only, no imaging available) - "Mildly enlarged AP window lymph node measuring 1.4 x 1.2 cm (image 37).Aaron AasAaron AasMild predominant paraseptal and centrilobular emphysema. There is an 8 mm right upper lobe nodule adjacent to the fissure, 6 mm anterior right upper lobe nodule, 6 mm left lower lobe nodule and 6 mm left lower lobe nodule (image 267). Impression: Multiple pulmonary nodules, the largest measuring 8 mm.  CT abdomen pelvis 05/24/2013-lower lobes with minimal atelectasis in the bases CT Chest Lung Screen 03/09/21 - Stable lung nodules with largest 7.2 mm. Paraseptal and centrilobular emphysema. CT Chest Lung Screen 08/26/23 - Stable lung nodule with max size 6.3 mm. Emphysema  PFT: 05/19/2020 FVC 3.14 (86%) FEV1 2.61 (96%) ratio 73 TLC 86% DLCO 83%.  No significant BD Interpretation: Normal PFTs  04/12/22 FVC 1.83 (44%) FEV1 0.84 (28%) Ratio 80  TLC 82% DLCO 59% Interpretation: Reduced FVC and FEV1. No obstruction and moderately reduced gas exchange  Labs: Absolute eos 07/25/19-100    Assessment & Plan:  73 year old male active smoker with emphysema, HTN, SVT, aortic regurgitation, DM2, HLD who present for COPD follow-up. DOE however  nonadherent to LAMA/LABA. Discussed clinical course and management of COPD including bronchodilator regimen, smoking cessation, preventive care including vaccinations and action plan for exacerbation.  Emphysema/COPD GOLD Class B - DOE. Nonadherent to bronchodilators --RESTART Anoro ONE puff ONCE a day --Encourage exercise 7000 steps a day  Tobacco abuse Patient is an active smoker. Smokes 1/2 ppd We discussed smoking cessation for <3 minutes. We discussed triggers and stressors and ways to deal with them. We discussed barriers to continued smoking and benefits of smoking cessation. Provided patient with information cessation techniques and interventions including [place] quitline.   Multiple lung nodules - stable Continue annual CT lung screen. ENROLLED  Health Maintenance  There is no immunization history on file for this patient. CT lung screen - enrolled  No orders of the defined types were placed in this encounter.  No orders of the defined types were placed in this encounter.  No follow-ups on file.   I have spent a total time of 33-minutes on the day of the appointment including chart review, data review, collecting history, coordinating care and discussing medical diagnosis and plan with the patient/family. Past medical history, allergies, medications were reviewed. Pertinent imaging, labs and tests included in this note have been reviewed and interpreted independently by me.  Kalief Kattner Genetta Kenning, MD [facility] 01/24/2024 3:15 PM

## 2024-01-24 NOTE — Patient Instructions (Signed)
 Emphysema/COPD GOLD Class B - improved symptoms with reduced tobacco use --RESTART Anoro ONE puff ONCE a day --Encourage exercise 7000 steps a day  Tobacco abuse Patient is an active smoker. Smokes 1/2 ppd We discussed smoking cessation for <3 minutes. We discussed triggers and stressors and ways to deal with them. We discussed barriers to continued smoking and benefits of smoking cessation. Provided patient with information cessation techniques and interventions including [place] quitline.

## 2024-03-25 NOTE — Progress Notes (Unsigned)
 Cardiology Office Note:  .   Date:  03/26/2024  ID:  Dylan Cline, DOB Jun 24, 1951, MRN [id number] PCP: Dylan Norleen PEDLAR, MD  [facility] Providers Cardiologist:  Alvan Carrier, MD { History of Present Illness: .   Dylan Cline is a 73 y.o. male with PMHx of palpitations/SVT/NSVT (Zio 09/2021: runs of SVT, longest 1 hr 26 min. Short runs NSVT longest 9 beats), Aortic regurgitation (Echo 09/2021: Mild to moderate AI), HTN, HLD, DM2, emphysema, alcoholism and pancreatitis who reports to office for overdue follow up.   Last seen in heartcare OV 11/2021 with Dr. Alvan for returning new patient after 6 year absence.  Reported palpitations 1-2 X per month associated with lightheadedness, dizzy and SOB. Office BP elevated at 154/82. He had not started Lopressor  as previously prescribed and stopped Amlodipine .  Recommended starting Lopressor  25 mg BID and restart amlodipine  5 mg daily.   Today, reports palpitations, heart racing x1 every 2-3 mo lasting 1.5 hours, chronic unchanged SOB x years with exertion relieved with rest, chronic unchanged dizziness x years when waking up.  Denies CP, syncope, edema, orthopnea, PND. Not taking amlodipine  or Lopressor  for about 1 month  since out of meds but compliant with Lipitor. Lives at home with wife. Able to do yard work without any CP or SOB. Endorse homecooked meals with low sodium but snacks on potato chips and pork skins sometimes. Smoke 1/2 PPD, 1-2 pints of white [place] wine daily,  denies drug use. Denies any hospitalizations or visits to the emergency department.   ROS: 10 point review of system has been reviewed and considered negative except ones been listed in the HPI.   Studies Reviewed: .   09/2021 echo 1. Left ventricular ejection fraction, by estimation, is 55 to 60%. The  left ventricle has normal function. The left ventricle has no regional  wall motion abnormalities. There is severe left ventricular hypertrophy.  Left ventricular  diastolic parameters   are consistent with Grade I diastolic dysfunction (impaired relaxation).   2. Right ventricular systolic function is normal. The right ventricular  size is normal. There is normal pulmonary artery systolic pressure.   3. Left atrial size was mildly dilated.   4. Right atrial size was mild to moderately dilated.   5. The mitral valve is normal in structure. Trivial mitral valve  regurgitation.   6. The aortic valve is tricuspid. Aortic valve regurgitation is mild to  moderate. Aortic valve sclerosis is present, with no evidence of aortic  valve stenosis.   7. The inferior vena cava is dilated in size with <50% respiratory  variability, suggesting right atrial pressure of 15 mmHg.   09/2021 monitor 14 day monitor Occasional supraventricular ectopy in the form of isolated PACs, rare couplets and triplets. 59 runs of SVT longest lasting 1 hr 26 min. Rare ventricular ectopy in the form of isolated PVCs, couplets, triplets. 9 runs of NSVT longest 11 beats No reported symptoms  Physical Exam:   VS:  BP 138/82 (BP Location: Right Arm, Patient Position: Sitting, Cuff Size: Normal)   Pulse 98   Ht 5' 9 (1.753 m)   Wt 164 lb (74.4 kg)   SpO2 97%   BMI 24.22 kg/m    Wt Readings from Last 3 Encounters:  03/26/24 164 lb (74.4 kg)  01/24/24 170 lb 3.2 oz (77.2 kg)  06/27/23 173 lb 11.2 oz (78.8 kg)    GEN: Well nourished, well developed in no acute distress while sitting in chair.  NECK: No JVD; No carotid bruits CARDIAC: RRR, no murmurs, rubs, gallops RESPIRATORY:  Clear to auscultation without rales, wheezing or rhonchi  ABDOMEN: Soft, non-tender, non-distended EXTREMITIES:  No edema; No deformity   ASSESSMENT AND PLAN: .   HTN Not taking amlodipine  or Toprol -XL for about 1 month.  BP this office visit, mildly elevated 142/78, repeat 138/72.   Reports chronic unchanged dizziness x years when waking up.  Suspect could be 2/2 to low BP when on HTN medications  since BP just mildly elevated without medications.  Recommended only restarting one of the 2 HTN medications.  However, patient reports that home BPs are elevated sometimes SBP > 160 and prefers restarting both medications.  Restart amlodipine  5 mg daily and Lopressor  25 mg BID Discussed proper BP measurements.  Encouraged healthy lifestyle recommendations.  BP log X 2 weeks.  If BP elevated, can consider increasing amlodipine  to 10 mg daily.   Palpitations/SVT/NSVT  Zio 09/2021: 59 runs of SVT, longest 1 hr 26 min. Short runs NSVT longest 9 beats  Reports palpitations, heart racing x1 every 2-3 mo lasting 1.5 hours. Denies syncope.  Restart Lopressor  as above  Aortic regurgitation 03/2016 echo LVEF 55-60%, grade I dd, mild AI, trivial MR 2022: 55-60%, grade 1 DD, LA mildly dilated, trivial MVR, mild to moderate AI with sclerosis  Order ECHO.   HLD  LDL goal < 100 01/2024 LDL 58, AST/ALT WNL  Reports compliance with Lipitor. Continue Lipitor 40 mg  DM2 A1C 01/2024: 6.1 Managed by PCP  Substance use  Smokes 1/2 PPD & 1-2 pints of white [place] wine daily.  Discussed cessation but not interested.    Dispo: Follow up in 3 months   Signed, Dylan CINDERELLA Kapur, PA-C

## 2024-03-26 ENCOUNTER — Ambulatory Visit: Attending: Student | Admitting: Physician Assistant

## 2024-03-26 ENCOUNTER — Encounter: Payer: Self-pay | Admitting: Physician Assistant

## 2024-03-26 VITALS — BP 138/82 | HR 98 | Ht 69.0 in | Wt 164.0 lb

## 2024-03-26 DIAGNOSIS — E785 Hyperlipidemia, unspecified: Secondary | ICD-10-CM | POA: Diagnosis not present

## 2024-03-26 DIAGNOSIS — I351 Nonrheumatic aortic (valve) insufficiency: Secondary | ICD-10-CM

## 2024-03-26 DIAGNOSIS — I10 Essential (primary) hypertension: Secondary | ICD-10-CM

## 2024-03-26 DIAGNOSIS — R002 Palpitations: Secondary | ICD-10-CM | POA: Diagnosis not present

## 2024-03-26 DIAGNOSIS — I471 Supraventricular tachycardia, unspecified: Secondary | ICD-10-CM | POA: Diagnosis not present

## 2024-03-26 DIAGNOSIS — E119 Type 2 diabetes mellitus without complications: Secondary | ICD-10-CM

## 2024-03-26 DIAGNOSIS — F1990 Other psychoactive substance use, unspecified, uncomplicated: Secondary | ICD-10-CM | POA: Diagnosis not present

## 2024-03-26 DIAGNOSIS — I4729 Other ventricular tachycardia: Secondary | ICD-10-CM | POA: Diagnosis not present

## 2024-03-26 DIAGNOSIS — Z794 Long term (current) use of insulin: Secondary | ICD-10-CM

## 2024-03-26 DIAGNOSIS — Z136 Encounter for screening for cardiovascular disorders: Secondary | ICD-10-CM

## 2024-03-26 MED ORDER — AMLODIPINE BESYLATE 5 MG PO TABS: 5.0000 mg | ORAL_TABLET | Freq: Every day | ORAL | 1 refills | Status: DC

## 2024-03-26 MED ORDER — METOPROLOL TARTRATE 25 MG PO TABS: 25.0000 mg | ORAL_TABLET | Freq: Two times a day (BID) | ORAL | 3 refills | Status: AC

## 2024-03-26 NOTE — Patient Instructions (Addendum)
 Medication Instructions:   Restart Lopressor  25mg  twice daily.   Restart Amlodipine  5mg  once daily.   Return Blood Pressure log to the office in 2 weeks.   *If you need a refill on your cardiac medications before your next appointment, please call your pharmacy*   Testing/Procedures:  Echocardiogram - Ultrasound of your heart.  Follow-Up: At [facility], you and your health needs are our priority.  As part of our continuing mission to provide you with exceptional heart care, our providers are all part of one team.  This team includes your primary Cardiologist (physician) and Advanced Practice Providers or APPs (Physician Assistants and Nurse Practitioners) who all work together to provide you with the care you need, when you need it.  Your next appointment:   3 month(s)  Provider:   You may see Alvan Carrier, MD or one of the following Advanced Practice Providers on your designated Care Team:   Laymon Qua, PA-C  [place], [place] Olivia [place], [place]     We recommend signing up for the patient portal called MyChart.  Sign up information is provided on this After Visit Summary.  MyChart is used to connect with patients for Virtual Visits (Telemedicine).  Patients are able to view lab/test results, encounter notes, upcoming appointments, etc.  Non-urgent messages can be sent to your provider as well.   To learn more about what you can do with MyChart, go to [email or web address].

## 2024-03-28 ENCOUNTER — Ambulatory Visit (HOSPITAL_COMMUNITY)
Admission: RE | Admit: 2024-03-28 | Discharge: 2024-03-28 | Disposition: A | Discharge status: Home / Self Care | Source: Ambulatory Visit | Attending: Physician Assistant

## 2024-03-28 DIAGNOSIS — I351 Nonrheumatic aortic (valve) insufficiency: Secondary | ICD-10-CM | POA: Insufficient documentation

## 2024-03-28 LAB — ECHOCARDIOGRAM COMPLETE
Area-P 1/2: 1.74 cm2
P 1/2 time: 483 ms
S' Lateral: 3.1 cm

## 2024-03-28 NOTE — Progress Notes (Signed)
*  PRELIMINARY RESULTS* Echocardiogram 2D Echocardiogram has been performed.  Dylan Cline 03/28/2024, 12:23 PM

## 2024-03-30 ENCOUNTER — Ambulatory Visit: Payer: Self-pay | Admitting: Physician Assistant

## 2024-04-15 ENCOUNTER — Encounter: Payer: Self-pay | Admitting: Cardiology

## 2024-05-23 ENCOUNTER — Encounter: Payer: Self-pay | Admitting: Radiology

## 2024-06-08 ENCOUNTER — Other Ambulatory Visit: Payer: Self-pay | Admitting: Internal Medicine

## 2024-06-09 ENCOUNTER — Telehealth: Payer: Self-pay | Admitting: Gastroenterology

## 2024-06-09 NOTE — Telephone Encounter (Signed)
 Please call patient to schedule office visit with Dr. Shaaron for medication refill. Patient requested medication refill but has not been seen in 2 years. Unable to refill prescription until he is seen.

## 2024-06-16 ENCOUNTER — Ambulatory Visit: Payer: Self-pay | Admitting: Cardiology

## 2024-06-16 MED ORDER — AMLODIPINE BESYLATE 10 MG PO TABS: 10.0000 mg | ORAL_TABLET | Freq: Every day | ORAL | 3 refills | Status: DC

## 2024-06-16 NOTE — Telephone Encounter (Signed)
 I spoke with both patient and wife and sent new rx for amlodipine  10 mg daily to [facility]

## 2024-06-16 NOTE — Telephone Encounter (Signed)
-----   Message from Alvan Carrier sent at 06/16/2024 10:27 AM EDT ----- BP's too high, please increase norvasc  to 10mg  daily  JINNY Alvan MD ----- Message ----- From: Latisha Alan MATSU Sent: 04/15/2024   4:35 PM EDT To: Carrier JULIANNA Alvan, MD

## 2024-06-30 ENCOUNTER — Other Ambulatory Visit: Payer: Self-pay | Admitting: Internal Medicine

## 2024-07-15 ENCOUNTER — Ambulatory Visit: Admitting: Internal Medicine

## 2024-07-15 ENCOUNTER — Encounter: Payer: Self-pay | Admitting: *Deleted

## 2024-07-15 ENCOUNTER — Other Ambulatory Visit: Payer: Self-pay | Admitting: *Deleted

## 2024-07-15 ENCOUNTER — Encounter: Payer: Self-pay | Admitting: Internal Medicine

## 2024-07-15 ENCOUNTER — Other Ambulatory Visit: Payer: Self-pay

## 2024-07-15 VITALS — BP 136/73 | HR 82 | Temp 98.6°F | Ht 69.0 in | Wt 172.6 lb

## 2024-07-15 DIAGNOSIS — K219 Gastro-esophageal reflux disease without esophagitis: Secondary | ICD-10-CM

## 2024-07-15 DIAGNOSIS — F1090 Alcohol use, unspecified, uncomplicated: Secondary | ICD-10-CM | POA: Diagnosis not present

## 2024-07-15 MED ORDER — PANTOPRAZOLE SODIUM 40 MG PO TBEC: 40.0000 mg | DELAYED_RELEASE_TABLET | Freq: Every day | ORAL | 3 refills | Status: AC

## 2024-07-15 MED ORDER — PEG 3350-KCL-NA BICARB-NACL 420 G PO SOLR: 4000.0000 mL | Freq: Once | ORAL | 0 refills | Status: AC

## 2024-07-15 NOTE — Progress Notes (Addendum)
 Gastroenterology Progress Note    Primary Care Physician:  Shona Norleen PEDLAR, MD Primary Gastroenterologist:  Dr. Shaaron  Pre-Procedure History & Physical: HPI:  Dylan Cline is a 73 y.o. male here for for follow-up of GERD.  Relatively long absence from the practice last seen 2023.  GERD well-controlled on Protonix  40 mg daily he drinks 2 bottles of wild [place] Rose's wine every 1 to 2 days has done so for a long time.  History of complicated pancreatitis requiring pseudocyst drainage at [facility] many years ago.  Prior EGD demonstrated normal esophagus some petechial gastric hemorrhages felt to be secondary to heaving.  Last screening colonoscopy 2006.  Suboptimal preparation no obvious significant lesions.  He was scheduled come back and have a colonoscopy for screening 2023 but failed to show up.  We discussed the need to have updated colonoscopy at this time.  He says he would like to have 1.  He had does not have any dysphagia early satiety nausea or vomiting he is run out of Protonix  and does have recurrent reflux symptoms.  Past Medical History:  Diagnosis Date   Acute type 2 diabetes mellitus with manifestations (HCC)    no meds currently   CRI (chronic renal insufficiency), stage 3 (moderate)    GERD (gastroesophageal reflux disease)    Hypertension    Mass of left parotid gland    Pancreatitis    Pancreatitis, alcoholic, acute 08/10/2014   Pancreatitis, alcoholic, acute 08/10/2014   Smoker     Past Surgical History:  Procedure Laterality Date   CARPAL TUNNEL RELEASE Right 08/22/2021   Procedure: CARPAL TUNNEL RELEASE;  Surgeon: Onesimo Oneil LABOR, MD;  Location: [facility];  Service: Orthopedics;  Laterality: Right;   CATARACT EXTRACTION W/PHACO Right 11/24/2019   Procedure: CATARACT EXTRACTION PHACO AND INTRAOCULAR LENS PLACEMENT (IOC);  Surgeon: Harrie Agent, MD;  Location: [facility];  Service: Ophthalmology;  Laterality: Right;  CDE: 4.96   CATARACT EXTRACTION W/PHACO Left 07/26/2020    Procedure: CATARACT EXTRACTION PHACO AND INTRAOCULAR LENS PLACEMENT (IOC);  Surgeon: Harrie Agent, MD;  Location: [facility];  Service: Ophthalmology;  Laterality: Left;  CDE: 6.94   COLONOSCOPY   01/27/2005   MFM:Pwuzmwjo hemorrhoids, otherwise normal rectum/Normal colon, marginal prep made the exam more difficult   ESOPHAGOGASTRODUODENOSCOPY   10/12/2004   MFM:Wnmfjo esophagus/Focal area of submucosal petechial hemorrhage in the fundus (likely a trauma-induced phenomenon secondary to vomiting), otherwise normal   LAPAROSCOPIC GASTROTOMY W/ REPAIR OF ULCER     PAROTIDECTOMY Left 07/29/2019   Procedure: LEFT PAROTIDECTOMY;  Surgeon: Karis Clunes, MD;  Location: [facility];  Service: ENT;  Laterality: Left;   TRIGGER FINGER RELEASE Right 08/22/2021   Procedure: RELEASE TRIGGER FINGER/A-1 PULLEY;  Surgeon: Onesimo Oneil LABOR, MD;  Location: [facility];  Service: Orthopedics;  Laterality: Right;  Right long and ring finger trigger release    Prior to Admission medications   Medication Sig Start Date End Date Taking? Authorizing Provider  atorvastatin  (LIPITOR) 40 MG tablet Take 40 mg by mouth daily.   Yes [provider]  diclofenac Sodium (VOLTAREN) 1 % GEL Apply 2 g topically daily as needed (arthritis pain).   Yes [provider]  JANUVIA 50 MG tablet Take 50 mg by mouth daily. 02/03/22  Yes [provider]  LEVEMIR  FLEXTOUCH 100 UNIT/ML Pen Inject 25 Units into the skin at bedtime.  11/03/19  Yes [provider]  sodium bicarbonate  650 MG tablet Take 1,300 mg by mouth  2 (two) times daily. 07/01/24  Yes [provider]  tamsulosin (FLOMAX) 0.4 MG CAPS capsule Take 0.8 mg by mouth at bedtime. 02/09/22  Yes [provider]  umeclidinium-vilanterol (ANORO ELLIPTA ) 62.5-25 MCG/ACT AEPB Inhale 1 puff into the lungs daily. 01/24/24  Yes Kassie Acquanetta Bradley, MD  amLODipine  (NORVASC ) 10 MG tablet Take 1 tablet (10 mg total) by mouth daily. Patient not  taking: Reported on 07/15/2024 06/16/24 09/14/24  Alvan Dorn FALCON, MD  Cholecalciferol (VITAMIN D) 125 MCG (5000 UT) CAPS Take 5,000 Units by mouth daily. Patient not taking: Reported on 07/15/2024    [provider]  cyclobenzaprine  (FLEXERIL ) 10 MG tablet Take 1 tablet (10 mg total) by mouth 2 (two) times daily as needed for muscle spasms. Patient not taking: Reported on 07/15/2024 10/14/21   Onesimo Oneil LABOR, MD  gabapentin (NEURONTIN) 100 MG capsule Take 100 mg by mouth at bedtime. Patient not taking: Reported on 07/15/2024 12/24/23   [provider]  metoprolol  tartrate (LOPRESSOR ) 25 MG tablet Take 1 tablet (25 mg total) by mouth 2 (two) times daily. Patient not taking: Reported on 07/15/2024 03/26/24   Sheron Lorette GRADE, PA-C  pantoprazole  (PROTONIX ) 40 MG tablet Take 1 tablet by mouth once daily Patient not taking: Reported on 07/15/2024 07/03/24   Shaaron Lamar HERO, MD  vitamin B-12 (CYANOCOBALAMIN ) 1000 MCG tablet Take 1,000 mcg by mouth daily. Patient not taking: Reported on 07/15/2024    [provider]    Allergies as of 07/15/2024   (Not on File)    Family History  Problem Relation Age of Onset   Diabetes Mother    Diabetes Father    Heart attack Maternal Grandfather    Cancer Paternal Grandmother    Heart attack Maternal Uncle    Cancer Maternal Aunt    Cancer Maternal Uncle    Colon cancer Neg Hx    Pancreatic cancer Neg Hx    Stomach cancer Neg Hx     Social History   Socioeconomic History   Marital status: Married    Spouse name: Not on file   Number of children: Not on file   Years of education: Not on file   Highest education level: Not on file  Occupational History   Not on file  Tobacco Use   Smoking status: Every Day    Current packs/day: 1.00    Average packs/day: 1 pack/day for 53.6 years (53.6 ttl pk-yrs)    Types: Cigarettes    Start date: 12/01/1970   Smokeless tobacco: Never   Tobacco comments:    Smokes a little less  than 1 pack per day MRC 04/12/22  Vaping Use   Vaping status: Never Used  Substance and Sexual Activity   Alcohol  use: Yes    Comment: 10/11/21 drinks a bottle of wine a day   Drug use: Yes    Types: Marijuana    Comment: occasionally to stimulate appetite   Sexual activity: Not on file  Other Topics Concern   Not on file  Social History Narrative   Not on file   Social Drivers of Health   Financial Resource Strain: Not on file  Food Insecurity: Not on file  Transportation Needs: Not on file  Physical Activity: Not on file  Stress: Not on file  Social Connections: Not on file  Intimate Partner Violence: Not on file    Review of Systems   See HPI, otherwise negative ROS  Physical Exam: BP 136/73 (BP Location: Right Arm,  Patient Position: Sitting, Cuff Size: Large)   Pulse 82   Temp 98.6 F (37 C) (Temporal)   Ht 5' 9 (1.753 m)   Wt 172 lb 9.6 oz (78.3 kg)   BMI 25.49 kg/m  General:   Alert,  Well-developed, well-nourished, pleasant and cooperative in NAD Neck:  Supple; no masses or thyromegaly. No significant cervical adenopathy. Lungs:  Clear throughout to auscultation.   No wheezes, crackles, or rhonchi. No acute distress. Heart:  Regular rate and rhythm; no murmurs, clicks, rubs,  or gallops. Abdomen: Non-distended, normal bowel sounds.  Soft and nontender without appreciable mass or hepatosplenomegaly.    Impression/Plan:   73 year old gentleman with longstanding GERD in the setting of alcohol  use disorder.   Historically well-controlled on pantoprazole  40 mg daily.  No alarm symptoms.  Patient is willfully overdue for colorectal cancer screening via colonoscopy.  We discussed the various options of colon cancer screening.  Colonoscopy is the ideal approach.  Patient states she would like to have 1 and ask us  to go ahead and schedule.  History of alcohol  use disorder history of complicated pancreatitis aside from ongoing alcohol  use;   no apparent complications at  this time.  Recommendations:  We will refill your Protonix  40 mg 1 pill daily for acid reflux best taken 30 minutes before breakfast.  Dispense 90 with 3 refills  As discussed, I have recommended a screening colonoscopy-you are overdue.  We will schedule a screening colonoscopy in the near future.  ASA 3 room 1 2 or 3 okay.  The risks, benefits, limitations, alternatives and imponderables have been reviewed with the patient. Questions have been answered. All parties are agreeable.    We discussed the importance of taking the prep as recommended and keeping your appointment for the colonoscopy.  Emphasized the need to abstain from alcohol  consumption.  Further recommendations to follow.  Notice: This dictation was prepared with Dragon dictation along with smaller phrase technology. Any transcriptional errors that result from this process are unintentional and may not be corrected upon review.  It was good

## 2024-07-15 NOTE — Patient Instructions (Signed)
 Seeing you again today!  We will refill your Protonix  40 mg 1 pill daily for acid reflux best taken 30 minutes before breakfast.  Dispense 90 with 3 refills  As discussed, I have recommended a send screening colonoscopy-you are overdue.  We will schedule a screening colonoscopy in the near future.  ASA 3 room 1 2 or 3 okay  We discussed the importance of taking the prep as recommended and keeping your appointment for the colonoscopy.

## 2024-07-17 ENCOUNTER — Ambulatory Visit (HOSPITAL_BASED_OUTPATIENT_CLINIC_OR_DEPARTMENT_OTHER): Admitting: Pulmonary Disease

## 2024-07-17 ENCOUNTER — Encounter (HOSPITAL_BASED_OUTPATIENT_CLINIC_OR_DEPARTMENT_OTHER): Payer: Self-pay | Admitting: Pulmonary Disease

## 2024-07-18 DIAGNOSIS — R42 Dizziness and giddiness: Secondary | ICD-10-CM | POA: Diagnosis not present

## 2024-07-18 DIAGNOSIS — E785 Hyperlipidemia, unspecified: Secondary | ICD-10-CM | POA: Diagnosis not present

## 2024-07-18 DIAGNOSIS — I129 Hypertensive chronic kidney disease with stage 1 through stage 4 chronic kidney disease, or unspecified chronic kidney disease: Secondary | ICD-10-CM | POA: Diagnosis not present

## 2024-07-18 DIAGNOSIS — D539 Nutritional anemia, unspecified: Secondary | ICD-10-CM | POA: Diagnosis not present

## 2024-07-18 DIAGNOSIS — Z008 Encounter for other general examination: Secondary | ICD-10-CM | POA: Diagnosis not present

## 2024-07-18 DIAGNOSIS — I471 Supraventricular tachycardia, unspecified: Secondary | ICD-10-CM | POA: Diagnosis not present

## 2024-07-18 DIAGNOSIS — N184 Chronic kidney disease, stage 4 (severe): Secondary | ICD-10-CM | POA: Diagnosis not present

## 2024-07-18 DIAGNOSIS — E1122 Type 2 diabetes mellitus with diabetic chronic kidney disease: Secondary | ICD-10-CM | POA: Diagnosis not present

## 2024-07-22 NOTE — Progress Notes (Unsigned)
 Cardiology Office Note    Date:  07/23/2024  ID:  Derrion, Tritz 05/27/1951, MRN [id number] Cardiologist: Alvan Carrier, MD { :  History of Present Illness:    DAEMON DOWTY is a 73 y.o. male  with past medical history of HTN, Type II DM, HTN, Stage 4 CKD, COPD, tobacco use, aortic regurgitation, pancreatitis and palpitations who presents to the office today for 61-month follow-up.  He was last examined by Dena Kapur, PA in 03/2024 and reported still having occasional palpitations which would occur once every 2 to 3 months and last for over an hour. Also reported having chronic, unchanged shortness of breath but no associated chest pain. He had been without Amlodipine  and Toprol -XL for over a month and was restarted on Amlodipine  5 mg daily and Lopressor  25 mg twice daily. Was provided with a BP log and if BP remained elevated, would increase Amlodipine  to 10 mg daily. A follow-up echocardiogram was also recommended for reassessment of his aortic regurgitation and this showed a preserved EF of 60 to 65% with no regional wall motion abnormalities. He did have moderate LVH, normal RV function, mild dilatation of the ascending aorta at 36 mm and moderate aortic valve regurgitation. He did return a blood pressure log in 06/2024 and BP remained above goal, therefore Dr. Alvan recommended increasing Amlodipine  to 10 mg daily  In talking with the patient today, he reports overall doing well since his last office visit. Reports that his blood pressure was elevated one day and he felt off during that timeframe but no recurrence since. BP is well-controlled at 122/64 during today's visit. He denies any recent exertional chest pain, orthopnea, PND or pitting edema. Has baseline dyspnea on exertion but no progression of symptoms. Reports occasional palpitations which only last for a few seconds and spontaneously resolve. He does continue to smoke approximately 5 cigarettes/day.  Studies Reviewed:    EKG: EKG is not ordered today.  Event Monitor: 11/2021 14 day monitor Occasional supraventricular ectopy in the form of isolated PACs, rare couplets and triplets. 59 runs of SVT longest lasting 1 hr 26 min. Rare ventricular ectopy in the form of isolated PVCs, couplets, triplets. 9 runs of NSVT longest 11 beats No reported symptoms     Patch Wear Time:  13 days and 16 hours (2022-12-21T22:59:50-0500 to 2023-01-04T15:32:52-0500)   Patient had a min HR of 45 bpm, max HR of 231 bpm, and avg HR of 82 bpm. Predominant underlying rhythm was Ectopic Atrial Rhythm. 9 Ventricular Tachycardia runs occurred, the run with the fastest interval lasting 10 beats with a max rate of 231 bpm, the  longest lasting 11 beats with an avg rate of 150 bpm. 59 Supraventricular Tachycardia runs occurred, the run with the fastest interval lasting 5 mins 15 secs with a max rate of 207 bpm, the longest lasting 1 hour 26 mins with an avg rate of 156 bpm.  Supraventricular Tachycardia was present at de-activation of device. Ectopic Atrial Rhythm was present. Supraventricular Tachycardia was detected within +/- 45 seconds of symptomatic patient event(s). Isolated SVEs were occasional (1.8%, [id number]), SVE  Couplets were rare (<1.0%, 792), and SVE Triplets were rare (<1.0%, 119). Isolated VEs were rare (<1.0%, 7664), VE Couplets were rare (<1.0%, 92), and VE Triplets were rare (<1.0%, 12). Ventricular Bigeminy and Trigeminy were present. MD notification  criteria for Supraventricular Tachycardia met - report posted prior to notification per account request (AB).    Echocardiogram: 03/2024 IMPRESSIONS  1. Left ventricular ejection fraction, by estimation, is 60 to 65%. The  left ventricle has normal function. The left ventricle has no regional  wall motion abnormalities. There is moderate left ventricular hypertrophy.  Left ventricular diastolic  parameters are indeterminate.   2. Right ventricular systolic function is  normal. The right ventricular  size is normal.   3. Left atrial size was mildly dilated.   4. The mitral valve is normal in structure. No evidence of mitral valve  regurgitation. No evidence of mitral stenosis.   5. The aortic valve is tricuspid. There is mild calcification of the  aortic valve. There is mild thickening of the aortic valve. Aortic valve  regurgitation is moderate.   6. Aortic dilatation noted. There is mild dilatation of the ascending  aorta, measuring 36 mm.   7. The inferior vena cava is normal in size with greater than 50%  respiratory variability, suggesting right atrial pressure of 3 mmHg.    Physical Exam:   VS:  BP 122/64 (BP Location: Left Arm, Cuff Size: Normal)   Pulse (!) 59   Ht 5' 9 (1.753 m)   Wt 171 lb (77.6 kg)   SpO2 98%   BMI 25.25 kg/m    Wt Readings from Last 3 Encounters:  07/23/24 171 lb (77.6 kg)  07/15/24 172 lb 9.6 oz (78.3 kg)  03/26/24 164 lb (74.4 kg)     GEN: Well nourished, well developed male appearing in no acute distress NECK: No JVD; No carotid bruits CARDIAC: RRR, 2/6 diastolic murmur along RUSB.  RESPIRATORY:  Clear to auscultation without rales, wheezing or rhonchi  ABDOMEN: Appears non-distended. No obvious abdominal masses. EXTREMITIES: No clubbing or cyanosis. No pitting edema.  Distal pedal pulses are 2+ bilaterally.   Assessment and Plan:   1. Essential hypertension - BP is well-controlled at 122/64 during today's visit. Continue current medical therapy with Amlodipine  10 mg daily and Lopressor  25 mg twice daily.  2. Palpitations - Prior monitor in 11/2021 showed episodes of SVT and 1 run of NSVT along with PAC's and PVC's. He reports occasional, brief palpitations but no persistent symptoms. Continue Lopressor  25 mg twice daily.  Would not further titrate given his baseline heart rate in the 50's.  3. Aortic valve insufficiency, etiology of cardiac valve disease unspecified - He had moderate aortic valve  regurgitation by echocardiogram in 03/2024 with mild dilatation of the ascending aorta at 36 mm.  Would plan for follow-up imaging in 1 year for reassessment and this can be ordered at the time of his next office visit.  4. HLD - Followed by PCP. LDL was at 56 when checked in 07/2024. Continue current medical therapy with Atorvastatin  40mg  daily.   5. Stage 4 CKD - Creatinine was at 2.57 when checked by his PCP earlier this month which is close to his known baseline.  Signed, Laymon CHRISTELLA Qua, PA-C

## 2024-07-23 ENCOUNTER — Encounter: Payer: Self-pay | Admitting: Student

## 2024-07-23 ENCOUNTER — Ambulatory Visit: Attending: Student | Admitting: Student

## 2024-07-23 VITALS — BP 122/64 | HR 59 | Ht 69.0 in | Wt 171.0 lb

## 2024-07-23 DIAGNOSIS — I10 Essential (primary) hypertension: Secondary | ICD-10-CM

## 2024-07-23 DIAGNOSIS — R002 Palpitations: Secondary | ICD-10-CM

## 2024-07-23 DIAGNOSIS — I351 Nonrheumatic aortic (valve) insufficiency: Secondary | ICD-10-CM

## 2024-07-23 DIAGNOSIS — N184 Chronic kidney disease, stage 4 (severe): Secondary | ICD-10-CM

## 2024-07-23 DIAGNOSIS — E785 Hyperlipidemia, unspecified: Secondary | ICD-10-CM

## 2024-07-23 MED ORDER — AMLODIPINE BESYLATE 10 MG PO TABS: 10.0000 mg | ORAL_TABLET | Freq: Every day | ORAL | 3 refills | Status: AC

## 2024-07-23 NOTE — Patient Instructions (Signed)

## 2024-07-24 DIAGNOSIS — Z0000 Encounter for general adult medical examination without abnormal findings: Secondary | ICD-10-CM | POA: Diagnosis not present

## 2024-07-24 DIAGNOSIS — I129 Hypertensive chronic kidney disease with stage 1 through stage 4 chronic kidney disease, or unspecified chronic kidney disease: Secondary | ICD-10-CM | POA: Diagnosis not present

## 2024-07-24 DIAGNOSIS — I10 Essential (primary) hypertension: Secondary | ICD-10-CM | POA: Diagnosis not present

## 2024-07-24 DIAGNOSIS — E1122 Type 2 diabetes mellitus with diabetic chronic kidney disease: Secondary | ICD-10-CM | POA: Diagnosis not present

## 2024-07-24 DIAGNOSIS — F1010 Alcohol abuse, uncomplicated: Secondary | ICD-10-CM | POA: Diagnosis not present

## 2024-07-24 DIAGNOSIS — I350 Nonrheumatic aortic (valve) stenosis: Secondary | ICD-10-CM | POA: Diagnosis not present

## 2024-07-24 DIAGNOSIS — R42 Dizziness and giddiness: Secondary | ICD-10-CM | POA: Diagnosis not present

## 2024-07-24 DIAGNOSIS — N184 Chronic kidney disease, stage 4 (severe): Secondary | ICD-10-CM | POA: Diagnosis not present

## 2024-07-24 DIAGNOSIS — Z0001 Encounter for general adult medical examination with abnormal findings: Secondary | ICD-10-CM | POA: Diagnosis not present

## 2024-07-24 DIAGNOSIS — N401 Enlarged prostate with lower urinary tract symptoms: Secondary | ICD-10-CM | POA: Diagnosis not present

## 2024-07-24 DIAGNOSIS — R221 Localized swelling, mass and lump, neck: Secondary | ICD-10-CM | POA: Diagnosis not present

## 2024-07-24 DIAGNOSIS — G894 Chronic pain syndrome: Secondary | ICD-10-CM | POA: Diagnosis not present

## 2024-08-04 ENCOUNTER — Telehealth: Payer: Self-pay | Admitting: *Deleted

## 2024-08-04 NOTE — Telephone Encounter (Signed)
 Spoke with spouse. Rescheduled to 12/4. Aware will mail new instructions. Message to endo making aware to change.

## 2024-08-04 NOTE — Telephone Encounter (Signed)
-----   Message from Elveria GRADE sent at 08/04/2024 11:09 AM EST ----- Patient's wife LM that they need to reschedule.  I have placed him in the depot.  Just let me know.  Thanks,

## 2024-08-26 ENCOUNTER — Ambulatory Visit (HOSPITAL_COMMUNITY): Admission: RE | Admit: 2024-08-26 | Source: Ambulatory Visit

## 2024-09-02 ENCOUNTER — Encounter: Payer: Self-pay | Admitting: *Deleted

## 2024-09-02 NOTE — Telephone Encounter (Signed)
 Spoke to pt's spouse and she said he needed to reschedule due to being sick. He has been rescheduled to 09/29/24. Updated instructions mailed.

## 2024-09-29 ENCOUNTER — Other Ambulatory Visit: Payer: Self-pay

## 2024-09-29 ENCOUNTER — Ambulatory Visit (HOSPITAL_COMMUNITY)
Admission: RE | Admit: 2024-09-29 | Discharge: 2024-09-29 | Disposition: A | Discharge status: Home / Self Care | Attending: Internal Medicine | Admitting: Internal Medicine

## 2024-09-29 ENCOUNTER — Ambulatory Visit (HOSPITAL_COMMUNITY)

## 2024-09-29 ENCOUNTER — Encounter (HOSPITAL_COMMUNITY)
Admission: RE | Disposition: A | Discharge status: Home / Self Care | Payer: Self-pay | Source: Home / Self Care | Attending: Internal Medicine

## 2024-09-29 ENCOUNTER — Encounter (HOSPITAL_COMMUNITY): Payer: Self-pay | Admitting: Internal Medicine

## 2024-09-29 DIAGNOSIS — Z1211 Encounter for screening for malignant neoplasm of colon: Secondary | ICD-10-CM | POA: Diagnosis not present

## 2024-09-29 DIAGNOSIS — Z79899 Other long term (current) drug therapy: Secondary | ICD-10-CM | POA: Diagnosis not present

## 2024-09-29 DIAGNOSIS — Z794 Long term (current) use of insulin: Secondary | ICD-10-CM | POA: Diagnosis not present

## 2024-09-29 DIAGNOSIS — E1122 Type 2 diabetes mellitus with diabetic chronic kidney disease: Secondary | ICD-10-CM | POA: Diagnosis not present

## 2024-09-29 DIAGNOSIS — Z7984 Long term (current) use of oral hypoglycemic drugs: Secondary | ICD-10-CM | POA: Insufficient documentation

## 2024-09-29 DIAGNOSIS — I129 Hypertensive chronic kidney disease with stage 1 through stage 4 chronic kidney disease, or unspecified chronic kidney disease: Secondary | ICD-10-CM

## 2024-09-29 DIAGNOSIS — F17210 Nicotine dependence, cigarettes, uncomplicated: Secondary | ICD-10-CM | POA: Diagnosis not present

## 2024-09-29 DIAGNOSIS — N183 Chronic kidney disease, stage 3 unspecified: Secondary | ICD-10-CM | POA: Diagnosis not present

## 2024-09-29 DIAGNOSIS — D123 Benign neoplasm of transverse colon: Secondary | ICD-10-CM | POA: Diagnosis not present

## 2024-09-29 DIAGNOSIS — J449 Chronic obstructive pulmonary disease, unspecified: Secondary | ICD-10-CM | POA: Diagnosis not present

## 2024-09-29 DIAGNOSIS — D120 Benign neoplasm of cecum: Secondary | ICD-10-CM

## 2024-09-29 DIAGNOSIS — Z833 Family history of diabetes mellitus: Secondary | ICD-10-CM | POA: Insufficient documentation

## 2024-09-29 HISTORY — PX: COLONOSCOPY: SHX5424

## 2024-09-29 HISTORY — PX: POLYPECTOMY: SHX149

## 2024-09-29 LAB — GLUCOSE, CAPILLARY
Glucose-Capillary: 125 mg/dL — ABNORMAL HIGH (ref 70–99)
Glucose-Capillary: 109 mg/dL — ABNORMAL HIGH (ref 70–99)

## 2024-09-29 SURGERY — COLONOSCOPY
Anesthesia: General

## 2024-09-29 MED ORDER — LACTATED RINGERS IV SOLN: INTRAVENOUS | Status: DC

## 2024-09-29 MED ORDER — PROPOFOL 500 MG/50ML IV EMUL
INTRAVENOUS | Status: DC | PRN
  Administered 2024-09-29: 150 ug/kg/min via INTRAVENOUS

## 2024-09-29 MED ORDER — LIDOCAINE 2% (20 MG/ML) 5 ML SYRINGE
INTRAMUSCULAR | Status: DC | PRN
  Administered 2024-09-29: 50 mg via INTRAVENOUS

## 2024-09-29 MED ORDER — PROPOFOL 10 MG/ML IV BOLUS
INTRAVENOUS | Status: DC | PRN
  Administered 2024-09-29: 80 mg via INTRAVENOUS

## 2024-09-29 MED ORDER — PHENYLEPHRINE 80 MCG/ML (10ML) SYRINGE FOR IV PUSH (FOR BLOOD PRESSURE SUPPORT)
PREFILLED_SYRINGE | INTRAVENOUS | Status: DC | PRN
  Administered 2024-09-29: 120 ug via INTRAVENOUS

## 2024-09-29 NOTE — H&P (Signed)
 @LOGO @   Gastroenterology Progress Note    Primary Care Physician:  Shona Norleen PEDLAR, MD Primary Gastroenterologist:  Dr. Shaaron  Pre-Procedure History & Physical: HPI:  Dylan Cline is a 73 y.o. male here for average risk screening colonoscopy.  Overdue.  Past Medical History:  Diagnosis Date   Acute type 2 diabetes mellitus with manifestations (HCC)    no meds currently   CRI (chronic renal insufficiency), stage 3 (moderate)    GERD (gastroesophageal reflux disease)    Hypertension    Mass of left parotid gland    Pancreatitis    Pancreatitis, alcoholic, acute 08/10/2014   Pancreatitis, alcoholic, acute 08/10/2014   Smoker     Past Surgical History:  Procedure Laterality Date   CARPAL TUNNEL RELEASE Right 08/22/2021   Procedure: CARPAL TUNNEL RELEASE;  Surgeon: Onesimo Oneil LABOR, MD;  Location: [facility];  Service: Orthopedics;  Laterality: Right;   CATARACT EXTRACTION W/PHACO Right 11/24/2019   Procedure: CATARACT EXTRACTION PHACO AND INTRAOCULAR LENS PLACEMENT (IOC);  Surgeon: Harrie Agent, MD;  Location: [facility];  Service: Ophthalmology;  Laterality: Right;  CDE: 4.96   CATARACT EXTRACTION W/PHACO Left 07/26/2020   Procedure: CATARACT EXTRACTION PHACO AND INTRAOCULAR LENS PLACEMENT (IOC);  Surgeon: Harrie Agent, MD;  Location: [facility];  Service: Ophthalmology;  Laterality: Left;  CDE: 6.94   COLONOSCOPY   01/27/2005   MFM:Pwuzmwjo hemorrhoids, otherwise normal rectum/Normal colon, marginal prep made the exam more difficult   ESOPHAGOGASTRODUODENOSCOPY   10/12/2004   MFM:Wnmfjo esophagus/Focal area of submucosal petechial hemorrhage in the fundus (likely a trauma-induced phenomenon secondary to vomiting), otherwise normal   LAPAROSCOPIC GASTROTOMY W/ REPAIR OF ULCER     PAROTIDECTOMY Left 07/29/2019   Procedure: LEFT PAROTIDECTOMY;  Surgeon: Karis Clunes, MD;  Location: [facility];  Service: ENT;  Laterality: Left;   TRIGGER FINGER RELEASE Right 08/22/2021   Procedure:  RELEASE TRIGGER FINGER/A-1 PULLEY;  Surgeon: Onesimo Oneil LABOR, MD;  Location: [facility];  Service: Orthopedics;  Laterality: Right;  Right long and ring finger trigger release    Prior to Admission medications  Medication Sig Start Date End Date Taking? Authorizing Provider  amLODipine  (NORVASC ) 10 MG tablet Take 1 tablet (10 mg total) by mouth daily. 07/23/24 10/21/24 Yes Strader, Laymon HERO, PA-C  atorvastatin  (LIPITOR) 40 MG tablet Take 40 mg by mouth daily.   Yes [provider]  JANUVIA 50 MG tablet Take 50 mg by mouth daily. 02/03/22  Yes [provider]  LEVEMIR  FLEXTOUCH 100 UNIT/ML Pen Inject 25 Units into the skin at bedtime.  11/03/19  Yes [provider]  metoprolol  tartrate (LOPRESSOR ) 25 MG tablet Take 1 tablet (25 mg total) by mouth 2 (two) times daily. 03/26/24  Yes Dunlap, Lorette GRADE, PA-C  pantoprazole  (PROTONIX ) 40 MG tablet Take 1 tablet (40 mg total) by mouth daily. 07/15/24  Yes Jiali Linney, Lamar HERO, MD  sodium bicarbonate  650 MG tablet Take 1,300 mg by mouth 2 (two) times daily. 07/01/24  Yes [provider]  tamsulosin (FLOMAX) 0.4 MG CAPS capsule Take 0.8 mg by mouth at bedtime. 02/09/22  Yes [provider]  umeclidinium-vilanterol (ANORO ELLIPTA ) 62.5-25 MCG/ACT AEPB Inhale 1 puff into the lungs daily. 01/24/24  Yes Kassie Acquanetta Bradley, MD  cyclobenzaprine  (FLEXERIL ) 10 MG tablet Take 1 tablet (10 mg total) by mouth 2 (two) times daily as needed for muscle spasms. Patient not taking: Reported on 07/23/2024 10/14/21   Onesimo Oneil LABOR, MD  gabapentin (NEURONTIN) 100 MG capsule Take  100 mg by mouth at bedtime. Patient not taking: Reported on 07/23/2024 12/24/23   [provider]    Allergies as of 07/15/2024   (Not on File)    Family History  Problem Relation Age of Onset   Diabetes Mother    Diabetes Father    Heart attack Maternal Grandfather    Cancer Paternal Grandmother    Heart attack Maternal Uncle    Cancer Maternal Aunt     Cancer Maternal Uncle    Colon cancer Neg Hx    Pancreatic cancer Neg Hx    Stomach cancer Neg Hx     Social History   Socioeconomic History   Marital status: Married    Spouse name: Not on file   Number of children: Not on file   Years of education: Not on file   Highest education level: Not on file  Occupational History   Not on file  Tobacco Use   Smoking status: Every Day    Current packs/day: 1.00    Average packs/day: 1 pack/day for 53.8 years (53.8 ttl pk-yrs)    Types: Cigarettes    Start date: 12/01/1970   Smokeless tobacco: Never   Tobacco comments:    Smokes a little less than 1 pack per day MRC 04/12/22  Vaping Use   Vaping status: Never Used  Substance and Sexual Activity   Alcohol  use: Yes    Alcohol /week: 14.0 standard drinks of alcohol     Types: 14 Glasses of wine per week    Comment: 10/11/21 drinks a bottle of wine a day   Drug use: Not Currently    Types: Marijuana    Comment: occasionally to stimulate appetite   Sexual activity: Not on file  Other Topics Concern   Not on file  Social History Narrative   Not on file   Social Drivers of Health   Tobacco Use: High Risk (09/29/2024)   Patient History    Smoking Tobacco Use: Every Day    Smokeless Tobacco Use: Never    Passive Exposure: Not on file  Financial Resource Strain: Not on file  Food Insecurity: Not on file  Transportation Needs: Not on file  Physical Activity: Not on file  Stress: Not on file  Social Connections: Not on file  Intimate Partner Violence: Not on file  Depression (PHQ2-9): Not on file  Alcohol  Screen: Not on file  Housing: Not on file  Utilities: Not on file  Health Literacy: Not on file    Review of Systems   See HPI, otherwise negative ROS  Physical Exam: BP 119/76   Pulse 89   Temp 98 F (36.7 C) (Oral)   Resp 20   Ht 5' 9 (1.753 m)   Wt 74.4 kg   SpO2 99%   BMI 24.22 kg/m  General:   Alert,  Well-developed, well-nourished, pleasant and cooperative in  NAD Neck:  Supple; no masses or thyromegaly. No significant cervical adenopathy. Lungs:  Clear throughout to auscultation.   No wheezes, crackles, or rhonchi. No acute distress. Heart:  Regular rate and rhythm; no murmurs, clicks, rubs,  or gallops. Abdomen: Non-distended, normal bowel sounds.  Soft and nontender without appreciable mass or hepatosplenomegaly.   Impression/Plan:   73 year old gentleman overdue for average risk screening colonoscopy.  The risks, benefits, limitations, alternatives and imponderables have been reviewed with the patient. Questions have been answered. All parties are agreeable.      Notice: This dictation was prepared with Dragon dictation along with smaller  lobbyist. Any transcriptional errors that result from this process are unintentional and may not be corrected upon review.

## 2024-09-29 NOTE — Anesthesia Postprocedure Evaluation (Signed)
"   Anesthesia Post Note  Patient: Dylan Cline  Procedure(s) Performed: COLONOSCOPY  Patient location during evaluation: PACU Anesthesia Type: General Level of consciousness: awake and alert Pain management: pain level controlled Vital Signs Assessment: post-procedure vital signs reviewed and stable Respiratory status: spontaneous breathing, nonlabored ventilation, respiratory function stable and patient connected to nasal cannula oxygen Cardiovascular status: blood pressure returned to baseline and stable Postop Assessment: no apparent nausea or vomiting Anesthetic complications: no   No notable events documented.   Last Vitals:  Vitals:   09/29/24 0815 09/29/24 0823  BP: 99/78 113/70  Pulse: 80 73  Resp: 18 17  Temp: 36.9 C   SpO2: 96% 95%    Last Pain:  Vitals:   09/29/24 0827  TempSrc:   PainSc: 0-No pain                 Andrea Limes      "

## 2024-09-29 NOTE — Transfer of Care (Signed)
 Immediate Anesthesia Transfer of Care Note  Patient: [name]  Procedure(s) Performed: COLONOSCOPY  Patient Location: PACU and Endoscopy Unit  Anesthesia Type:General  Level of Consciousness: awake  Airway & Oxygen Therapy: Patient Spontanous Breathing  Post-op Assessment: Report given to RN  Post vital signs: Reviewed and stable  Last Vitals:  Vitals Value Taken Time  BP    Temp    Pulse    Resp    SpO2      Last Pain:  Vitals:   09/29/24 0654  TempSrc: Oral  PainSc: 0-No pain      Patients Stated Pain Goal: 6 (09/29/24 0654)  Complications: No notable events documented.

## 2024-09-29 NOTE — Anesthesia Preprocedure Evaluation (Addendum)
"                                    Anesthesia Evaluation  Patient identified by MRN, date of birth, ID band Patient awake    Reviewed: Allergy & Precautions, H&P , NPO status , Patient's Chart, lab work & pertinent test results  Airway Mallampati: II  TM Distance: >3 FB Neck ROM: Full    Dental no notable dental hx.    Pulmonary COPD, Current Smoker and Patient abstained from smoking.   Pulmonary exam normal breath sounds clear to auscultation       Cardiovascular hypertension, Normal cardiovascular exam Rhythm:Regular Rate:Normal     Neuro/Psych  PSYCHIATRIC DISORDERS      negative neurological ROS     GI/Hepatic ,GERD  ,,(+)     substance abuse  alcohol  use  Endo/Other  diabetes    Renal/GU Renal disease  negative genitourinary   Musculoskeletal negative musculoskeletal ROS (+)    Abdominal   Peds negative pediatric ROS (+)  Hematology negative hematology ROS (+)   Anesthesia Other Findings   Reproductive/Obstetrics negative OB ROS                              Anesthesia Physical Anesthesia Plan  ASA: 3  Anesthesia Plan: General   Post-op Pain Management:    Induction: Intravenous  PONV Risk Score and Plan:   Airway Management Planned: Nasal Cannula  Additional Equipment:   Intra-op Plan:   Post-operative Plan:   Informed Consent: I have reviewed the patients History and Physical, chart, labs and discussed the procedure including the risks, benefits and alternatives for the proposed anesthesia with the patient or authorized representative who has indicated his/her understanding and acceptance.     Dental advisory given  Plan Discussed with: CRNA  Anesthesia Plan Comments:          Anesthesia Quick Evaluation  "

## 2024-09-29 NOTE — Op Note (Signed)
 [facility] Patient Name: Dylan Cline Procedure Date: 09/29/2024 7:02 AM MRN: [id number] Date of Birth: Sep 23, 1951 Attending MD: Lamar Ozell Hollingshead , MD, [id number] CSN: [id number] Age: 73 Admit Type: Outpatient Procedure:                Colonoscopy Indications:              Screening for colorectal malignant neoplasm Providers:                Lamar Ozell Hollingshead, MD, Olam Ada, RN, Bascom Blush Referring MD:              Medicines:                Propofol  per Anesthesia Complications:            No immediate complications. Estimated Blood Loss:     Estimated blood loss was minimal. Procedure:                Pre-Anesthesia Assessment:                           - Prior to the procedure, a History and Physical                            was performed, and patient medications and                            allergies were reviewed. The patient's tolerance of                            previous anesthesia was also reviewed. The risks                            and benefits of the procedure and the sedation                            options and risks were discussed with the patient.                            All questions were answered, and informed consent                            was obtained. Prior Anticoagulants: The patient has                            taken no anticoagulant or antiplatelet agents. ASA                            Grade Assessment: III - A patient with severe                            systemic disease. After reviewing the risks and  benefits, the patient was deemed in satisfactory                            condition to undergo the procedure.                           After obtaining informed consent, the colonoscope                            was passed under direct vision. Throughout the                            procedure, the patient's blood pressure, pulse, and                            oxygen  saturations were monitored continuously. The                            [id number]-[id number] ([id number]) Colon was introduced through                            the anus and advanced to the the cecum, identified                            by appendiceal orifice and ileocecal valve. The                            colonoscopy was performed without difficulty. The                            patient tolerated the procedure well. The quality                            of the bowel preparation was adequate. The entire                            colon was well visualized. Scope In: 7:36:26 AM Scope Out: 8:06:36 AM Scope Withdrawal Time: 0 hours 21 minutes 27 seconds  Total Procedure Duration: 0 hours 30 minutes 10 seconds  Findings:      The perianal and digital rectal examinations were normal.      Two sessile polyps were found in the hepatic flexure and cecum. The       polyps were 8 to 20 mm in size. These polyps were removed with a hot       snare. Resection and retrieval were complete. Estimated blood loss was       minimal. The 26 mm polyp was lifted nicely away from the cecal wall with       7 cc of Ella view. Please see photos. This polyp was removed completely       with 2 passes of hot snare. Polyp was recovered with the Roth net       smaller pieces through suction. 8 mm polyp at the hepatic flexure was       removed with hot snare. There were 3 smaller polyps in the cecum and  2       at the hepatic flexure which were cold snare removed.      A large cecal polyp was sealed with 2 Mantis clips and 1 resolution       clip. The large polyp was pulled out with the rescue net. There may have       been a couple small polyps obscured and left behind with removal of the       large polyp with a rescue net.      The exam was otherwise without abnormality on direct and retroflexion       views.      The exam was otherwise without abnormality on direct and retroflexion       views. Impression:                - Two 8 to 20 mm polyps at the hepatic flexure and                            in the cecum, removed with a hot snare. Piecemeal.                            Resected and retrieved. Large cecal polyp injected                            with Toy view and clipped                           - 4 polyps at the hepatic flexure and cecum removed                            with cold snare                           - The examination was otherwise normal on direct                            and retroflexion views. Moderate Sedation:      Moderate (conscious) sedation was personally administered by an       anesthesia professional. The following parameters were monitored: oxygen       saturation, heart rate, blood pressure, respiratory rate, EKG, adequacy       of pulmonary ventilation, and response to care. Recommendation:           - Patient has a contact number available for                            emergencies. The signs and symptoms of potential                            delayed complications were discussed with the                            patient. Return to normal activities tomorrow.                            Written discharge instructions were provided to the  patient.                           - Advance diet as tolerated.                           - Continue present medications.                           - Repeat colonoscopy after studies are complete for                            surveillance.                           - Return to GI office in 6 months. Procedure Code(s):        --- Professional ---                           [phone], Colonoscopy, flexible; with removal of                            tumor(s), polyp(s), or other lesion(s) by snare                            technique Diagnosis Code(s):        --- Professional ---                           Z12.11, Encounter for screening for malignant                            neoplasm of colon                            D12.3, Benign neoplasm of transverse colon (hepatic                            flexure or splenic flexure)                           D12.0, Benign neoplasm of cecum CPT copyright 2022 [employer]. All rights reserved. The codes documented in this report are preliminary and upon coder review may  be revised to meet current compliance requirements. Lamar HERO. Nobel Brar, MD Lamar Ozell Hollingshead, MD 09/29/2024 8:13:37 AM This report has been signed electronically. Number of Addenda: 0

## 2024-09-29 NOTE — Discharge Instructions (Signed)
" °  Colonoscopy Discharge Instructions  Read the instructions outlined below and refer to this sheet in the next few weeks. These discharge instructions provide you with general information on caring for yourself after you leave the hospital. Your doctor may also give you specific instructions. While your treatment has been planned according to the most current medical practices available, unavoidable complications occasionally occur. If you have any problems or questions after discharge, call Dr. Shaaron at [phone]. ACTIVITY You may resume your regular activity, but move at a slower pace for the next 24 hours.  Take frequent rest periods for the next 24 hours.  Walking will help get rid of the air and reduce the bloated feeling in your belly (abdomen).  No driving for 24 hours (because of the medicine (anesthesia) used during the test).   Do not sign any important legal documents or operate any machinery for 24 hours (because of the anesthesia used during the test).  NUTRITION Drink plenty of fluids.  You may resume your normal diet as instructed by your doctor.  Begin with a light meal and progress to your normal diet. Heavy or fried foods are harder to digest and may make you feel sick to your stomach (nauseated).  Avoid alcoholic beverages for 24 hours or as instructed.  MEDICATIONS You may resume your normal medications unless your doctor tells you otherwise.  WHAT YOU CAN EXPECT TODAY Some feelings of bloating in the abdomen.  Passage of more gas than usual.  Spotting of blood in your stool or on the toilet paper.  IF YOU HAD POLYPS REMOVED DURING THE COLONOSCOPY: No aspirin products for 7 days or as instructed.  No alcohol  for 7 days or as instructed.  Eat a soft diet for the next 24 hours.  FINDING OUT THE RESULTS OF YOUR TEST Not all test results are available during your visit. If your test results are not back during the visit, make an appointment with your caregiver to find out the  results. Do not assume everything is normal if you have not heard from your caregiver or the medical facility. It is important for you to follow up on all of your test results.  SEEK IMMEDIATE MEDICAL ATTENTION IF: You have more than a spotting of blood in your stool.  Your belly is swollen (abdominal distention).  You are nauseated or vomiting.  You have a temperature over 101.  You have abdominal pain or discomfort that is severe or gets worse throughout the day.      multiple polyps found and removed from the colon.  1 large colon polyp removed  Further recommendations to follow pending review of pathology report "

## 2024-09-29 NOTE — Anesthesia Postprocedure Evaluation (Signed)
"   Anesthesia Post Note  Patient: Dylan Cline  Procedure(s) Performed: COLONOSCOPY  Patient location during evaluation: Endoscopy Anesthesia Type: General Level of consciousness: awake and alert Pain management: pain level controlled Vital Signs Assessment: post-procedure vital signs reviewed and stable Respiratory status: spontaneous breathing Cardiovascular status: blood pressure returned to baseline and stable Postop Assessment: no apparent nausea or vomiting Anesthetic complications: no   No notable events documented.   Last Vitals:  Vitals:   09/29/24 0815 09/29/24 0823  BP: 99/78 113/70  Pulse: 80 73  Resp: 18 17  Temp: 36.9 C   SpO2: 96% 95%    Last Pain:  Vitals:   09/29/24 0827  TempSrc:   PainSc: 0-No pain                 Chinwe Lope      "

## 2024-09-30 ENCOUNTER — Encounter (HOSPITAL_COMMUNITY): Payer: Self-pay | Admitting: Internal Medicine

## 2024-10-02 ENCOUNTER — Ambulatory Visit: Payer: Self-pay | Admitting: Internal Medicine

## 2024-10-02 LAB — SURGICAL PATHOLOGY

## 2024-10-10 ENCOUNTER — Encounter: Payer: Self-pay | Admitting: *Deleted

## 2024-10-10 NOTE — Progress Notes (Signed)
 PEARLY APACHITO                                          MRN: [id number]   10/10/2024   The [facility] Quality Team Specialist reviewed this patient medical record for the purposes of chart review for care gap closure. The following were reviewed: chart review for care gap closure-diabetic eye exam.    [facility] Quality Team
